# Patient Record
Sex: Female | Born: 1980 | Race: White | Hispanic: Yes | Marital: Married | Smoking: Never smoker
Health system: Southern US, Community
[De-identification: ages and names within clinical notes are randomized; demographics above are authoritative.]

## PROBLEM LIST (undated history)

## (undated) DIAGNOSIS — E039 Hypothyroidism, unspecified: Secondary | ICD-10-CM

## (undated) DIAGNOSIS — I10 Essential (primary) hypertension: Secondary | ICD-10-CM

## (undated) DIAGNOSIS — Z9889 Other specified postprocedural states: Secondary | ICD-10-CM

## (undated) DIAGNOSIS — R519 Headache, unspecified: Secondary | ICD-10-CM

## (undated) DIAGNOSIS — M549 Dorsalgia, unspecified: Secondary | ICD-10-CM

## (undated) DIAGNOSIS — D179 Benign lipomatous neoplasm, unspecified: Secondary | ICD-10-CM

## (undated) DIAGNOSIS — R112 Nausea with vomiting, unspecified: Secondary | ICD-10-CM

## (undated) DIAGNOSIS — M5126 Other intervertebral disc displacement, lumbar region: Secondary | ICD-10-CM

## (undated) DIAGNOSIS — O09529 Supervision of elderly multigravida, unspecified trimester: Secondary | ICD-10-CM

## (undated) DIAGNOSIS — E282 Polycystic ovarian syndrome: Secondary | ICD-10-CM

## (undated) DIAGNOSIS — Z1589 Genetic susceptibility to other disease: Secondary | ICD-10-CM

## (undated) DIAGNOSIS — E079 Disorder of thyroid, unspecified: Secondary | ICD-10-CM

## (undated) DIAGNOSIS — E7212 Methylenetetrahydrofolate reductase deficiency: Secondary | ICD-10-CM

## (undated) HISTORY — DX: Essential (primary) hypertension: I10

## (undated) HISTORY — DX: Dorsalgia, unspecified: M54.9

## (undated) HISTORY — DX: Hypothyroidism, unspecified: E03.9

## (undated) HISTORY — PX: BREAST BIOPSY: SHX20

## (undated) HISTORY — DX: Benign lipomatous neoplasm, unspecified: D17.9

## (undated) HISTORY — PX: CHOLECYSTECTOMY: SHX55

## (undated) HISTORY — DX: Headache, unspecified: R51.9

## (undated) HISTORY — DX: Nausea with vomiting, unspecified: R11.2

## (undated) HISTORY — PX: PR MASTECTOMY PARTIAL: 19301

## (undated) HISTORY — DX: Other specified postprocedural states: Z98.890

## (undated) HISTORY — PX: ABDOMINAL SURGERY: SHX537

## (undated) HISTORY — DX: Other intervertebral disc displacement, lumbar region: M51.26

## (undated) HISTORY — DX: Genetic susceptibility to other disease: Z15.89

## (undated) HISTORY — DX: Polycystic ovarian syndrome: E28.2

## (undated) HISTORY — DX: Methylenetetrahydrofolate reductase deficiency: E72.12

## (undated) NOTE — ED Provider Notes (Signed)
Formatting of this note is different from the original.  Images from the original note were not included.     [facility] EMERGENCY DEPARTMENT     Chief Complaint   Abdominal Pain (RUQ - started 24 hrs ago (gets worse after eating))      History of Present Illness   Peggy Kennedy is a 65 y.o. female who presents to the ED with a chief complaint of right upper quadrant abdominal pain that started yesterday after eating.  Patient states her pain was fairly constant throughout the day with some associated nausea vomiting patient did have some episode of loose stool as well.  Patient was today after eating about 5 hours prior to arrival to ED pain got much worse and came to ED for further evaluation patient states that gallbladder disease does run in her family no other pain or complaints.     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  Social History     Social History Narrative    Not on file     Social History     Tobacco Use    Smoking status: Never    Smokeless tobacco: Never   Substance Use Topics    Alcohol use: Not Currently    Drug use: Never       Review of Systems   Review of Systems   Constitutional:  Negative for chills and fever.   HENT:  Negative for congestion and sore throat.    Respiratory:  Negative for cough and shortness of breath.    Cardiovascular:  Negative for chest pain and palpitations.   Gastrointestinal:  Positive for abdominal pain, nausea and vomiting.   Genitourinary:  Negative for dysuria and frequency.   Musculoskeletal:  Negative for back pain.   Neurological:  Negative for dizziness and weakness.   All other systems reviewed and are negative.      Physical Exam   Vitals:    08/08/22 2041   BP: (!) 168/107   Patient Position: Sitting   Pulse: 77   Resp: 19   Temp: 36.6 C (97.8 F)    TempSrc: Skin   SpO2: 99%   Weight: 65.8 kg (145 lb)   Height: 1.727 m (5\' 8" )     Physical Exam  Vitals and nursing note reviewed.   Constitutional:       General: She is not in acute distress.  HENT:      Head: Normocephalic and atraumatic.      Right Ear: External ear normal.      Left Ear: External ear normal.      Nose: Nose normal.   Eyes:      Extraocular Movements: Extraocular movements intact.      Conjunctiva/sclera: Conjunctivae normal.   Cardiovascular:      Rate and Rhythm: Normal rate and regular rhythm.   Pulmonary:      Effort: Pulmonary effort is normal. No respiratory distress.   Abdominal:      General: Abdomen is flat. There is no distension.      Tenderness: There is abdominal tenderness.      Comments: Right upper quadrant tenderness to palpation   Musculoskeletal:  General: No signs of injury. Normal range of motion.      Cervical back: Normal range of motion.   Skin:     General: Skin is warm.   Neurological:      General: No focal deficit present.      Mental Status: She is alert.      Coordination: Coordination normal.         Procedures   Procedures  No notes on file      Medical Decision Making   LABORATORY EVALUATION:  Results for orders placed or performed during the hospital encounter of 08/08/22   CBC with Differential   Result Value Ref Range    WBC 8.2 3.7 - 11.0 thou/mcL    RBC 4.26 4.01 - 4.90 million/mcL    Hemoglobin 12.2 (L) 12.4 - 14.9 G/DL    Hematocrit 16.1 09.6 - 47.9 %    MCV 84.0 (L) 87.0 - 98.0 FL    MCH 28.6 27.0 - 33.0 PG    MCHC 34.1 31.0 - 37.0 gm/dL    Platelets 045 409 - 400 K/UL    Neutrophils Percent 65.5 50.0 - 70.0 %    Absolute Neutrophil Count 5.35 1.50 - 7.50 thou/mcL    RDW-SD 40.1 36 - 47 fL    MPV 10.4 8.9 - 11.0 FL    Nucleated RBC Percent 0 0 %    Nucleated RBC Abs 0.000   K/UL    Lymphocytes Percent 27.7 25.0 - 40.0 %    Monocytes Percent 5.5 4.0 - 12.0 %    Eosinophils Percent 0.7 (L) 1.0 - 6.0 %    Basophils Percent 0.4 0.0 - 2.0 %     Lymphocytes Abs 2.3 thou/mcL    Eosinophils Absolute 0.06 0.00 - 0.50 K/UL    Monos Abs 0.5 0.1 - 0.8 thou/mcL    Basophils Absolute 0.0 0.0 - 0.2 thou/mcL    Ig, Absolute 0.020 0.001 - 0.031 thou/mcL    Ig, Percent 0.20 0.01 - 0.42 %   Comprehensive metabolic panel   Result Value Ref Range    Sodium 140 136 - 145 mmol/L    Potassium 3.5 3.4 - 4.8 mmol/L    Chloride 109 (H) 98 - 107 MMOL/L    CO2 22 22 - 29 MMOL/L    Bun 10 7 - 19 mg/dL    Creatinine 0.7 0.6 - 1.1 mg/dL    Glucose 93 74 - 99 mg/dL    Calcium 9.1 8.5 - 81.1 mg/dL    Total Protein 6.5 6.0 - 8.3 g/dL    Albumin 3.7 3.2 - 5.2 g/dL    Total Bilirubin 0.2 (L) 0.3 - 1.2 MG/DL    Alk Phos 41 40 - 914 U/L    Ast 15 <35 U/L    Alt 12 <55 U/L    Anion Gap 9 5 - 15 mmol/L    est GFRcr 110 >=60 mL/min/1.40m2   Urinalysis, with reflex culture (Urine, Midstream, Clean Catch)    Specimen: Urine, Midstream, Clean Catch   Result Value Ref Range    Color Urine Yellow Yellow    Appearance Clear Clear    Specific Gravity Urine 1.025 1.005 - 1.030    PH Urine 6.0 5.0 - 8.0    Protein Urine Trace (A) Negative mg/dL    Glucose UA Negative Negative mg/dL    Ketone Trace (A) Negative    Bilirubin Urine Negative Negative    Blood Urine Negative Negative    Nitrite  Negative Negative    Urobilinogen 1.0 0.2 - 1.0 EU    Leukocytes Esterase Negative Negative   Lipase   Result Value Ref Range    Lipase 56 8 - 78 U/L   Urine HCG Qualitative   Result Value Ref Range    Urine Qualitative HCG Negative Negative, See note     IMAGING:  US GALLBLADDER    (Results Pending)     US GALLBLADDER    (Results Pending)     MEDICATIONS RECEIVED IN ED:  Medications - No data to display    MDM:  History as above patient normal vital signs slightly hypertensive otherwise nontoxic no acute distress abdomen is soft tenderness to palpation in the right upper quadrant positive Murphy sign.  Patient was having some postprandial abdominal pain with some nausea and vomiting.  History of gallbladder  disease.  Will get CBC BMP check for leukocytosis kidney dysfunction LFT the patient biliary dysfunction ultrasound right upper quadrant will reassess.     ED Course and Updates   ED Course as of 08/09/22 0123   Sat Aug 08, 2022   2204 EKG:  02/28/2006: Normal sinus rhythm rate of 75 normal interval no ischemic changes [TT]   Sun Aug 09, 2022   0122 Patient left prior to ultrasound resulting, however patient does states she feels much better states she will be able to follow up on her MyChart and will be able to go over the results with her primary doctor also referral to General surgery included case needed.  Patient states she feels much better at this point I think it is reasonable to go okay [place] close PCP follow-up next days return to ED with any new worsening symptoms [TT]     ED Course User Index  [TT] Apolinar Junes, DO       Disposition and Diagnosis   DISPOSITION:  Discharged [1]    DIAGNOSIS:  1. Abdominal pain, right upper quadrant    2. Biliary colic      MEDICATIONS:  New Prescriptions    No medications on file     FOLLOW-UP:  Mariann Laster  [address]  [address] [place] [address]  [phone]    Call in 1 [place]  [address] [facility]  [address]  [place] [address]  [phone]    Call in 1 day        Attestation   Electronically signed by Apolinar Junes, DO.       Apolinar Junes, DO  08/09/22 0123    Electronically signed by Apolinar Junes, DO at 08/09/2022  1:23 AM EDT

## (undated) NOTE — ED Notes (Signed)
Formatting of this note might be different from the original.  Pt refusing vitals. She states that she is just ready to leave.     Sinclair Ship, RN  08/29/16 2116    Electronically signed by Sinclair Ship, RN at 08/29/2016  9:16 PM EDT

## (undated) NOTE — ED Notes (Signed)
Formatting of this note might be different from the original.  Patient expressing wanting to leave hospital. MD consulted and states will go see patient to discuss staying for [place] results or signing out AMA.  Electronically signed by Katharine Look, RN at 08/09/2022  1:13 AM EDT

## (undated) NOTE — ED Provider Notes (Signed)
Formatting of this note is different from the original.  eMERGENCY dEPARTMENT eNCOUnter      CHIEF COMPLAINT    Chief Complaint   Patient presents with   ? Vaginal Bleeding-Pregnant     pt is [redacted] weeks pregnant c/o vaginal bleeding     HPI    Peggy Kennedy is a 7 y.o. female who presents with Vaginal bleeding onset today. Patient is currently [redacted] weeks pregnant has had ultrasound to this 2 days ago which was normal is currently on Lovenox for deep vein thrombosis. Patient denies any abdominal cramping denies any fever or chills diarrhea no constipation denies any recent medication changes.    PAST MEDICAL HISTORY    Past Medical History:   Diagnosis Date   ? DVT (deep vein thrombosis) in pregnancy [place])    ? Thyroid disease      SURGICAL HISTORY    History reviewed. No pertinent surgical history.    CURRENT MEDICATIONS    No current facility-administered medications for this encounter.   No current outpatient prescriptions on file.    ALLERGIES    No Known Allergies    FAMILY HISTORY    No family history on file.    SOCIAL HISTORY    Social History     Social History   ? Marital status: N/A     Spouse name: N/A   ? Number of children: N/A   ? Years of education: N/A     Social History Main Topics   ? Smoking status: Never Smoker   ? Smokeless tobacco: None   ? Alcohol use No   ? Drug use: No   ? Sexual activity: Not Asked     Other Topics Concern   ? None     Social History Narrative   ? None     REVIEW OF SYSTEMS    Constitutional:  Denies fever, chills, weight loss or weakness.    Eyes:  Denies photophobia or discharge.    HENT:  Denies sore throat or ear pain.    Respiratory:  Denies cough or shortness of breath.    Cardiovascular:  Denies chest pain, palpitations or swelling.    GI:  Denies abdominal pain, nausea, vomiting, or diarrhea.    Musculoskeletal:  Denies back pain.    Skin:  Denies rash.    Neurologic:  Denies headache, focal weakness or sensory changes.    Endocrine:  Denies polyuria or polydipsia.     Lymphatic:  Denies swollen glands.    See HPI for further details.    PHYSICAL EXAM    VITAL SIGNS: There were no vitals taken for this visit.  Constitutional:  Well developed, well nourished, no acute distress, non-toxic appearance   Eyes:  PERRL, conjunctiva normal   HENT:  Atraumatic, external ears normal, nose normal, oropharynx moist, no pharyngeal exudates. Neck- normal range of motion, no tenderness, supple   Respiratory:  No respiratory distress, normal breath sounds, no rales, no wheezing   Cardiovascular:  Normal rate, normal rhythm, no murmurs, no gallops, no rubs   GI:  Soft, nondistended, normal bowel sounds, nontender, no organomegaly, no mass, no rebound, no guarding   GU:  No costovertebral angle tenderness   Musculoskeletal:  No edema, no tenderness, no deformities. Back- no tenderness  Integument:  Well hydrated, no rash   Lymphatic:  No lymphadenopathy noted   Neurologic:  Alert & oriented x 3, CN 2-12 normal, normal motor function, normal sensory function, no focal deficits noted  Psychiatric:  Speech and behavior anxious     ED COURSE & MEDICAL DECISION MAKING    Pertinent labs & imaging studies reviewed. (See chart for details)  Recent Results (from the past 10 hour(s))   CBC with Differential    Collection Time: 08/29/16  7:37 PM   Result Value Ref Range    WBC Count 10.8 (H) 4.0 - 10.0 K/uL    RBC Count 4.55 3.93 - 5.22 M/uL    Hemoglobin 12.8 11.2 - 15.7 g/dL    Hematocrit 01.0 27.2 - 44.9 %    MCV 85.5 79.4 - 94.8 fL    MCH 28.1 25.6 - 32.2 pg    MCHC 32.9 32.2 - 36.5 g/dL    RDW 53.6 64.4 - 03.4 %    Platelets 227 163 - 369 K/uL    MPV 10.2 9.4 - 12.4 fL    Abs NRBC 0.01 0.00 - 0.01 K/uL    Neutrophils 66.5 %    Lymphs 25.7 %    Monocytes 6.1 %    Eosinophils 0.7 %    Basophils 0.3 %    Immature Grans 0.7 %    Nucleated RBC 0.0 0.0 - 0.2 per 100 WBC's    Abs Neutrophils 7.16 (H) 1.60 - 6.10 K/uL    Abs Lymphs 2.77 1.20 - 3.70 K/uL    Abs Monos 0.66 0.20 - 0.80 K/uL    Abs EOS 0.07 0.00  - 0.50 K/uL    Abs Baso 0.03 0.00 - 0.10 K/uL    Abs Immature Grans 0.07 (H) 0.00 - 0.03 K/uL    ANC (Auto) 7.16 K/uL    Diff Type AUTO    Basic Metabolic Profile (BMP): Glucose, BUN, CO2-, Na+, K+, Cl-, Ca+, SCr, Anion Gap    Collection Time: 08/29/16  7:37 PM   Result Value Ref Range    Sodium 141 136 - 145 mmol/L    Potassium 3.9 3.5 - 5.1 mmol/L    Chloride 104 98 - 107 mmol/L    CO2 25 21 - 32 mmol/L    Anion Gap 12     Creatinine 0.72 0.55 - 1.02 mg/dL    GFR, Estimated >74 QV/ZDG/3.87F6    BUN 12 7 - 18 mg/dL    Glucose 79 74 - 433 mg/dL    Calcium 9.3 8.5 - 29.5 mg/dL   ABO Grouping and Rh Typing    Collection Time: 08/29/16  7:37 PM   Result Value Ref Range    ABORh A Positive    PT/INR    Collection Time: 08/29/16  7:37 PM   Result Value Ref Range    Prothrombin Time <9.9 (L) 9.9 - 11.8 Seconds    INR <0.94 (L) 0.94 - 1.12   (HCG), Urine, Qualitative - [address] ED Only    Collection Time: 08/29/16  7:37 PM   Result Value Ref Range    Pregnancy Urine Positive (A) Negative   Urinalysis, Reflex with Scope    Collection Time: 08/29/16  7:37 PM   Result Value Ref Range    Color, Ur Straw (A) Colorless,Light Yellow,Yellow    Clarity, Ur Clear Clear    Glucose, Ur Negative Negative mg/dL    Bilirubin, Ur Negative Negative    Ketones, Ur Negative Negative mg/dL    Urine Specific Gravity 1.014 1.001 - 1.035    Blood, UA Moderate (A) Negative    pH, Ur 7.0 5.0 - 9.0    Protein, Ur Negative Negative mg/dL  Urine Urobilinogen <2.0 <2.0,1.0,0.2 mg/dL    Nitrite, Ur Negative Negative    Leukocytes, Urine Negative Negative    RBCs 21 (H) 0 - 3 /HPF    Leukocytes 2 0 - 3 /HPF    Sq. Epith. Cells 1 0 - 5 /HPF    Mucus Rare (A) None Seen /LPF     Ultrasound Ob, Limited With Tv    Result Date: 08/29/2016  OBSTETRICAL ULTRASOUND: COMPARISON:None Transabdominal and transvaginal imaging was performed. A single living intrauterine pregnancy is identified. Based on crown-rump length the estimated age is 13 weeks 3 days +/- 1  week 1 day. This gives a estimated dated delivery 03/03/2017. Fetal heart activity is documented at 165 BPM. There is a large subchorionic hemorrhage measuring 7.9 x 2.4 x 6.6 cm. Neither ovary is identified. No free fluid is seen.     IMPRESSION: SINGLE  INTRAUTERINE PREGNANCY AS DETAILED ABOVE Dictated By: Salomon Mast, MD 08/29/2016 8:21 PM  Electronically Signed by: Salomon Mast, MD 08/29/2016 8:21 PM    FINAL IMPRESSION    Subchorionic hemorrhage  Threatened miscarriage    Patient is currently on Lovenox I talked with her OB/GYN office Dr. Tawanna Cooler Mesinger who is comfortable with the patient continuing on Lovenox at her current dose which is 40 mg per day. I talked to the patient about on medication including bleeding precautions and advised at this time follow her back up on Monday at her OB/GYN office.    Portions of this note may be dictated using Dragon Naturally Speaking voice recognition software. Variances in spelling and vocabulary are possible and unintentional. Not all errors are caught/corrected. Please notify the There[facility] Parkin if any discrepancies are noted or if the meaning of any statement is not clear.        Leone Brand, DO  08/29/16 2056    Electronically signed by Leone Brand, DO at 08/29/2016  8:56 PM EDT

---

## 2008-02-10 DIAGNOSIS — E039 Hypothyroidism, unspecified: Secondary | ICD-10-CM

## 2008-02-10 HISTORY — DX: Hypothyroidism, unspecified: E03.9

## 2012-01-28 DIAGNOSIS — M5126 Other intervertebral disc displacement, lumbar region: Secondary | ICD-10-CM | POA: Insufficient documentation

## 2014-11-26 ENCOUNTER — Encounter (HOSPITAL_COMMUNITY): Payer: Self-pay | Admitting: Emergency Medicine

## 2014-11-26 ENCOUNTER — Emergency Department (HOSPITAL_COMMUNITY)
Admission: EM | Admit: 2014-11-26 | Discharge: 2014-11-26 | Disposition: A | Discharge status: Home / Self Care | Payer: 59 | Attending: Emergency Medicine | Admitting: Emergency Medicine

## 2014-11-26 DIAGNOSIS — Y9289 Other specified places as the place of occurrence of the external cause: Secondary | ICD-10-CM | POA: Insufficient documentation

## 2014-11-26 DIAGNOSIS — Y9389 Activity, other specified: Secondary | ICD-10-CM | POA: Diagnosis not present

## 2014-11-26 DIAGNOSIS — S6992XA Unspecified injury of left wrist, hand and finger(s), initial encounter: Secondary | ICD-10-CM | POA: Diagnosis present

## 2014-11-26 DIAGNOSIS — Z8639 Personal history of other endocrine, nutritional and metabolic disease: Secondary | ICD-10-CM | POA: Diagnosis not present

## 2014-11-26 DIAGNOSIS — S61217A Laceration without foreign body of left little finger without damage to nail, initial encounter: Secondary | ICD-10-CM | POA: Insufficient documentation

## 2014-11-26 DIAGNOSIS — W260XXA Contact with knife, initial encounter: Secondary | ICD-10-CM | POA: Insufficient documentation

## 2014-11-26 DIAGNOSIS — Z23 Encounter for immunization: Secondary | ICD-10-CM | POA: Insufficient documentation

## 2014-11-26 DIAGNOSIS — Y999 Unspecified external cause status: Secondary | ICD-10-CM | POA: Diagnosis not present

## 2014-11-26 DIAGNOSIS — IMO0002 Reserved for concepts with insufficient information to code with codable children: Secondary | ICD-10-CM

## 2014-11-26 HISTORY — DX: Disorder of thyroid, unspecified: E07.9

## 2014-11-26 MED ORDER — TETANUS-DIPHTH-ACELL PERTUSSIS 5-2.5-18.5 LF-MCG/0.5 IM SUSP
0.5000 mL | Freq: Once | INTRAMUSCULAR | Status: AC
  Administered 2014-11-26: 0.5 mL via INTRAMUSCULAR
  Filled 2014-11-26: qty 0.5

## 2014-11-26 MED ORDER — LIDOCAINE HCL 2 % IJ SOLN
15.0000 mL | Freq: Once | INTRAMUSCULAR | Status: AC
  Administered 2014-11-26: 15 mL via INTRADERMAL
  Filled 2014-11-26: qty 20

## 2014-11-26 MED ORDER — BACITRACIN ZINC 500 UNIT/GM EX OINT
1.0000 "application " | TOPICAL_OINTMENT | Freq: Two times a day (BID) | CUTANEOUS | Status: DC
  Administered 2014-11-26: 1 via TOPICAL

## 2014-11-26 NOTE — ED Notes (Signed)
PA at bedside.

## 2014-11-26 NOTE — ED Notes (Signed)
Pt reports she cut in between ring finger and pinky finger with knife. Bleeding controlled.

## 2014-11-26 NOTE — ED Provider Notes (Signed)
CSN: [id number]     Arrival date & time 11/26/14  2026 History  By signing my name below, I, Abigail Kelley, attest that this documentation has been prepared under the direction and in the presence of Abigail Locket, PA-C Electronically Signed: Erling Kelley, ED Scribe. 11/27/2014. 11:14 AM.    Chief Complaint  Patient presents with  . Extremity Laceration    The history is provided by the patient. No language interpreter was used.    HPI Comments: Abigail Kelley is a 34 y.o. female who presents to the Emergency Department complaining of a laceration between her left 4th and 5th finger by mechanism of knife that occurred 45 minutes. She reports associated 8/10 pain to the site of the wound. The bleeding is currently controlled. She states she washed the area with water and peroxide after the injury. Pt is not currently on any anticoagulants. She did not take any meds PTA. Pt is unsure of her tetanus vaccination status. She denies any other complaints at this time.   Past Medical History  Diagnosis Date  . Thyroid disease     hypothyroid.    Past Surgical History  Procedure Laterality Date  . Cesarean section     No family history on file. Social History  Substance Use Topics  . Smoking status: Never Smoker   . Smokeless tobacco: None  . Alcohol Use: No   OB History    No data available     Review of Systems  Skin: Positive for wound.  All other systems reviewed and are negative.     Allergies  Review of patient's allergies indicates no known allergies.  Home Medications   Prior to Admission medications   Not on File   Triage Vitals: BP 140/106 mmHg  Pulse 79  Temp(Src) 98.8 F (37.1 C) (Oral)  Resp 14  Ht 5\' 9"  (1.753 m)  Wt 156 lb 7 oz (70.96 kg)  BMI 23.09 kg/m2  SpO2 98%  LMP 11/14/2014  Physical Exam  Constitutional: She is oriented to person, place, and time. She appears well-developed and well-nourished. No distress.  Awake, alert,  nontoxic appearance.  HENT:  Head: Normocephalic and atraumatic.  Eyes: Conjunctivae and EOM are normal. Right eye exhibits no discharge. Left eye exhibits no discharge.  Neck: Neck supple. No tracheal deviation present.  Cardiovascular: Normal rate and intact distal pulses.   Pulmonary/Chest: Effort normal. No respiratory distress. She exhibits no tenderness.  Abdominal: Soft. There is no tenderness. There is no rebound.  Musculoskeletal: Normal range of motion. She exhibits no tenderness.  Baseline ROM, no obvious new focal weakness.  Neurological: She is alert and oriented to person, place, and time.  Mental status and motor strength appears baseline for patient and situation. Sensation intact  Skin: Skin is warm and dry. No rash noted.  1 cm linear laceration left pinky finger, medial aspect proximal base of MCP. Space bw ring and little fingers. Hemostasis achieved PTA. Pulses intact, brisk cap refill.  Psychiatric: She has a normal mood and affect. Her behavior is normal.  Nursing note and vitals reviewed.   ED Course  Procedures (including critical care time)  DIAGNOSTIC STUDIES: Oxygen Saturation is 98% on RA, normal by my interpretation.    COORDINATION OF CARE:  9:06 PM- will order tdap booster injection.  Pt advised of plan for treatment and pt agrees.  9:22 PM LACERATION REPAIR PROCEDURE NOTE The patient's identification was confirmed and consent was obtained. This procedure was performed by Abigail Locket, PA-C  at 9:19 PM. Site: linear laceration left pinky finger, medial aspect proximal base of MCP Sterile procedures observed Anesthetic used (type and amt): 2% lidocaine w/o epi Suture type/size:4-0 prolene Length:1 cm # of Sutures: 2 Technique: simple interrupted Complexity: compled Antibx ointment applied Tetanus ordered Site anesthetized, irrigated with NS, explored without evidence of foreign body, wound well approximated, site covered with dry,  sterile dressing.  Patient tolerated procedure well without complications. Instructions for care discussed verbally and patient provided with additional written instructions for homecare and f/u.  NERVE BLOCK Performed by: Abigail Kelley Consent: Verbal consent obtained. Required items: required blood products, implants, devices, and special equipment available Time out: Immediately prior to procedure a "time out" was called to verify the correct patient, procedure, equipment, support staff and site/side marked as required.  Indication: Pain  Nerve block body site: Left hand digit   Preparation: Patient was prepped and draped in the usual sterile fashion. Needle gauge: 24 G Location technique: anatomical landmarks  Local anesthetic: Lidocaine   Anesthetic total: 4 ml  Outcome: pain improved Patient tolerance: Patient tolerated the procedure well with no immediate complications.    Labs Review Labs Reviewed - No data to display  Imaging Review No results found. I have personally reviewed and evaluated these images and lab results as part of my medical decision-making.   EKG Interpretation None     Filed Vitals:   11/26/14 2036 11/26/14 2145  BP: 140/106 128/91  Pulse: 79 77  Temp: 98.8 F (37.1 C) 98.9 F (37.2 C)  TempSrc: Oral Oral  Resp: 14 18  Height: 5\' 9"  (1.753 m)   Weight: 156 lb 7 oz (70.96 kg)   SpO2: 98% 99%    MDM  Abigail Kelley is a 34 y.o. female presents for lac to L pinky finger/web space occurred 2 hrs ago. Tdap updated in ED. NVI. FROM of all digits. Brisk cap refill. Lac repaired at bedside after irrigation, topical abx and dressing. Return in 10-14 days for suture removal. Hygiene and return precautions given. Pt appears well, nontoxic with normal VS. Appropriate for DC. F/u PCP as needed. Final diagnoses:  Laceration    I personally performed the services described in this documentation, which was scribed in my presence. The  recorded information has been reviewed and is accurate.    Abigail Locket, PA-C 11/27/14 [place], MD 11/27/14 [phone]

## 2014-11-26 NOTE — Discharge Instructions (Signed)
Please return to ED in 10-14 days and ordered have sutures removed. Return sooner for any sign of infection including fevers, chills, worsening tenderness in the area, increased redness or swelling, cloudy discharge.

## 2014-12-09 ENCOUNTER — Emergency Department (HOSPITAL_COMMUNITY): Admission: EM | Admit: 2014-12-09 | Discharge: 2014-12-09 | Discharge status: Absent without leave | Payer: 59

## 2015-03-28 ENCOUNTER — Encounter: Payer: Self-pay | Admitting: Obstetrics and Gynecology

## 2015-04-05 ENCOUNTER — Encounter: Payer: Self-pay | Admitting: Obstetrics and Gynecology

## 2015-04-11 ENCOUNTER — Ambulatory Visit (INDEPENDENT_AMBULATORY_CARE_PROVIDER_SITE_OTHER): Payer: 59 | Admitting: Obstetrics and Gynecology

## 2015-04-11 ENCOUNTER — Encounter: Payer: Self-pay | Admitting: Obstetrics and Gynecology

## 2015-04-11 VITALS — BP 116/74 | HR 76 | Resp 16 | Ht 67.0 in | Wt 154.0 lb

## 2015-04-11 DIAGNOSIS — Z0000 Encounter for general adult medical examination without abnormal findings: Secondary | ICD-10-CM

## 2015-04-11 DIAGNOSIS — E538 Deficiency of other specified B group vitamins: Secondary | ICD-10-CM

## 2015-04-11 DIAGNOSIS — R7989 Other specified abnormal findings of blood chemistry: Secondary | ICD-10-CM

## 2015-04-11 DIAGNOSIS — Z862 Personal history of diseases of the blood and blood-forming organs and certain disorders involving the immune mechanism: Secondary | ICD-10-CM

## 2015-04-11 DIAGNOSIS — Z1589 Genetic susceptibility to other disease: Secondary | ICD-10-CM

## 2015-04-11 DIAGNOSIS — Z01419 Encounter for gynecological examination (general) (routine) without abnormal findings: Secondary | ICD-10-CM | POA: Diagnosis not present

## 2015-04-11 LAB — COMPREHENSIVE METABOLIC PANEL
ALBUMIN: 3.9 g/dL (ref 3.6–5.1)
ALK PHOS: 34 U/L (ref 33–115)
ALT: 24 U/L (ref 6–29)
AST: 20 U/L (ref 10–30)
BILIRUBIN TOTAL: 0.5 mg/dL (ref 0.2–1.2)
BUN: 9 mg/dL (ref 7–25)
CALCIUM: 9.1 mg/dL (ref 8.6–10.2)
CO2: 24 mmol/L (ref 20–31)
Chloride: 104 mmol/L (ref 98–110)
Creat: 0.86 mg/dL (ref 0.50–1.10)
GLUCOSE: 73 mg/dL (ref 65–99)
Potassium: 4 mmol/L (ref 3.5–5.3)
Sodium: 139 mmol/L (ref 135–146)
TOTAL PROTEIN: 6.9 g/dL (ref 6.1–8.1)

## 2015-04-11 LAB — POCT URINALYSIS DIPSTICK
Bilirubin, UA: NEGATIVE
Glucose, UA: NEGATIVE
Ketones, UA: NEGATIVE
Leukocytes, UA: NEGATIVE
NITRITE UA: NEGATIVE
PH UA: 5
Protein, UA: NEGATIVE
RBC UA: NEGATIVE
UROBILINOGEN UA: NEGATIVE

## 2015-04-11 LAB — CBC
HEMATOCRIT: 39.2 % (ref 36.0–46.0)
HEMOGLOBIN: 13.3 g/dL (ref 12.0–15.0)
MCH: 28.6 pg (ref 26.0–34.0)
MCHC: 33.9 g/dL (ref 30.0–36.0)
MCV: 84.3 fL (ref 78.0–100.0)
MPV: 10.6 fL (ref 8.6–12.4)
Platelets: 198 10*3/uL (ref 150–400)
RBC: 4.65 MIL/uL (ref 3.87–5.11)
RDW: 13.1 % (ref 11.5–15.5)
WBC: 5.7 10*3/uL (ref 4.0–10.5)

## 2015-04-11 LAB — THYROID PANEL WITH TSH
Free Thyroxine Index: 4.7 — ABNORMAL HIGH (ref 1.4–3.8)
T3 UPTAKE: 30 % (ref 22–35)
T4 TOTAL: 15.6 ug/dL — AB (ref 4.5–12.0)
TSH: 0.13 mIU/L — ABNORMAL LOW

## 2015-04-11 LAB — VITAMIN B12: Vitamin B-12: 451 pg/mL (ref 200–1100)

## 2015-04-11 LAB — HEMOGLOBIN A1C
HEMOGLOBIN A1C: 5.1 % (ref <5.7)
Mean Plasma Glucose: 100 mg/dL (ref <117)

## 2015-04-11 LAB — LIPID PANEL
CHOL/HDL RATIO: 3.2 ratio (ref <=5.0)
CHOLESTEROL: 127 mg/dL (ref 125–200)
HDL: 40 mg/dL — ABNORMAL LOW (ref >=46)
LDL Cholesterol: 68 mg/dL (ref <130)
Triglycerides: 97 mg/dL (ref <150)
VLDL: 19 mg/dL (ref <30)

## 2015-04-11 NOTE — Progress Notes (Signed)
35 y.o. G59P1020 Married Caucasian female here for annual exam.    Wants blood work done.  Hx hypothyroidism and low vit B12.   Told she had PCOS. Had irregular menses and acne. These are controlled on Yaz.  Works for [employer] originally.  Husband and daughter here also.  Daughter Verdis Frederickson 10 years old. Husband is Nature conservation officer and does humanitarian projects.   Did IVF for her pregnancy.  Is heterozygous for MTHFR mutation.   PCP:   None.  Patient's last menstrual period was 04/01/2015 (exact date).          Sexually active: Yes.    The current method of family planning is Yasmin.    Exercising: Yes.    Home exercise routine includes weights, cardio. Smoker:  no  Health Maintenance: Pap:  July 2016 (normal and negative HR HPV - Done in [place]) History of abnormal Pap:  No.   MMG:  July 2016 TDaP:  October 2016 Screening Labs:  urine dip - negative.   Urine today: negative.   reports that she has never smoked. She has never used smokeless tobacco. She reports that she does not drink alcohol or use illicit drugs.  Past Medical History  Diagnosis Date  . Thyroid disease     hypothyroid.   Marland Kitchen PCOS (polycystic ovarian syndrome)   . Herniated cervical disc   . MTHFR mutation [facility])     Past Surgical History  Procedure Laterality Date  . Cesarean section      Current Outpatient Prescriptions  Medication Sig Dispense Refill  . drospirenone-ethinyl estradiol (YASMIN,ZARAH,SYEDA) 3-0.03 MG tablet Take 1 tablet by mouth daily.    Marland Kitchen levothyroxine (SYNTHROID, LEVOTHROID) 125 MCG tablet Take 125 mcg by mouth daily before breakfast.     No current facility-administered medications for this visit.    Family History  Problem Relation Age of Onset  . Hypertension Father   . Breast cancer Maternal Aunt   . Alzheimer's disease Paternal Grandmother     ROS:  Pertinent items are noted in HPI.  Otherwise, a comprehensive ROS was negative.  Exam:   BP 116/74 mmHg  Pulse 76   Resp 16  Ht 5\' 7"  (1.702 m)  Wt 154 lb (69.854 kg)  BMI 24.11 kg/m2  LMP 04/01/2015 (Exact Date)    General appearance: alert, cooperative and appears stated age Head: Normocephalic, without obvious abnormality, atraumatic Neck: no adenopathy, supple, symmetrical, trachea midline and thyroid normal to inspection and palpation Lungs: clear to auscultation bilaterally Breasts: normal appearance, no masses or tenderness, Inspection negative, No nipple retraction or dimpling, No nipple discharge or bleeding, No axillary or supraclavicular adenopathy Heart: regular rate and rhythm Abdomen: soft, non-tender; bowel sounds normal; no masses,  no organomegaly Extremities: extremities normal, atraumatic, no cyanosis or edema Skin: Skin color, texture, turgor normal. No rashes or lesions Lymph nodes: Cervical, supraclavicular, and axillary nodes normal. No abnormal inguinal nodes palpated Neurologic: Grossly normal  Pelvic: External genitalia:  no lesions              Urethra:  normal appearing urethra with no masses, tenderness or lesions              Bartholins and Skenes: normal                 Vagina: normal appearing vagina with normal color and discharge, no lesions              Cervix: no lesions  Pap taken: Yes.   Bimanual Exam:  Uterus:  normal size, contour, position, consistency, mobility, non-tender              Adnexa: normal adnexa and no mass, fullness, tenderness              Rectovaginal: No..  Confirms.              Anus:  normal sphincter tone, no lesions  Chaperone was present for exam.  Assessment:   Well woman visit with normal exam. Hypothyroidism.   On Synthroid. Hx PCOS. On Yaz. Hx low vit B12. MTHFR - heterozygous.  Plan: Yearly mammogram recommended after age 59.  Recommended self breast exam.  Pap and HR HPV as above. Discussed Calcium, Vitamin D, regular exercise program including cardiovascular and weight bearing exercise. Labs performed.   Yes.   Refills given on medications.  No.  The patient and I did a search in Up to Date to discuss MTHFR and the implications to her during her lifetime.  We discussed increased risk of cardiovascular disease but that this is less than other risk factors such as HTN.   We discussed combined oral contraception and that this is not a contraindication for her but that estrogen containing OCPs also increase risk of MI, stroke, DVT, and PE.   She is aware that Yaz and Yasmin have been shown to have increased risks of these events compared to other combined OCPs. She wishes to continue Yaz.  We did discuss the use of Mirena IUD as an alternative and that this would not help to improve her acne.  Follow up annually and prn.   An additional 10 minutes spent in counseling and MTHFR mutation, of which over 50% was spent in counseling.   After visit summary provided.

## 2015-04-11 NOTE — Patient Instructions (Signed)

## 2015-04-12 LAB — VITAMIN D 25 HYDROXY (VIT D DEFICIENCY, FRACTURES): VIT D 25 HYDROXY: 28 ng/mL — AB (ref 30–100)

## 2015-04-13 ENCOUNTER — Encounter: Payer: Self-pay | Admitting: Obstetrics and Gynecology

## 2015-04-13 DIAGNOSIS — Z1589 Genetic susceptibility to other disease: Secondary | ICD-10-CM | POA: Insufficient documentation

## 2015-04-14 ENCOUNTER — Other Ambulatory Visit: Payer: Self-pay | Admitting: Obstetrics and Gynecology

## 2015-04-14 DIAGNOSIS — E039 Hypothyroidism, unspecified: Secondary | ICD-10-CM

## 2015-04-16 ENCOUNTER — Telehealth: Payer: Self-pay | Admitting: Emergency Medicine

## 2015-04-16 LAB — IPS PAP TEST WITH HPV

## 2015-04-16 MED ORDER — LEVOTHYROXINE SODIUM 112 MCG PO TABS: 112.0000 ug | ORAL_TABLET | Freq: Every day | ORAL | Status: DC

## 2015-04-16 NOTE — Telephone Encounter (Signed)
-----   Message from Nunzio Cobbs, MD sent at 04/14/2015  8:56 AM EST ----- Please report results to patient.  Her vit D is slightly low, and I am recommending she start taking vitamin D 1000 IU daily.   Her thyroid panel is indicating she is hyperthyroid and taking too much Synthroid. I am recommending she lower her dosage to Synthroid 112 mcg daily.  #30, RF 2.  Please send to her pharmacy of choice. She needs to come to the office for a lab visit in 6 weeks.  Please schedule.  I will place future orders.  Her HDL cholesterol is low, and she can raise this with exercise.  The cholesterol panel and ratios overall were good.   Her total cholesterol was very low overall.   Her vitamin B12, blood counts, and blood chemistries were normal.   Pap is pending.   Cc - Marisa Sprinkles.

## 2015-04-16 NOTE — Telephone Encounter (Addendum)
Spoke with patient. She is given message from Dr. Quincy Simmonds.  Rx ordered and sent to [facility]. Patient will start on new dose and lab appointment scheduled in 6 weeks. Verbalized understanding of all results and subsequent plan of care.  Patient is requesting refill of her OCP as well. She has some left from [place] but will need refill. Advised will send message to Dr. Quincy Simmonds and return call. Patient agreeable.  Patient advised of negative pap results with negative HPV testing.

## 2015-04-17 MED ORDER — DROSPIRENONE-ETHINYL ESTRADIOL 3-0.03 MG PO TABS: 1.0000 | ORAL_TABLET | Freq: Every day | ORAL | Status: DC

## 2015-04-17 NOTE — Telephone Encounter (Signed)
Call to patient. She confirms she is taking daily Yasmin.  Refills sent until next annual exam.  Patient will call back with any questions or concerns prior to next appointment. Routing to provider for final review. Patient agreeable to disposition. Will close encounter.

## 2015-04-17 NOTE — Telephone Encounter (Signed)
Ok for refills of her OCPs until annual exam is due next year.

## 2015-05-27 ENCOUNTER — Other Ambulatory Visit: Payer: 59

## 2015-05-30 ENCOUNTER — Other Ambulatory Visit: Payer: 59

## 2015-06-07 ENCOUNTER — Other Ambulatory Visit (INDEPENDENT_AMBULATORY_CARE_PROVIDER_SITE_OTHER): Payer: 59

## 2015-06-07 DIAGNOSIS — E039 Hypothyroidism, unspecified: Secondary | ICD-10-CM

## 2015-06-08 ENCOUNTER — Other Ambulatory Visit: Payer: Self-pay | Admitting: Obstetrics and Gynecology

## 2015-06-08 LAB — THYROID PANEL WITH TSH
FREE THYROXINE INDEX: 2.7 (ref 1.4–3.8)
T3 Uptake: 25 % (ref 22–35)
T4, Total: 10.9 ug/dL (ref 4.5–12.0)
TSH: 4.09 mIU/L

## 2015-06-08 MED ORDER — LEVOTHYROXINE SODIUM 112 MCG PO TABS: 112.0000 ug | ORAL_TABLET | Freq: Every day | ORAL | Status: DC

## 2015-06-19 ENCOUNTER — Ambulatory Visit (INDEPENDENT_AMBULATORY_CARE_PROVIDER_SITE_OTHER): Payer: 59 | Admitting: Family Medicine

## 2015-06-19 ENCOUNTER — Encounter: Payer: Self-pay | Admitting: Family Medicine

## 2015-06-19 VITALS — BP 118/83 | HR 80 | Temp 98.4°F | Resp 20 | Ht 67.5 in | Wt 162.2 lb

## 2015-06-19 DIAGNOSIS — E039 Hypothyroidism, unspecified: Secondary | ICD-10-CM | POA: Diagnosis not present

## 2015-06-19 DIAGNOSIS — E038 Other specified hypothyroidism: Secondary | ICD-10-CM | POA: Insufficient documentation

## 2015-06-19 DIAGNOSIS — E063 Autoimmune thyroiditis: Secondary | ICD-10-CM | POA: Insufficient documentation

## 2015-06-19 DIAGNOSIS — D229 Melanocytic nevi, unspecified: Secondary | ICD-10-CM | POA: Diagnosis not present

## 2015-06-19 NOTE — Patient Instructions (Signed)
It was a pleasure meeting you today.  I will refer you to dermatology to establish for yearly skin checks with your family history.  Melanoma Melanoma is a form of skin cancer that begins in melanocytes. Melanocytes are the skin cells that produce pigment. Melanoma starts as a mole on the skin and can spread to other parts of the body. If found early, many cases of melanoma are curable.  CAUSES  The exact cause is unknown.  RISK FACTORS  Spending a lot of time in the sun, under a sunlamp, or in a tanning booth.  Having sunburn that blisters. The more blistering sunburns you have, the higher your risk of melanoma.  Spending time in parts of the world with more intense sunlight.  Living in a hot, sunny climate.  Having fair skin that does not tan easily.  Having had melanoma before.  Having a family history of melanoma.  Having more than 100 skin moles. SIGNS AND SYMPTOMS   ABCDE changes in a mole. ABCDE stands for:   Asymmetry. This means the mole has an irregular shape. It is not round or oval.  Border. This means the mole has an irregular or bumpy border.  Color. This means the mole has multiple colors in it, including brown, black, blue, red, or tan.  Diameter. This means the mole is more than 0.2 in (6 mm) across.  Evolving. This refers to any unusual changes or symptoms in the mole, such as pain, itching, stinging, sensitivity, or bleeding.  A new mole.  Swollen lymph nodes.  Shortness of breath.  Bone pain.  Headache.  Seizures.  Visual problems. DIAGNOSIS  Your health care provider will take a tissue sample from the mole to examine under a microscope (biopsy). The biopsy will reveal whether melanoma has spread to deeper layers of the skin. Your health care provider will also order tests, including:  Blood tests.  Chest X-rays.  A CT scan.  A bone scan. TREATMENT  You will have surgery to remove the cancer. Your lymph nodes may also be removed during  the surgery. If melanoma has spread to other organs, such as the liver, lungs, bone, or brain, you will need additional treatment.  PREVENTION  Melanoma may come back (recur) after it has been treated. Your risk of recurrence is higher if you had thick or ulcerated tumors or patches of tumors. Generally, the more advanced your melanoma, the more likely it will recur. You can help prevent melanoma from recurring by staying out of the sun, especially during peak midafternoon hours. When you are outdoors or in the sun:  Wear long sleeves, a hat, and sunglasses that block UV light when possible.  Apply a sunscreen with an SPF of 30 or higher regularly. Take these precautions on cloudy days and in the winter, even if you will be outdoors for only a short period of time. SEEK MEDICAL CARE IF:  You notice any new growths or changes in your skin.  You have had melanoma removed and you notice a new growth in the same location.   This information is not intended to replace advice given to you by your health care provider. Make sure you discuss any questions you have with your health care provider.   Document Released: 01/26/2005 Document Revised: 02/16/2014 Document Reviewed: 03/22/2013 Elsevier Interactive Patient Education [employer].

## 2015-06-19 NOTE — Progress Notes (Signed)
Patient ID: Abigail Kelley, female   DOB: [birth year], 35 y.o.   MRN: [id number]      Patient ID: Abigail Kelley, female  DOB: [birth year], 35 y.o.   MRN: [id number]  Subjective:  Abigail Kelley is a 35 y.o. female present for establishment of care. All past medical history, surgical history, allergies, family history, immunizations, medications and social history were obtained and entered, in the electronic medical record today. All recent labs, ED visits and hospitalizations within the last year were reviewed. Patient moved from [place] in October 2016, with her husband and daughter. She has established with Dr. Quincy Simmonds as her gynecologist in March. She reports chronic condition of hypothyroid since 2010 and a lumbar disc issue. She does not bring records with her today.  Hypothyroid: Upon establishment with Dr. Quincy Simmonds she had routine physical with labs, which resulted with an over replaced thyroid level. Pt states her dose was decreased to 112 mcg and her repeat levels are normal. TSH now 4.09 after 7-8 weeks of lower dose. Pt states she feels like her thyroid is off because she does not feel as well as she did before. She feels that she knows when her thyroid is abnormal because her nails and skin become dry and brittle. Since the lowering of her dose, she now is experiencing the dry cracked heels and brittle nails again.   Lumbar herniated disc: Pt states she has had an issue since just before her pregnancy with a lumbar disc. She reports having an MRI in [place]. She will bring these records/images for scanning into the system.  Multiple nevi: pt reports having multiple skin lesions on her body. She tries to keep an eye on them and has been followed in the past for monitoring and was told she should go for a "mapping". She does not feel any of them are changing or new, but has concerns over many of them. Her PGF died of melanoma that metastasis to his liver.   Health maintenance:    Colonoscopy: No FHX, routine screen 50 Mammogram: FHX present, has a gynecologists (Dr. Quincy Simmonds).  Cervical cancer screening: Has gynecologists (Dr.Silva). 04/2015 PAP completed.  Immunizations: tdap (2016), flu shot (2016),  Infectious disease screening: HIV/Hep C 2014- completed DEXA:routine screen 72   Past Medical History  Diagnosis Date  . Thyroid disease     hypothyroid.   Marland Kitchen PCOS (polycystic ovarian syndrome)   . MTHFR mutation ([place])   . Lumbar herniated disc    No Known Allergies Past Surgical History  Procedure Laterality Date  . Cesarean section     Family History  Problem Relation Age of Onset  . Hypertension Father   . Breast cancer Maternal Aunt   . Alzheimer's disease Paternal Grandmother   . Breast cancer Paternal Aunt   . Alzheimer's disease Maternal Grandmother   . Lung cancer Maternal Grandfather   . Liver cancer Paternal Grandfather     melenoma metz  . Melanoma Paternal Grandfather    Social History   Social History  . Marital Status: Married    Spouse Name: N/A  . Number of Children: N/A  . Years of Education: N/A   Occupational History  . Not on file.   Social History Main Topics  . Smoking status: Never Smoker   . Smokeless tobacco: Never Used  . Alcohol Use: No  . Drug Use: No  . Sexual Activity: Yes    Birth Control/ Protection: Pill     Comment: Yaz   Other Topics Concern  .  Not on file   Social History Narrative   Married. [name] is her husband. 1 Child Verdis Frederickson).   Moved from [place] (11/2014)   MBA, Human Resources.    Drinks caffeine   Wears her seatbelt, wears her bike helmet   Smoke detector in the home.    Feels safe in her relationships.         Medication List       This list is accurate as of: 06/19/15  5:02 PM.  Always use your most recent med list.               drospirenone-ethinyl estradiol 3-0.03 MG tablet  Commonly known as:  YASMIN 28  Take 1 tablet by mouth daily.     levothyroxine 112 MCG tablet   Commonly known as:  SYNTHROID  Take 1 tablet (112 mcg total) by mouth daily before breakfast.       Recent Results (from the past 2160 hour(s))  POCT Urinalysis Dipstick     Status: Normal   Collection Time: 04/11/15  1:58 PM  Result Value Ref Range   Color, UA yellow    Clarity, UA clear    Glucose, UA neg    Bilirubin, UA neg    Ketones, UA neg    Spec Grav, UA     Blood, UA neg    pH, UA 5.0    Protein, UA neg    Urobilinogen, UA negative    Nitrite, UA neg    Leukocytes, UA Negative Negative  Thyroid Panel With TSH     Status: Abnormal   Collection Time: 04/11/15  2:58 PM  Result Value Ref Range   T4, Total 15.6 (H) 4.5 - 12.0 ug/dL   T3 Uptake 30 22 - 35 %   Free Thyroxine Index 4.7 (H) 1.4 - 3.8   TSH 0.13 (L) mIU/L    Comment:   Reference Range   > or = 20 Years  0.40-4.50   Pregnancy Range First trimester  0.26-2.66 Second trimester 0.55-2.73 Third trimester  0.43-2.91   ** Please note change in unit of measure and reference range(s). **     Lipid panel     Status: Abnormal   Collection Time: 04/11/15  2:58 PM  Result Value Ref Range   Cholesterol 127 125 - 200 mg/dL   Triglycerides 97 <150 mg/dL   HDL 40 (L) >=46 mg/dL   Total CHOL/HDL Ratio 3.2 <=5.0 Ratio   VLDL 19 <30 mg/dL   LDL Cholesterol 68 <130 mg/dL    Comment:   Total Cholesterol/HDL Ratio:CHD Risk                        Coronary Heart Disease Risk Table                                        Men       Women          1/2 Average Risk              3.4        3.3              Average Risk              5.0        4.4           2X  Average Risk              9.6        7.1           3X Average Risk             23.4       11.0 Use the calculated Patient Ratio above and the CHD Risk table  to determine the patient's CHD Risk.   CBC     Status: None   Collection Time: 04/11/15  2:58 PM  Result Value Ref Range   WBC 5.7 4.0 - 10.5 K/uL   RBC 4.65 3.87 - 5.11 MIL/uL   Hemoglobin 13.3 12.0 -  15.0 g/dL   HCT 39.2 36.0 - 46.0 %   MCV 84.3 78.0 - 100.0 fL   MCH 28.6 26.0 - 34.0 pg   MCHC 33.9 30.0 - 36.0 g/dL   RDW 13.1 11.5 - 15.5 %   Platelets 198 150 - 400 K/uL   MPV 10.6 8.6 - 12.4 fL  Comprehensive metabolic panel     Status: None   Collection Time: 04/11/15  2:58 PM  Result Value Ref Range   Sodium 139 135 - 146 mmol/L   Potassium 4.0 3.5 - 5.3 mmol/L   Chloride 104 98 - 110 mmol/L   CO2 24 20 - 31 mmol/L   Glucose, Bld 73 65 - 99 mg/dL   BUN 9 7 - 25 mg/dL   Creat 0.86 0.50 - 1.10 mg/dL   Total Bilirubin 0.5 0.2 - 1.2 mg/dL   Alkaline Phosphatase 34 33 - 115 U/L   AST 20 10 - 30 U/L   ALT 24 6 - 29 U/L   Total Protein 6.9 6.1 - 8.1 g/dL   Albumin 3.9 3.6 - 5.1 g/dL   Calcium 9.1 8.6 - 10.2 mg/dL  VITAMIN D 25 Hydroxy (Vit-D Deficiency, Fractures)     Status: Abnormal   Collection Time: 04/11/15  2:58 PM  Result Value Ref Range   Vit D, 25-Hydroxy 28 (L) 30 - 100 ng/mL    Comment: Vitamin D Status           25-OH Vitamin D        Deficiency                <20 ng/mL        Insufficiency         20 - 29 ng/mL        Optimal             > or = 30 ng/mL   For 25-OH Vitamin D testing on patients on D2-supplementation and patients for whom quantitation of D2 and D3 fractions is required, the QuestAssureD 25-OH VIT D, (D2,D3), LC/MS/MS is recommended: order code [phone] (patients > 2 yrs).   Vitamin B12     Status: None   Collection Time: 04/11/15  2:58 PM  Result Value Ref Range   Vitamin B-12 451 200 - 1100 pg/mL    Comment: ** Please note change in reference range(s). **     Hemoglobin A1c     Status: None   Collection Time: 04/11/15  2:58 PM  Result Value Ref Range   Hgb A1c MFr Bld 5.1 <5.7 %    Comment:  According to the ADA Clinical Practice Recommendations for 2011, when HbA1c is used as a screening test:     >=6.5%   Diagnostic of Diabetes Mellitus            (if abnormal  result is confirmed)   5.7-6.4%   Increased risk of developing Diabetes Mellitus   References:Diagnosis and Classification of Diabetes Mellitus,Diabetes [id number] 1):S62-S69 and Standards of Medical Care in         Diabetes - 2011,Diabetes [id number] (Suppl 1):S11-S61.      Mean Plasma Glucose 100 <117 mg/dL  Pap Test with HP (IPS)     Status: None   Collection Time: 04/11/15  2:59 PM  Result Value Ref Range   COMMENTS: [facility]     Comment: [address], [place] [address] [place] [address] GYN CYTOLOGY REPORT  PATIENT NAME:Abigail Kelley, Abigail Kelley PATHOLOGY#:[id number]: F DOB: 06/15/1980 (Age: 60) MEDICAL RECORD TO:[id number] DOCTOR:Brook Quincy Simmonds, MD DATE OBTAINED:3/2/2017CLIENT:[facility] DATE RECEIVED:3/3/2017OTHER PHYS: DATE SIGNED:04/16/2015 PAP Thinlayer with HPV Final Cytologic Interpretation:       Cervical, ThinLayer with Automated Imaging and Dual Review, CPT 88175      Negative for Intraepithelial Lesions or Malignancy.       ADEQUACY OF SPECIMEN:           Satisfactory for evaluation. Endocervical cells/transformation zone component identified.             NOTE: This Pap test has been evaluated with computer assisted technology.       Electronically signed by: [facility], CT(ASCP), [address], [place], [place], (Med. Dir.: Sandrea Hughs, MD) pgi/3/7/2017The Pap test is a screening mechanism with excellent but not perfect ability to prevent cervical carc inoma.  It has a low, but  significant, diagnostic error rate. The pap test is suboptimal  for detection of glandular lesions.  It should be noted that a negative result does not definitively rule out the presence of disease.Ref: DeMay, RM, The Art and Science of Cytopathology,  [employer], [phone]. HPV Results   High Risk HPV -    Not Detected  A result of "Detected" signifies the presence of one or more high risk types of HPV.  The APTIMA HPV  Assay is an in vitro nucleic acid amplification test for the qualitative detection of E6/E7 viral messenger RNA (mRNA) from 14 high-risk types of  human papillomavirus (HPV) in cervical specimens. The high-risk HPV types detected by the assay include: 16, 18, 31, 33, 35, 39, 45, 51, 52, 56, 58, 59, 66, and 68. APTIMA HPV method will be performed on the [employer].  The APTIMA HPV Assay is designed to enhance existing methods for the detection of cervical disease and should be used in conjunction with clinical informa tion derived from other diagnostic and screening tests, physical examinations, and full medical  history in accordance with appropriate patient management procedures. The APTIMA HPV Assay on ThinPrep(tm) PreservCyt(tm) specimens is FDA approved on the [employer].The APTIMA HPV Assay on SurePath(tm) specimens was developed and its performance characteristics determined by [facility].   It has not been cleared or approved by the U.S. Food and Drug Administration.  The FDA has determined that such clearance or approval is not necessary.  This test is used for clinical purposes.  This laboratory is certified under the [place] (CLIA) as qualified to perform high complexity clinical laboratory testing. Electronically signed by: Lavone Nian, [place](ASCP)  [place], [place], [place] ([place].: Sandrea Hughs) Last Menstrual Period: 04/01/2015  Technical processing performed at [facility], [address]  [place], [place] [address], [place].   Thyroid Panel With TSH     Status: None   Collection Time: 06/07/15  8:47 AM  Result Value Ref Range   T4, Total 10.9 4.5 - 12.0 ug/dL   T3 Uptake 25 22 - 35 %   Free Thyroxine Index 2.7 1.4 - 3.8   TSH 4.09 mIU/L    Comment:   Reference Range   > or = 20 Years  0.40-4.50   Pregnancy Range First trimester  0.26-2.66 Second  trimester 0.55-2.73 Third trimester  0.43-2.91        ROS: Negative, with the exception of above mentioned in HPI  Objective: BP 118/83 mmHg  Pulse 80  Temp(Src) 98.4 F (36.9 C) (Oral)  Resp 20  Ht 5' 7.5" (1.715 m)  Wt 162 lb 4 oz (73.596 kg)  BMI 25.02 kg/m2  SpO2 96%  LMP 05/27/2015 Gen: Afebrile. No acute distress. Nontoxic in appearance, well-developed, well-nourished, female, [place] pleasant.  HENT: AT. Green. Bilateral TM visualized and normal in appearance, normal external auditory canal. MMM, no oral lesions Eyes:Pupils Equal Round Reactive to light, Extraocular movements intact,  Conjunctiva without redness, discharge or icterus. Neck/lymp/endocrine: Supple, No lymphadenopathy CV: RRR 1/6 SM, No edema Chest: CTAB, no wheeze, rhonchi or crackles.  Abd: Soft. NTND. BS present Skin: No rashes, purpura or petechiae. Warm and well-perfused. Multiple irregular shaped nevi over arms, chest, back and abdomen.  Neuro/Msk: Normal gait. PERLA. EOMi. Alert. Oriented x3.   Psych: Normal affect, dress and demeanor. Normal speech. Normal thought content and judgment.  Assessment/plan: Abigail Kelley is a 35 y.o. female present for establishment care. Hypothyroidism, unspecified hypothyroidism type - Last level normal. - continue 112 mcg synthroid, if not feeling improved in 2 months she should follow up and we can repeat her thyroid level. Discussed with her thyroid levels can take 8-12 weeks to regulate after medication change.  Multiple nevi - FH: melanoma with liver metastasis in PGF  - Ambulatory referral to Dermatology  Return in about 1 year (around 06/18/2016) for CPE.  Greater than 45 minutes was spent with patient, greater than 50% of that time was spent face-to-face with patient counseling and coordinating care.  Electronically signed by: Howard Pouch, DO [place]

## 2015-08-29 ENCOUNTER — Encounter: Payer: Self-pay | Admitting: Obstetrics and Gynecology

## 2015-08-29 ENCOUNTER — Ambulatory Visit (INDEPENDENT_AMBULATORY_CARE_PROVIDER_SITE_OTHER): Payer: 59 | Admitting: Obstetrics and Gynecology

## 2015-08-29 ENCOUNTER — Other Ambulatory Visit: Payer: Self-pay | Admitting: Obstetrics and Gynecology

## 2015-08-29 VITALS — BP 120/78 | HR 78 | Resp 16 | Ht 67.5 in | Wt 162.8 lb

## 2015-08-29 DIAGNOSIS — E039 Hypothyroidism, unspecified: Secondary | ICD-10-CM

## 2015-08-29 DIAGNOSIS — Z3169 Encounter for other general counseling and advice on procreation: Secondary | ICD-10-CM

## 2015-08-29 LAB — THYROID PANEL WITH TSH
FREE THYROXINE INDEX: 2.6 (ref 1.4–3.8)
T3 Uptake: 23 % (ref 22–35)
T4 TOTAL: 11.3 ug/dL (ref 4.5–12.0)
TSH: 2.93 m[IU]/L

## 2015-08-29 NOTE — Progress Notes (Signed)
GYNECOLOGY  VISIT   HPI: 35 y.o.   Married  Caucasian  female   [phone] with Patient's last menstrual period was 08/22/2015.   here for family planning.     Hx IVF. Has still 3 frozen blastocysts in [place]. Patient needs to coordinate for the blastocyst transfer to be done in [place] at the end of the year.   Also needs testing 30 days prior to transfer that she is [place] free.   Patient is hypothyroid.  Wants retesting.  Her REI in [place] is recommending her TSH to be around 2.   On OCPs for cycle regulation.  Become irregular soon after stopping the pills.   Living in [place].   GYNECOLOGIC HISTORY: Patient's last menstrual period was 08/22/2015. Contraception:  OCP's Menopausal hormone therapy:  none Last mammogram:  2016 wnl per patient Last pap smear: 04/11/15 WNL neg HR HPV        OB History    Gravida Para Term Preterm AB TAB SAB Ectopic Multiple Living   3 1 1  2  2             Patient Active Problem List   Diagnosis Date Noted  . Hypothyroidism 06/19/2015  . 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 History of MTHFR mutation 04/13/2015    Past Medical History  Diagnosis Date  . Thyroid disease     hypothyroid.   Marland Kitchen PCOS (polycystic ovarian syndrome)   . MTHFR mutation ([place])   . Lumbar herniated disc     Past Surgical History  Procedure Laterality Date  . Cesarean section      Current Outpatient Prescriptions  Medication Sig Dispense Refill  . drospirenone-ethinyl estradiol (YASMIN 28) 3-0.03 MG tablet Take 1 tablet by mouth daily. 3 Package 3  . levothyroxine (SYNTHROID) 112 MCG tablet Take 1 tablet (112 mcg total) by mouth daily before breakfast. 30 tablet 10   No current facility-administered medications for this visit.     ALLERGIES: Review of patient's allergies indicates no known allergies.  Family History  Problem Relation Age of Onset  . Hypertension Father   . Breast cancer Maternal Aunt   . Alzheimer's disease Paternal Grandmother   . Breast  cancer Paternal Aunt   . Alzheimer's disease Maternal Grandmother   . Lung cancer Maternal Grandfather   . Liver cancer Paternal Grandfather     melenoma metz  . Melanoma Paternal Grandfather     Social History   Social History  . Marital Status: Married    Spouse Name: N/A  . Number of Children: N/A  . Years of Education: N/A   Occupational History  . Not on file.   Social History Main Topics  . Smoking status: Never Smoker   . Smokeless tobacco: Never Used  . Alcohol Use: No  . Drug Use: No  . Sexual Activity: Yes    Birth Control/ Protection: Pill     Comment: Yaz   Other Topics Concern  . Not on file   Social History Narrative   Married. [name] is her husband. 1 Child Verdis Frederickson).   Moved from [place] (11/2014)   MBA, Human Resources.    Drinks caffeine   Wears her seatbelt, wears her bike helmet   Smoke detector in the home.    Feels safe in her relationships.        ROS:  Pertinent items are noted in HPI.  PHYSICAL EXAMINATION:    BP 120/78 mmHg  Pulse 78  Resp 16  Ht 5'  7.5" (1.715 m)  Wt 162 lb 12.8 oz (73.846 kg)  BMI 25.11 kg/m2  LMP 08/22/2015    General appearance: alert, cooperative and appears stated age    ASSESSMENT  Desire for fertility.  Hypothyroidism. On Synthroid. Hx PCOS. On Yaz. Hx low vit B12. MTHFR - heterozygous.   PLAN  Check TFTs.  Start PNV.  Stay of combined oral contraceptives.  Will refer to Dr. Kerin Perna for coordination of pre IVF blastocyst transfer in [place] in November.    An After Visit Summary was printed and given to the patient.  ___15___ minutes face to face time of which over 50% was spent in counseling.

## 2015-09-02 ENCOUNTER — Telehealth: Payer: Self-pay

## 2015-09-02 NOTE — Telephone Encounter (Signed)
Call to [place] office. Spoke with Maggie who states she has been trying to contact the patient regarding scheduling an appointment with Dr.Yalcinkaya, but is unable to leave a voicemail. Advised I will contact the patient and provide her the information to contact Dr.Yalcinkaya's office. She is agreeable.  Spoke with patient. Provided information to Dr.Yalcinkaya's office for the patient to return call to schedule her appointment. She is agreeable and will contact Dr.Yalcinkaya's office at this time.  Routing to provider for final review. Patient agreeable to disposition. Will close encounter.

## 2015-09-04 ENCOUNTER — Encounter: Payer: Self-pay | Admitting: Obstetrics and Gynecology

## 2015-09-04 ENCOUNTER — Telehealth: Payer: Self-pay

## 2015-09-04 DIAGNOSIS — E039 Hypothyroidism, unspecified: Secondary | ICD-10-CM

## 2015-09-04 NOTE — Telephone Encounter (Signed)
Spoke with patient. Advised of message as seen below from [place]. Advised free T4 has been added to her lab work and that we will contact her with lab results and further recommendations once results have returned. She is agreeable and verbalizes understanding.  Routing to provider for final review. Patient agreeable to disposition. Will close encounter.

## 2015-09-04 NOTE — Telephone Encounter (Signed)
Please add a free T4 to patient's labs already drawn if possible. Let her know that her total T4 is elevated in the upper normal range.  If we increase the thyroid medication to make the TSH under 2.5, we may actually make her hyperthyroid, which is not what we want either.  I want to look at the free T4 level.

## 2015-09-04 NOTE — Telephone Encounter (Signed)
I have attempted to reach this patient at number provided, [phone]. There was no answer and recording states that the voicemail box has not been set up yet.  Spoke with Myriam Jacobson who has contacted the lab and has added free t4 to patient's labs.

## 2015-09-04 NOTE — Telephone Encounter (Signed)
Telephone encounter created to discuss mychart message with Dr.Silva. 

## 2015-09-04 NOTE — Telephone Encounter (Signed)
Per mychart message from Abigail Kelley patient is to stay on current dose of Synthroid at 112 mcg daily. Patient is concerned about TSH not being below 2.5 with desire pregnancy. Routing to [place] for review and advise.  Visit Follow-Up Question  Message [id number]  From [name] To Nunzio Cobbs, MD Sent 09/04/2015 10:45 AM  Hello! I just wanted to check if we are changing my Thyroid medication to lower a little bit more my TSH, thinking of the future pregnancy as it should be under 2.50.  tks  [place]   Responsible Party   Pool - [facility] Pool No one has taken responsibility for this message.  No actions have been taken on this message.

## 2015-09-05 LAB — T4, FREE: Free T4: 1.2 ng/dL (ref 0.8–1.8)

## 2015-09-10 ENCOUNTER — Encounter: Payer: Self-pay | Admitting: Family Medicine

## 2015-09-10 ENCOUNTER — Ambulatory Visit (INDEPENDENT_AMBULATORY_CARE_PROVIDER_SITE_OTHER): Payer: 59 | Admitting: Family Medicine

## 2015-09-10 VITALS — BP 114/79 | HR 74 | Temp 98.6°F | Resp 20 | Wt 166.2 lb

## 2015-09-10 DIAGNOSIS — M5441 Lumbago with sciatica, right side: Secondary | ICD-10-CM | POA: Diagnosis not present

## 2015-09-10 MED ORDER — TRAMADOL HCL 50 MG PO TABS: 50.0000 mg | ORAL_TABLET | Freq: Three times a day (TID) | ORAL | 0 refills | Status: DC | PRN

## 2015-09-10 MED ORDER — PREDNISONE 20 MG PO TABS: ORAL_TABLET | ORAL | 0 refills | Status: DC

## 2015-09-10 MED ORDER — METHYLPREDNISOLONE ACETATE 80 MG/ML IJ SUSP
80.0000 mg | Freq: Once | INTRAMUSCULAR | Status: AC
  Administered 2015-09-10: 80 mg via INTRAMUSCULAR

## 2015-09-10 MED ORDER — CYCLOBENZAPRINE HCL 5 MG PO TABS: 5.0000 mg | ORAL_TABLET | Freq: Three times a day (TID) | ORAL | 1 refills | Status: DC | PRN

## 2015-09-10 NOTE — Patient Instructions (Signed)
I have called in flexeril ( take at night, if needed and does not make you sedated you can take up to every 8 hours).  Prednisone is also prescribed and start this taper dose tomorrow. The shot covers you for today's dose.  Tramadol is a printed script, because it is a controlled substance. You will need to hand this one to the pharmacy.   Continue heat therapy. I will followup with you in 2-3 weeks if not improving or before then if worsening.   Do not lift. Rest. Start stretches in one week, if able to tolerate only.     Sciatica With Rehab The sciatic nerve runs from the back down the leg and is responsible for sensation and control of the muscles in the back (posterior) side of the thigh, lower leg, and foot. Sciatica is a condition that is characterized by inflammation of this nerve.  SYMPTOMS   Signs of nerve damage, including numbness and/or weakness along the posterior side of the lower extremity.  Pain in the back of the thigh that may also travel down the leg.  Pain that worsens when sitting for long periods of time.  Occasionally, pain in the back or buttock. CAUSES  Inflammation of the sciatic nerve is the cause of sciatica. The inflammation is due to something irritating the nerve. Common sources of irritation include:  Sitting for long periods of time.  Direct trauma to the nerve.  Arthritis of the spine.  Herniated or ruptured disk.  Slipping of the vertebrae (spondylolisthesis).  Pressure from soft tissues, such as muscles or ligament-like tissue (fascia). RISK INCREASES WITH:  Sports that place pressure or stress on the spine (football or weightlifting).  Poor strength and flexibility.  Failure to warm up properly before activity.  Family history of low back pain or disk disorders.  Previous back injury or surgery.  Poor body mechanics, especially when lifting, or poor posture. PREVENTION   Warm up and stretch properly before activity.  Maintain  physical fitness:  Strength, flexibility, and endurance.  Cardiovascular fitness.  Learn and use proper technique, especially with posture and lifting. When possible, have coach correct improper technique.  Avoid activities that place stress on the spine. PROGNOSIS If treated properly, then sciatica usually resolves within 6 weeks. However, occasionally surgery is necessary.  RELATED COMPLICATIONS   Permanent nerve damage, including pain, numbness, tingle, or weakness.  Chronic back pain.  Risks of surgery: infection, bleeding, nerve damage, or damage to surrounding tissues. TREATMENT Treatment initially involves resting from any activities that aggravate your symptoms. The use of ice and medication may help reduce pain and inflammation. The use of strengthening and stretching exercises may help reduce pain with activity. These exercises may be performed at home or with referral to a therapist. A therapist may recommend further treatments, such as transcutaneous electronic nerve stimulation (TENS) or ultrasound. Your caregiver may recommend corticosteroid injections to help reduce inflammation of the sciatic nerve. If symptoms persist despite non-surgical (conservative) treatment, then surgery may be recommended. MEDICATION  If pain medication is necessary, then nonsteroidal anti-inflammatory medications, such as aspirin and ibuprofen, or other minor pain relievers, such as acetaminophen, are often recommended.  Do not take pain medication for 7 days before surgery.  Prescription pain relievers may be given if deemed necessary by your caregiver. Use only as directed and only as much as you need.  Ointments applied to the skin may be helpful.  Corticosteroid injections may be given by your caregiver. These injections should  be reserved for the most serious cases, because they may only be given a certain number of times. HEAT AND COLD  Cold treatment (icing) relieves pain and reduces  inflammation. Cold treatment should be applied for 10 to 15 minutes every 2 to 3 hours for inflammation and pain and immediately after any activity that aggravates your symptoms. Use ice packs or massage the area with a piece of ice (ice massage).  Heat treatment may be used prior to performing the stretching and strengthening activities prescribed by your caregiver, physical therapist, or athletic trainer. Use a heat pack or soak the injury in warm water. SEEK MEDICAL CARE IF:  Treatment seems to offer no benefit, or the condition worsens.  Any medications produce adverse side effects. EXERCISES  RANGE OF MOTION (ROM) AND STRETCHING EXERCISES - Sciatica Most people with sciatic will find that their symptoms worsen with either excessive bending forward (flexion) or arching at the low back (extension). The exercises which will help resolve your symptoms will focus on the opposite motion. Your physician, physical therapist or athletic trainer will help you determine which exercises will be most helpful to resolve your low back pain. Do not complete any exercises without first consulting with your clinician. Discontinue any exercises which worsen your symptoms until you speak to your clinician. If you have pain, numbness or tingling which travels down into your buttocks, leg or foot, the goal of the therapy is for these symptoms to move closer to your back and eventually resolve. Occasionally, these leg symptoms will get better, but your low back pain may worsen; this is typically an indication of progress in your rehabilitation. Be certain to be very alert to any changes in your symptoms and the activities in which you participated in the 24 hours prior to the change. Sharing this information with your clinician will allow him/her to most efficiently treat your condition. These exercises may help you when beginning to rehabilitate your injury. Your symptoms may resolve with or without further involvement  from your physician, physical therapist or athletic trainer. While completing these exercises, remember:   Restoring tissue flexibility helps normal motion to return to the joints. This allows healthier, less painful movement and activity.  An effective stretch should be held for at least 30 seconds.  A stretch should never be painful. You should only feel a gentle lengthening or release in the stretched tissue. FLEXION RANGE OF MOTION AND STRETCHING EXERCISES: STRETCH - Flexion, Single Knee to Chest   Lie on a firm bed or floor with both legs extended in front of you.  Keeping one leg in contact with the floor, bring your opposite knee to your chest. Hold your leg in place by either grabbing behind your thigh or at your knee.  Pull until you feel a gentle stretch in your low back. Hold __________ seconds.  Slowly release your grasp and repeat the exercise with the opposite side. Repeat __________ times. Complete this exercise __________ times per day.  STRETCH - Flexion, Double Knee to Chest  Lie on a firm bed or floor with both legs extended in front of you.  Keeping one leg in contact with the floor, bring your opposite knee to your chest.  Tense your stomach muscles to support your back and then lift your other knee to your chest. Hold your legs in place by either grabbing behind your thighs or at your knees.  Pull both knees toward your chest until you feel a gentle stretch in your low  back. Hold __________ seconds.  Tense your stomach muscles and slowly return one leg at a time to the floor. Repeat __________ times. Complete this exercise __________ times per day.  STRETCH - Low Trunk Rotation   Lie on a firm bed or floor. Keeping your legs in front of you, bend your knees so they are both pointed toward the ceiling and your feet are flat on the floor.  Extend your arms out to the side. This will stabilize your upper body by keeping your shoulders in contact with the  floor.  Gently and slowly drop both knees together to one side until you feel a gentle stretch in your low back. Hold for __________ seconds.  Tense your stomach muscles to support your low back as you bring your knees back to the starting position. Repeat the exercise to the other side. Repeat __________ times. Complete this exercise __________ times per day  EXTENSION RANGE OF MOTION AND FLEXIBILITY EXERCISES: STRETCH - Extension, Prone on Elbows  Lie on your stomach on the floor, a bed will be too soft. Place your palms about shoulder width apart and at the height of your head.  Place your elbows under your shoulders. If this is too painful, stack pillows under your chest.  Allow your body to relax so that your hips drop lower and make contact more completely with the floor.  Hold this position for __________ seconds.  Slowly return to lying flat on the floor. Repeat __________ times. Complete this exercise __________ times per day.  RANGE OF MOTION - Extension, Prone Press Ups  Lie on your stomach on the floor, a bed will be too soft. Place your palms about shoulder width apart and at the height of your head.  Keeping your back as relaxed as possible, slowly straighten your elbows while keeping your hips on the floor. You may adjust the placement of your hands to maximize your comfort. As you gain motion, your hands will come more underneath your shoulders.  Hold this position __________ seconds.  Slowly return to lying flat on the floor. Repeat __________ times. Complete this exercise __________ times per day.  STRENGTHENING EXERCISES - Sciatica  These exercises may help you when beginning to rehabilitate your injury. These exercises should be done near your "sweet spot." This is the neutral, low-back arch, somewhere between fully rounded and fully arched, that is your least painful position. When performed in this safe range of motion, these exercises can be used for people who  have either a flexion or extension based injury. These exercises may resolve your symptoms with or without further involvement from your physician, physical therapist or athletic trainer. While completing these exercises, remember:   Muscles can gain both the endurance and the strength needed for everyday activities through controlled exercises.  Complete these exercises as instructed by your physician, physical therapist or athletic trainer. Progress with the resistance and repetition exercises only as your caregiver advises.  You may experience muscle soreness or fatigue, but the pain or discomfort you are trying to eliminate should never worsen during these exercises. If this pain does worsen, stop and make certain you are following the directions exactly. If the pain is still present after adjustments, discontinue the exercise until you can discuss the trouble with your clinician. STRENGTHENING - Deep Abdominals, Pelvic Tilt   Lie on a firm bed or floor. Keeping your legs in front of you, bend your knees so they are both pointed toward the ceiling and your feet  are flat on the floor.  Tense your lower abdominal muscles to press your low back into the floor. This motion will rotate your pelvis so that your tail bone is scooping upwards rather than pointing at your feet or into the floor.  With a gentle tension and even breathing, hold this position for __________ seconds. Repeat __________ times. Complete this exercise __________ times per day.  STRENGTHENING - Abdominals, Crunches   Lie on a firm bed or floor. Keeping your legs in front of you, bend your knees so they are both pointed toward the ceiling and your feet are flat on the floor. Cross your arms over your chest.  Slightly tip your chin down without bending your neck.  Tense your abdominals and slowly lift your trunk high enough to just clear your shoulder blades. Lifting higher can put excessive stress on the low back and does not  further strengthen your abdominal muscles.  Control your return to the starting position. Repeat __________ times. Complete this exercise __________ times per day.  STRENGTHENING - Quadruped, Opposite UE/LE Lift  Assume a hands and knees position on a firm surface. Keep your hands under your shoulders and your knees under your hips. You may place padding under your knees for comfort.  Find your neutral spine and gently tense your abdominal muscles so that you can maintain this position. Your shoulders and hips should form a rectangle that is parallel with the floor and is not twisted.  Keeping your trunk steady, lift your right hand no higher than your shoulder and then your left leg no higher than your hip. Make sure you are not holding your breath. Hold this position __________ seconds.  Continuing to keep your abdominal muscles tense and your back steady, slowly return to your starting position. Repeat with the opposite arm and leg. Repeat __________ times. Complete this exercise __________ times per day.  STRENGTHENING - Abdominals and Quadriceps, Straight Leg Raise   Lie on a firm bed or floor with both legs extended in front of you.  Keeping one leg in contact with the floor, bend the other knee so that your foot can rest flat on the floor.  Find your neutral spine, and tense your abdominal muscles to maintain your spinal position throughout the exercise.  Slowly lift your straight leg off the floor about 6 inches for a count of 15, making sure to not hold your breath.  Still keeping your neutral spine, slowly lower your leg all the way to the floor. Repeat this exercise with each leg __________ times. Complete this exercise __________ times per day. POSTURE AND BODY MECHANICS CONSIDERATIONS - Sciatica Keeping correct posture when sitting, standing or completing your activities will reduce the stress put on different body tissues, allowing injured tissues a chance to heal and  limiting painful experiences. The following are general guidelines for improved posture. Your physician or physical therapist will provide you with any instructions specific to your needs. While reading these guidelines, remember:  The exercises prescribed by your provider will help you have the flexibility and strength to maintain correct postures.  The correct posture provides the optimal environment for your joints to work. All of your joints have less wear and tear when properly supported by a spine with good posture. This means you will experience a healthier, less painful body.  Correct posture must be practiced with all of your activities, especially prolonged sitting and standing. Correct posture is as important when doing repetitive low-stress activities (typing) as  it is when doing a single heavy-load activity (lifting). RESTING POSITIONS Consider which positions are most painful for you when choosing a resting position. If you have pain with flexion-based activities (sitting, bending, stooping, squatting), choose a position that allows you to rest in a less flexed posture. You would want to avoid curling into a fetal position on your side. If your pain worsens with extension-based activities (prolonged standing, working overhead), avoid resting in an extended position such as sleeping on your stomach. Most people will find more comfort when they rest with their spine in a more neutral position, neither too rounded nor too arched. Lying on a non-sagging bed on your side with a pillow between your knees, or on your back with a pillow under your knees will often provide some relief. Keep in mind, being in any one position for a prolonged period of time, no matter how correct your posture, can still lead to stiffness. PROPER SITTING POSTURE In order to minimize stress and discomfort on your spine, you must sit with correct posture Sitting with good posture should be effortless for a healthy body.  Returning to good posture is a gradual process. Many people can work toward this most comfortably by using various supports until they have the flexibility and strength to maintain this posture on their own. When sitting with proper posture, your ears will fall over your shoulders and your shoulders will fall over your hips. You should use the back of the chair to support your upper back. Your low back will be in a neutral position, just slightly arched. You may place a small pillow or folded towel at the base of your low back for support.  When working at a desk, create an environment that supports good, upright posture. Without extra support, muscles fatigue and lead to excessive strain on joints and other tissues. Keep these recommendations in mind: CHAIR:   A chair should be able to slide under your desk when your back makes contact with the back of the chair. This allows you to work closely.  The chair's height should allow your eyes to be level with the upper part of your monitor and your hands to be slightly lower than your elbows. BODY POSITION  Your feet should make contact with the floor. If this is not possible, use a foot rest.  Keep your ears over your shoulders. This will reduce stress on your neck and low back. INCORRECT SITTING POSTURES   If you are feeling tired and unable to assume a healthy sitting posture, do not slouch or slump. This puts excessive strain on your back tissues, causing more damage and pain. Healthier options include:  Using more support, like a lumbar pillow.  Switching tasks to something that requires you to be upright or walking.  Talking a brief walk.  Lying down to rest in a neutral-spine position. PROLONGED STANDING WHILE SLIGHTLY LEANING FORWARD  When completing a task that requires you to lean forward while standing in one place for a long time, place either foot up on a stationary 2-4 inch high object to help maintain the best posture. When both  feet are on the ground, the low back tends to lose its slight inward curve. If this curve flattens (or becomes too large), then the back and your other joints will experience too much stress, fatigue more quickly and can cause pain.  CORRECT STANDING POSTURES Proper standing posture should be assumed with all daily activities, even if they only take a  few moments, like when brushing your teeth. As in sitting, your ears should fall over your shoulders and your shoulders should fall over your hips. You should keep a slight tension in your abdominal muscles to brace your spine. Your tailbone should point down to the ground, not behind your body, resulting in an over-extended swayback posture.  INCORRECT STANDING POSTURES  Common incorrect standing postures include a forward head, locked knees and/or an excessive swayback. WALKING Walk with an upright posture. Your ears, shoulders and hips should all line-up. PROLONGED ACTIVITY IN A FLEXED POSITION When completing a task that requires you to bend forward at your waist or lean over a low surface, try to find a way to stabilize 3 of 4 of your limbs. You can place a hand or elbow on your thigh or rest a knee on the surface you are reaching across. This will provide you more stability so that your muscles do not fatigue as quickly. By keeping your knees relaxed, or slightly bent, you will also reduce stress across your low back. CORRECT LIFTING TECHNIQUES DO :   Assume a wide stance. This will provide you more stability and the opportunity to get as close as possible to the object which you are lifting.  Tense your abdominals to brace your spine; then bend at the knees and hips. Keeping your back locked in a neutral-spine position, lift using your leg muscles. Lift with your legs, keeping your back straight.  Test the weight of unknown objects before attempting to lift them.  Try to keep your elbows locked down at your sides in order get the best strength  from your shoulders when carrying an object.  Always ask for help when lifting heavy or awkward objects. INCORRECT LIFTING TECHNIQUES DO NOT:   Lock your knees when lifting, even if it is a small object.  Bend and twist. Pivot at your feet or move your feet when needing to change directions.  Assume that you cannot safely pick up a paperclip without proper posture.   This information is not intended to replace advice given to you by your health care provider. Make sure you discuss any questions you have with your health care provider.   Document Released: 01/26/2005 Document Revised: 06/12/2014 Document Reviewed: 05/10/2008 Elsevier Interactive Patient Education [employer].

## 2015-09-10 NOTE — Progress Notes (Signed)
Abigail Kelley , [birth year], 35 y.o., female MRN: [id number] Patient Care Team    Relationship Specialty Notifications Start End  Ma Hillock, DO PCP - General Family Medicine  06/19/15   Nunzio Cobbs, MD Consulting Physician Obstetrics and Gynecology  06/19/15     CC: low back pain  Subjective: Pt presents for an acute OV with complaints of low back pain of 4 days duration.  She states she has 29 year old daughter that has been ill and she has been carrying her more often. Associated symptoms include right lumbar, buttock and radiation of pain to knee. Movement makes pain worse, changing positions especially. She denies fever, chills, bladder/bowel dysfunction or muscle weakness. She has taken tylenol and musle relaxer for discomfort. She has a history lumbar back pain, with "disk disease". She brings old xrays from 2013 in [place] with her. No paper copies or report.  Appears to have mild spondylolisthesis .  No Known Allergies Social History  Substance Use Topics  . Smoking status: Never Smoker  . Smokeless tobacco: Never Used  . Alcohol use No   Past Medical History:  Diagnosis Date  . Lumbar herniated disc   . MTHFR mutation ([place])   . PCOS (polycystic ovarian syndrome)   . Thyroid disease    hypothyroid.    Past Surgical History:  Procedure Laterality Date  . CESAREAN SECTION     Family History  Problem Relation Age of Onset  . Hypertension Father   . Alzheimer's disease Paternal Grandmother   . Breast cancer Maternal Aunt   . Breast cancer Paternal Aunt   . Alzheimer's disease Maternal Grandmother   . Lung cancer Maternal Grandfather   . Liver cancer Paternal Grandfather     melenoma metz  . Melanoma Paternal Grandfather      Medication List       Accurate as of 09/10/15 10:51 AM. Always use your most recent med list.          drospirenone-ethinyl estradiol 3-0.03 MG tablet Commonly known as:  YASMIN 28 Take 1 tablet by mouth daily.     levothyroxine 112 MCG tablet Commonly known as:  SYNTHROID Take 1 tablet (112 mcg total) by mouth daily before breakfast.       No results found for this or any previous visit (from the past 24 hour(s)). No results found.   ROS: Negative, with the exception of above mentioned in HPI   Objective:  BP 114/79 (BP Location: Left Arm, Patient Position: Sitting, Cuff Size: Large)   Pulse 74   Temp 98.6 F (37 C) (Oral)   Resp 20   Wt 166 lb 4 oz (75.4 kg)   LMP 08/22/2015   SpO2 99%   BMI 25.65 kg/m  Body mass index is 25.65 kg/m. Gen: Afebrile. No acute distress. Nontoxic in appearance, well developed, well nourished. pleasant female.  HENT: AT. [place].MMM Eyes:Pupils Equal Round Reactive to light, Extraocular movements intact,  Conjunctiva without redness, discharge or icterus. MSK: no erythema, bruising or skin changes. No TTP spine or paraspinal muscle. Mild discomfort right SI and piriformis. Decreased ROM flexion. Pain with left rotation, flexion and mildly with extension. POS FABRE for Right SI pain. NEG SLR. NV intact distally.  Neuro: slow guarded gait and position changes. PERLA. EOMi. Alert. Oriented x3. Muscle strength 5/5 bilateral Lower extremity. DTRs equal bilaterally.  Assessment/Plan: Tieraney Kelley is a 35 y.o. female present for acute OV for  Right-sided low back pain with  right-sided sciatica - rest, no lifting. Stretches in 1 week if able to tolerate - traMADol (ULTRAM) 50 MG tablet; Take 1 tablet (50 mg total) by mouth every 8 (eight) hours as needed.  Dispense: 30 tablet; Refill: 0 - predniSONE (DELTASONE) 20 MG tablet; 60 mg x3d, 40 mg x3d, 20 mg x2d, 10 mg x2d  Dispense: 18 tablet; Refill: 0 - cyclobenzaprine (FLEXERIL) 5 MG tablet; Take 1 tablet (5 mg total) by mouth 3 (three) times daily as needed for muscle spasms.  Dispense: 30 tablet; Refill: 1 - AVS on sciatica - F/U 2 weeks.  electronically signed by:  Howard Pouch, DO  [place]

## 2016-01-16 ENCOUNTER — Telehealth: Payer: Self-pay | Admitting: Obstetrics and Gynecology

## 2016-01-16 NOTE — Telephone Encounter (Signed)
Returned call to patient. Patient states that she is ready to start trying for pregnancy and she would like to come in and consult with Dr. Quincy Simmonds. Patient states she is aware she needs lab work and to discuss her "thryoid issues." Patient requests early appointment on 01/23/16. Appointment scheduled for 01/23/16 at 0800. Patient agreeable to date and time of appointment.   Routing to provider for review.

## 2016-01-16 NOTE — Telephone Encounter (Signed)
Patient wants to come in to see Dr. Quincy Simmonds. Not sure what type of appt she may need.

## 2016-01-16 NOTE — Telephone Encounter (Signed)
Thank you for scheduling appointment.  Encounter closed.

## 2016-01-23 ENCOUNTER — Ambulatory Visit (INDEPENDENT_AMBULATORY_CARE_PROVIDER_SITE_OTHER): Payer: 59 | Admitting: Obstetrics and Gynecology

## 2016-01-23 ENCOUNTER — Encounter: Payer: Self-pay | Admitting: Obstetrics and Gynecology

## 2016-01-23 VITALS — BP 120/80 | HR 70 | Resp 16 | Ht 67.5 in | Wt 173.0 lb

## 2016-01-23 DIAGNOSIS — Z1589 Genetic susceptibility to other disease: Secondary | ICD-10-CM

## 2016-01-23 DIAGNOSIS — E039 Hypothyroidism, unspecified: Secondary | ICD-10-CM | POA: Diagnosis not present

## 2016-01-23 DIAGNOSIS — Z8742 Personal history of other diseases of the female genital tract: Secondary | ICD-10-CM

## 2016-01-23 DIAGNOSIS — R7989 Other specified abnormal findings of blood chemistry: Secondary | ICD-10-CM

## 2016-01-23 DIAGNOSIS — E559 Vitamin D deficiency, unspecified: Secondary | ICD-10-CM | POA: Diagnosis not present

## 2016-01-23 DIAGNOSIS — E7212 Methylenetetrahydrofolate reductase deficiency: Secondary | ICD-10-CM

## 2016-01-23 DIAGNOSIS — Z3169 Encounter for other general counseling and advice on procreation: Secondary | ICD-10-CM | POA: Diagnosis not present

## 2016-01-23 LAB — HEMOGLOBIN A1C
HEMOGLOBIN A1C: 4.9 % (ref <5.7)
Mean Plasma Glucose: 94 mg/dL

## 2016-01-23 LAB — CBC
HCT: 39.5 % (ref 35.0–45.0)
Hemoglobin: 13.2 g/dL (ref 11.7–15.5)
MCH: 28.9 pg (ref 27.0–33.0)
MCHC: 33.4 g/dL (ref 32.0–36.0)
MCV: 86.6 fL (ref 80.0–100.0)
MPV: 10.4 fL (ref 7.5–12.5)
PLATELETS: 228 10*3/uL (ref 140–400)
RBC: 4.56 MIL/uL (ref 3.80–5.10)
RDW: 12.7 % (ref 11.0–15.0)
WBC: 6.7 10*3/uL (ref 3.8–10.8)

## 2016-01-23 LAB — COMPREHENSIVE METABOLIC PANEL
ALT: 15 U/L (ref 6–29)
AST: 18 U/L (ref 10–30)
Albumin: 4 g/dL (ref 3.6–5.1)
Alkaline Phosphatase: 37 U/L (ref 33–115)
BUN: 13 mg/dL (ref 7–25)
CHLORIDE: 104 mmol/L (ref 98–110)
CO2: 27 mmol/L (ref 20–31)
Calcium: 9.2 mg/dL (ref 8.6–10.2)
Creat: 0.87 mg/dL (ref 0.50–1.10)
GLUCOSE: 92 mg/dL (ref 65–99)
POTASSIUM: 5 mmol/L (ref 3.5–5.3)
Sodium: 138 mmol/L (ref 135–146)
Total Bilirubin: 0.4 mg/dL (ref 0.2–1.2)
Total Protein: 7 g/dL (ref 6.1–8.1)

## 2016-01-23 LAB — THYROID PANEL WITH TSH
FREE THYROXINE INDEX: 3 (ref 1.4–3.8)
T3 Uptake: 22 % (ref 22–35)
T4 TOTAL: 13.5 ug/dL — AB (ref 4.5–12.0)
TSH: 7.83 m[IU]/L — AB

## 2016-01-23 LAB — VITAMIN D 25 HYDROXY (VIT D DEFICIENCY, FRACTURES): Vit D, 25-Hydroxy: 33 ng/mL (ref 30–100)

## 2016-01-23 NOTE — Progress Notes (Signed)
GYNECOLOGY  VISIT   HPI: 35 y.o.   Married  Caucasian  female   [phone] with Patient's last menstrual period was 01/01/2016 (exact date).   here for  Preconceptual consult.  Planning on IVF in February.  Has frozen embryos.  Saw Dr. Kerin Perna to coordinate IVF care.  Patient already did preIVF exams.  Did exam for Zika virus also but needs to repeat in January 2018.  Wants to repeat general exams and focus on thyroid function.  Asking about pelvic ultrasound in preparation.  Last pelvic exam was May 2016 and was normal.   On OCPs and has regular menses with no intermenstrual bleeding.  No significant cramping.  Hx PCOS per patient.   On PNV.  Not drinking ETOH, smoking or taking herbal remedies.   No current exercise.  Stopped the last 2 months.   Diet lacking in milk.  States she took heparin in the last pregnancy due to MTHFR mutation.   GYNECOLOGIC HISTORY: Patient's last menstrual period was 01/01/2016 (exact date). Contraception:  ocp Menopausal hormone therapy:  none Last mammogram:  7/16 normal Last pap smear:   04/11/15 neg HPV HR neg        OB History    Gravida Para Term Preterm AB Living   3 1 1   2 1    SAB TAB Ectopic Multiple Live Births   2                 Patient Active Problem List   Diagnosis Date Noted  . Hypothyroidism 06/19/2015  . 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 History of MTHFR mutation 04/13/2015    Past Medical History:  Diagnosis Date  . Lumbar herniated disc   . MTHFR mutation ([place])   . PCOS (polycystic ovarian syndrome)   . Thyroid disease    hypothyroid.     Past Surgical History:  Procedure Laterality Date  . CESAREAN SECTION      Current Outpatient Prescriptions  Medication Sig Dispense Refill  . drospirenone-ethinyl estradiol (YASMIN 28) 3-0.03 MG tablet Take 1 tablet by mouth daily. 3 Package 3  . levothyroxine (SYNTHROID) 112 MCG tablet Take 1 tablet (112 mcg total) by mouth daily before breakfast. 30 tablet 10    No current facility-administered medications for this visit.      ALLERGIES: Patient has no known allergies.  Family History  Problem Relation Age of Onset  . Hypertension Father   . Alzheimer's disease Paternal Grandmother   . Breast cancer Maternal Aunt   . Breast cancer Paternal Aunt   . Alzheimer's disease Maternal Grandmother   . Lung cancer Maternal Grandfather   . Liver cancer Paternal Grandfather     melenoma metz  . Melanoma Paternal Grandfather     Social History   Social History  . Marital status: Married    Spouse name: N/A  . Number of children: N/A  . Years of education: N/A   Occupational History  . Not on file.   Social History Main Topics  . Smoking status: Never Smoker  . Smokeless tobacco: Never Used  . Alcohol use No  . Drug use: No  . Sexual activity: Yes    Birth control/ protection: Pill   Other Topics Concern  . Not on file   Social History Narrative   Married. [name] is her husband. 1 Child Verdis Frederickson).   Moved from [place] (11/2014)   MBA, Human Resources.    Drinks caffeine   Wears her seatbelt, wears her bike helmet  Smoke detector in the home.    Feels safe in her relationships.        ROS:  Pertinent items are noted in HPI.  PHYSICAL EXAMINATION:    BP 120/80   Pulse 70   Resp 16   Ht 5' 7.5" (1.715 m)   Wt 173 lb (78.5 kg)   LMP 01/01/2016 (Exact Date)   BMI 26.70 kg/m     General appearance: alert, cooperative and appears stated age   ASSESSMENT  Plan for future IVF.  Hypothyroidism. MTHFR mutation. Hx polycystic ovarian disease . Hx vit D deficiency.   PLAN  Will do general blood work today. Will set up referral to Maternal Fetal Medicine.  General prenatal counseling provided.  List of OB providers to patient.  She will check on Zika testing as she and her husband need this.    An After Visit Summary was printed and given to the patient.  ___25___ minutes face to face time of which over 50% was spent  in counseling.

## 2016-01-23 NOTE — Progress Notes (Signed)
Scheduled patient to see Maternal Fetal Medicine at [facility] on December 29th at 9 am. Patient is agreeable to date and time.

## 2016-01-30 ENCOUNTER — Telehealth: Payer: Self-pay

## 2016-01-30 NOTE — Telephone Encounter (Signed)
Attempted to reach patient at number provided [phone]. There was no answer and recording states that the voicemail box is not set up yet.

## 2016-01-30 NOTE — Telephone Encounter (Addendum)
-----   Message from Nunzio Cobbs, MD sent at 01/29/2016  7:18 PM EST ----- Please contact patient in follow up to my message.  I want to make sure she has received my message.  She may need a referral for her abnormal thyroid function.   [place]  Notes Recorded by Nunzio Cobbs, MD on 01/26/2016 at 8:16 PM EST Results to patient through My Chart.  Hi Nakira,   I am sharing your blood work with you.  Your thyroid testing is showing some conflicting test results indicating both underactive and overactive thyroid at the same time.  This is going to need to be repeated in about two weeks, and I would recommend seeing a primary care provider or endocrinologist for this.  Please let me know if you need a referral.  Your hemoglobin A1C indicates good blood sugar control. Your blood chemistries, blood counts, and vitamin D are all normal.   Please call the office for any questions.   Josefa Half, MD

## 2016-02-05 ENCOUNTER — Ambulatory Visit: Payer: Self-pay | Admitting: Family Medicine

## 2016-02-07 ENCOUNTER — Ambulatory Visit (HOSPITAL_BASED_OUTPATIENT_CLINIC_OR_DEPARTMENT_OTHER)
Admission: RE | Admit: 2016-02-07 | Discharge: 2016-02-07 | Disposition: A | Discharge status: Home / Self Care | Payer: 59 | Source: Ambulatory Visit | Attending: Family Medicine | Admitting: Family Medicine

## 2016-02-07 ENCOUNTER — Encounter: Payer: Self-pay | Admitting: Family Medicine

## 2016-02-07 ENCOUNTER — Ambulatory Visit (HOSPITAL_COMMUNITY)
Admission: RE | Admit: 2016-02-07 | Discharge: 2016-02-07 | Disposition: A | Discharge status: Home / Self Care | Payer: 59 | Source: Ambulatory Visit | Attending: Family Medicine | Admitting: Family Medicine

## 2016-02-07 ENCOUNTER — Ambulatory Visit (INDEPENDENT_AMBULATORY_CARE_PROVIDER_SITE_OTHER): Payer: 59 | Admitting: Family Medicine

## 2016-02-07 VITALS — BP 117/83 | HR 100 | Temp 98.4°F | Resp 18 | Ht 68.0 in | Wt 180.5 lb

## 2016-02-07 DIAGNOSIS — R946 Abnormal results of thyroid function studies: Secondary | ICD-10-CM | POA: Diagnosis present

## 2016-02-07 DIAGNOSIS — E039 Hypothyroidism, unspecified: Secondary | ICD-10-CM

## 2016-02-07 DIAGNOSIS — R7989 Other specified abnormal findings of blood chemistry: Secondary | ICD-10-CM

## 2016-02-07 NOTE — Progress Notes (Signed)
Abigail Kelley , 04-Nov-1980, 35 y.o., female MRN: [id number] Patient Care Team    Relationship Specialty Notifications Start End  Ma Hillock, DO PCP - General Family Medicine  06/19/15   Nunzio Cobbs, MD Consulting Physician Obstetrics and Gynecology  06/19/15     CC: Hypothyroidism Subjective: Pt presents for an OV with complaints of abnormal labs at her gynecological office surrounding her thyroid function. Patient has had a long-standing history of hypothyroidism first diagnosed in [place]. She reports fluctuating doses usually between 112 g and 125 g. Upon establishment to her gynecologist in March she was found to have over replaced thyroid with a TSH of 0.13, and a T4 of 15.6. Her levothyroxine was then decreased from 125 g to 112 g. With over her thyroid function study repeat in March and July were normal, patient felt that she was having symptoms of when she had a low thyroid. March labs included a TSH of 4.09 and a T4 of 10.9, TSH and T4 in July were 2.93 and 1.2 respectively. 2 weeks ago on 01/23/2016 her thyroid was checked again and her TSH was 7.83 and a T4 of 13.5. Patient and her husband are attempting to have another, and extending infertility specialist. She is preparing to travel back to [place], where she has frozen embryos to have in vitro in February. She reports having a thyroid ultrasound in [place] and is able to bring this up on the computer for me to Today, it is in [place], but appears to state that it was heterogeneous without nodules and smaller in size.   Recent Results (from the past 2160 hour(s))  Thyroid Panel With TSH     Status: Abnormal   Collection Time: 01/23/16  9:08 AM  Result Value Ref Range   T4, Total 13.5 (H) 4.5 - 12.0 ug/dL   T3 Uptake 22 22 - 35 %   Free Thyroxine Index 3.0 1.4 - 3.8   TSH 7.83 (H) mIU/L    Comment:   Reference Range   > or = 20 Years  0.40-4.50   Pregnancy Range First trimester  0.26-2.66 Second  trimester 0.55-2.73 Third trimester  0.43-2.91     VITAMIN D 25 Hydroxy (Vit-D Deficiency, Fractures)     Status: None   Collection Time: 01/23/16  9:08 AM  Result Value Ref Range   Vit D, 25-Hydroxy 33 30 - 100 ng/mL    Comment: Vitamin D Status           25-OH Vitamin D        Deficiency                <20 ng/mL        Insufficiency         20 - 29 ng/mL        Optimal             > or = 30 ng/mL   For 25-OH Vitamin D testing on patients on D2-supplementation and patients for whom quantitation of D2 and D3 fractions is required, the QuestAssureD 25-OH VIT D, (D2,D3), LC/MS/MS is recommended: order code [id number] (patients > 2 yrs).   CBC     Status: None   Collection Time: 01/23/16  9:08 AM  Result Value Ref Range   WBC 6.7 3.8 - 10.8 K/uL   RBC 4.56 3.80 - 5.10 MIL/uL   Hemoglobin 13.2 11.7 - 15.5 g/dL   HCT 39.5 35.0 - 45.0 %   MCV  86.6 80.0 - 100.0 fL   MCH 28.9 27.0 - 33.0 pg   MCHC 33.4 32.0 - 36.0 g/dL   RDW 12.7 11.0 - 15.0 %   Platelets 228 140 - 400 K/uL   MPV 10.4 7.5 - 12.5 fL  Comprehensive metabolic panel     Status: None   Collection Time: 01/23/16  9:08 AM  Result Value Ref Range   Sodium 138 135 - 146 mmol/L   Potassium 5.0 3.5 - 5.3 mmol/L   Chloride 104 98 - 110 mmol/L   CO2 27 20 - 31 mmol/L   Glucose, Bld 92 65 - 99 mg/dL   BUN 13 7 - 25 mg/dL   Creat 0.87 0.50 - 1.10 mg/dL   Total Bilirubin 0.4 0.2 - 1.2 mg/dL   Alkaline Phosphatase 37 33 - 115 U/L   AST 18 10 - 30 U/L   ALT 15 6 - 29 U/L   Total Protein 7.0 6.1 - 8.1 g/dL   Albumin 4.0 3.6 - 5.1 g/dL   Calcium 9.2 8.6 - 10.2 mg/dL  Hemoglobin A1c     Status: None   Collection Time: 01/23/16  9:08 AM  Result Value Ref Range   Hgb A1c MFr Bld 4.9 <5.7 %    Comment:   For the purpose of screening for the presence of diabetes:   <5.7%       Consistent with the absence of diabetes 5.7-6.4 %   Consistent with increased risk for diabetes (prediabetes) >=6.5 %     Consistent with diabetes   This  assay result is consistent with a decreased risk of diabetes.   Currently, no consensus exists regarding use of hemoglobin A1c for diagnosis of diabetes in children.   According to American Diabetes Association (ADA) guidelines, hemoglobin A1c <7.0% represents optimal control in non-pregnant diabetic patients. Different metrics may apply to specific patient populations. Standards of Medical Care in Diabetes (ADA).      Mean Plasma Glucose 94 mg/dL    No Known Allergies Social History  Substance Use Topics  . Smoking status: Never Smoker  . Smokeless tobacco: Never Used  . Alcohol use No   Past Medical History:  Diagnosis Date  . Lumbar herniated disc   . MTHFR mutation ([place])   . PCOS (polycystic ovarian syndrome)   . Thyroid disease    hypothyroid.    Past Surgical History:  Procedure Laterality Date  . CESAREAN SECTION     Family History  Problem Relation Age of Onset  . Hypertension Father   . Alzheimer's disease Paternal Grandmother   . Breast cancer Maternal Aunt   . Breast cancer Paternal Aunt   . Alzheimer's disease Maternal Grandmother   . Lung cancer Maternal Grandfather   . Liver cancer Paternal Grandfather     melenoma metz  . Melanoma Paternal Grandfather    Allergies as of 02/07/2016   No Known Allergies     Medication List       Accurate as of 02/07/16  2:58 PM. Always use your most recent med list.          drospirenone-ethinyl estradiol 3-0.03 MG tablet Commonly known as:  YASMIN 28 Take 1 tablet by mouth daily.   levothyroxine 112 MCG tablet Commonly known as:  SYNTHROID Take 1 tablet (112 mcg total) by mouth daily before breakfast.       No results found for this or any previous visit (from the past 24 hour(s)). No results found.   ROS:  Negative, with the exception of above mentioned in HPI   Objective:  BP 117/83 (BP Location: Left Arm, Patient Position: Sitting, Cuff Size: Large)   Pulse 100   Temp 98.4 F (36.9 C)    Resp 18   Ht 5\' 8"  (1.727 m)   Wt 180 lb 8 oz (81.9 kg)   LMP 02/05/2016   SpO2 99%   BMI 27.44 kg/m  Body mass index is 27.44 kg/m. Gen: Afebrile. No acute distress. Nontoxic in appearance, well developed, well nourished.  HENT: AT. [place].  MMM, no oral lesions.  Eyes: No exophthalmos. Pupils Equal Round Reactive to light, Extraocular movements intact,  Conjunctiva without redness, discharge or icterus. Neck/lymp/endocrine: Supple, no lymphadenopathy, no thyromegaly, no tenderness to palpation of thyroid. CV: RRR. No edema   Assessment/Plan: Luvia Granneman is a 35 y.o. female present for acute OV for  Hypothyroidism, unspecified type Elevated serum free T4 level - Discussed with patient and her husband today her elevated TSH, with an elevated T4 is an uncommon finding and can be caused by a few different findings. Will first repeat thyroid panel were accuracy including immunoglobulins and antibodies.  - -US THYROID; Future - Thyrotropin receptor autoabs - T4 - Thyroid Stimulating Immunoglobulin - Prolactin - T4, free - T3, free - TSH - By labs patient potentially could have secondary hyperthyroidism vs lab error vs lymphocytic thyroiditis. - Discussed feedback loop concerning pituitary gland and thyroid gland briefly today. - Patient is attempting to become pregnant within the next few months, would certainly want her thyroid function to be normalized by then. If rpt labs prove evidence of secondary hyperthyroidism, with normal thyroid gland by ultrasound would favor ordering MRI of pituitary gland and refer to endocrine.  > 25 minutes spent with patient, >50% of time spent face to face counseling and/or correlating care  electronically signed by:  Howard Pouch, DO  [place]

## 2016-02-07 NOTE — Consult Note (Signed)
Maternal Fetal Medicine Consultation  Requesting Provider(s): Gala Romney, MD  Reason for consultation: Preconception counseling  HPI: Abigail Kelley is a 35 yo [id number] Kelley for preconception counseling.  Abigail Kelley recently moved to the area from [place].  She has a history of two previous early SABs. Abigail Kelley has a history of PCOS and her husband was diagnosed with oligospermia - underwent IVF in [place] with a successful delivery at 58 weeks.  During her evaluation with that pregnancy, she was diagnosed with MTFHR heterozygous state and was treated with Lovenox throughout the course of her pregnancy.  She herself denies any history of venous thromboembolism and denies any family history of VTE.  She reports that she underwent a primary, scheduled C-section for timing from her Lovenox use.  Abigail Kelley plans to return to [place] for IVF with frozen embryos from her previous round of IVF.  She will return to [facility] thereafter and plans to be followed by [facility] until a viable pregnancy has been diagnosed and will arrange prenatal care thereafter.  OB History: OB History    Gravida Para Term Preterm AB Living   3 1 1   2 1    SAB TAB Ectopic Multiple Live Births   2              PMH:  Past Medical History:  Diagnosis Date  . Lumbar herniated disc   . MTHFR mutation ([place])   . PCOS (polycystic ovarian syndrome)   . Thyroid disease    hypothyroid.     PSH:  Past Surgical History:  Procedure Laterality Date  . CESAREAN SECTION     Meds:  Current Outpatient Prescriptions on File Prior to Encounter  Medication Sig Dispense Refill  . drospirenone-ethinyl estradiol (YASMIN 28) 3-0.03 MG tablet Take 1 tablet by mouth daily. 3 Package 3  . levothyroxine (SYNTHROID) 112 MCG tablet Take 1 tablet (112 mcg total) by mouth daily before breakfast. 30 tablet 10   No current facility-administered medications on file prior to encounter.    Allergies: No Known  Allergies   FH:  Family History  Problem Relation Age of Onset  . Hypertension Father   . Alzheimer's disease Paternal Grandmother   . Breast cancer Maternal Aunt   . Breast cancer Paternal Aunt   . Alzheimer's disease Maternal Grandmother   . Lung cancer Maternal Grandfather   . Liver cancer Paternal Grandfather     melenoma metz  . Melanoma Paternal Grandfather    Soc:  Social History   Social History  . Marital status: Married    Spouse name: N/A  . Number of children: N/A  . Years of education: N/A   Occupational History  . Not on file.   Social History Main Topics  . Smoking status: Never Smoker  . Smokeless tobacco: Never Used  . Alcohol use No  . Drug use: No  . Sexual activity: Yes    Birth control/ protection: Pill   Other Topics Concern  . Not on file   Social History Narrative   Married. Abigail Kelley is her husband. 1 Child Abigail Kelley).   Moved from [place] (11/2014)   MBA, Human Resources.    Drinks caffeine   Wears her seatbelt, wears her bike helmet   Smoke detector in the home.    Feels safe in her relationships.        A/P: 1) History of PCOS, infertility - patient planning on having IVF implantation with frozen embryos in [place] early 2018.  Will follow up with [facility] initially and will establish OB care once a viable pregnancy is confirmed.  Egg retrieval occurred at age 34 so would not anticipate any significant risk of aneuploidy.  2) MTFHR heterozygote - in her previous pregnancy in [place], the patient was maintained on a prophylactic dose of Lovenox throughout the course of her pregnancy.  The patient denies any history of venous thromboembolism or family history of VTE.  MTFHR heterozygous state is not associated with a higher risk of VTE and does not appear to be associated with untoward pregnancy outcomes.  While I would not ordinarily recommend Lovenox prophylaxis in this situation, the patient and her husband are convinced that this  medication helped maintain her previous pregnancy without complications.  I would therefore continue Lovenox at a prophylactic dose (40 mg daily) as long as the patient and her husband understand that there my be no benefit from its use.  I would also recommend supplemental folic acid (1 mg daily) in addition to prenatal vitamins.  3) Hypothyroidism - the patient is currently on Synthroid 112 mcg daily.  Her most recent TSH was elevated (7.83 ug/dl) with a mildly elevated.  She is scheduled to follow up with her primary care physician later today for a repeat test and will have her medications adjusted.  In general, I would recommend that the patient be scheduled for a detailed anatomy ultrasound at approximately 18 weeks and would follow with serial ultrasounds every 4-6 weeks throughout the course of the pregnancy.  I would recommend antenatal testing if there is any evidence of fetal growth restriction or if the patient's thyroid levels are poorly controlled.  4) Zika concerns - the patient reports that she will require Zika testing from both herself and her husband within 30 days before IVF in [place] (local clinic requirement).  The CDC currently does not recommend Zika IgM testing in asymptomatic patients from a [place] endemic area who are contemplating pregnancy.  In general, testing is performed through the [facility] Department of Health and the CDC and may be difficult to perform in this scenario.  We would need to explore whether or not this testing is available for a commercial laboratory (? [facility]).  5) Route of delivery - the patient is undecided about route of delivery.  She should be a candidate for a trial of labor and would quote at least a 60% success rate given the previous indications for her C-section.   Thank you for the opportunity to be a part of the care of Abigail Kelley. Please contact our office if we can be of further assistance.   I spent approximately 30 minutes with this patient  with over 50% of time spent in face-to-face counseling.  Benjaman Lobe, MD Maternal Fetal Medicine

## 2016-02-07 NOTE — Patient Instructions (Signed)
I will call with results of lab once available.  They will call you with a schedule for your thyroid US.

## 2016-02-11 ENCOUNTER — Telehealth: Payer: Self-pay | Admitting: Family Medicine

## 2016-02-11 DIAGNOSIS — N979 Female infertility, unspecified: Secondary | ICD-10-CM

## 2016-02-11 DIAGNOSIS — E039 Hypothyroidism, unspecified: Secondary | ICD-10-CM

## 2016-02-11 DIAGNOSIS — R7989 Other specified abnormal findings of blood chemistry: Secondary | ICD-10-CM

## 2016-02-11 LAB — TSH: TSH: 3.1 m[IU]/L

## 2016-02-11 LAB — T3, FREE: T3, Free: 2.7 pg/mL (ref 2.3–4.2)

## 2016-02-11 LAB — PROLACTIN: Prolactin: 10.6 ng/mL

## 2016-02-11 LAB — T4, FREE: Free T4: 1.3 ng/dL (ref 0.8–1.8)

## 2016-02-11 LAB — T4: T4, Total: 11.9 ug/dL (ref 4.5–12.0)

## 2016-02-11 NOTE — Telephone Encounter (Signed)
Patient notified and verbalized understanding. Patient wishes to go ahead with referral to endocrinology.

## 2016-02-11 NOTE — Telephone Encounter (Signed)
Please call pt: - her repeat labs are normal. Still awaiting 2 of the more specialized test to return.  - her [place] was consistent with the one she had completed in [place], which was a smaller appearing thyroid, otherwise normal.  - considering her fluctuations, and her attempt at pregnancy via invitro for infetility, I favor sending her to endocrine just to ensure accuracy and receive their opinions.

## 2016-02-11 NOTE — Telephone Encounter (Signed)
Notes Recorded by Lowella Fairy, CMA on 02/05/2016 at 10:52 AM EST Patient has reviewed her MyChart message.

## 2016-02-13 ENCOUNTER — Encounter: Payer: Self-pay | Admitting: Family Medicine

## 2016-02-14 ENCOUNTER — Ambulatory Visit (INDEPENDENT_AMBULATORY_CARE_PROVIDER_SITE_OTHER): Payer: 59 | Admitting: Internal Medicine

## 2016-02-14 ENCOUNTER — Encounter: Payer: Self-pay | Admitting: Internal Medicine

## 2016-02-14 VITALS — BP 124/84 | HR 100 | Ht 68.5 in | Wt 175.0 lb

## 2016-02-14 DIAGNOSIS — E063 Autoimmune thyroiditis: Secondary | ICD-10-CM

## 2016-02-14 DIAGNOSIS — E038 Other specified hypothyroidism: Secondary | ICD-10-CM

## 2016-02-14 LAB — THYROID STIMULATING IMMUNOGLOBULIN

## 2016-02-14 MED ORDER — SYNTHROID 125 MCG PO TABS: 125.0000 ug | ORAL_TABLET | ORAL | 1 refills | Status: DC

## 2016-02-14 MED ORDER — SYNTHROID 112 MCG PO TABS: 112.0000 ug | ORAL_TABLET | ORAL | 1 refills | Status: DC

## 2016-02-14 NOTE — Patient Instructions (Addendum)
Please continue stop levothyroxine and start Synthroid 112 alternating with 125 mcg every other day.  Take the thyroid hormone every day, with water, at least 30 minutes before breakfast, separated by at least 4 hours from: - acid reflux medications - calcium - iron - multivitamins  Please come back for labs on 03/12/2016.  Please come back for another visit in 6 months. Please let me know if we need to move the appt to 3 months.

## 2016-02-14 NOTE — Progress Notes (Addendum)
Patient ID: Abigail Kelley, female   DOB: 21-Dec-1980, 36 y.o.   MRN: [id number]    HPI  Abigail Kelley is a 36 y.o.-year-old female, referred by her PCP, Dr. Raoul Pitch, for management of Hashimoto's hypothyroidism. She moved here from [place] in 09/2014. ObGyn: Dr. Quincy Kelley  Pt. has been dx with hypothyroidism in 2010 in [place] >> on Levothyroxine 112 mcg (decreased from 125 mcg in 04/2015).  She takes the thyroid hormone: - fasting - with water - separated by 20-30 min from b'fast (she believes that this is the cause for her higher TSH level last month)>> changed in last month to >30 min after LT4 - no calcium, iron, PPIs - + multivitamins at night  I reviewed pt's thyroid tests: Lab Results  Component Value Date   TSH 3.10 02/07/2016   TSH 7.83 (H) 01/23/2016   TSH 2.93 08/29/2015   TSH 4.09 06/07/2015   TSH 0.13 (L) 04/11/2015   FREET4 1.3 02/07/2016   FREET4 1.2 08/29/2015    She also had a thyroid ultrasound in 02/07/2016: Slightly diminutive and markedly heterogeneous appearing thyroid gland without discrete nodule or mass.  She is undergoing fertility treatment >> will have an IVF in 03/2016 (leaves for [place].  She had 2 miscarriages in 2012. In 11/2012, her IVF was successful >> healthy girl. She has a h/o PCOS. Her husband's sperm count is low.   Pt Denies: - weight gain - fatigue - cold intolerance - depression - constipation - dry skin - hair loss  Pt denies feeling nodules in neck, hoarseness, dysphagia/odynophagia, SOB with lying down.  She has no FH of thyroid disorders. No FH of thyroid cancer.  No h/o radiation tx to head or neck. No recent use of iodine supplements.  Pt. also has a history of PCOS, Lumbar herniated disk. C section 2014.  ROS: Constitutional: no weight gain/loss, no fatigue, no subjective hyperthermia/hypothermia Eyes: no blurry vision, no xerophthalmia ENT: no sore throat, no nodules palpated in throat, no dysphagia/odynophagia, no  hoarseness Cardiovascular: no CP/SOB/palpitations/leg swelling Respiratory: no cough/SOB Gastrointestinal: no N/V/D/C Musculoskeletal: no muscle/joint aches Skin: no rashes Neurological: no tremors/numbness/tingling/dizziness Psychiatric: no depression/anxiety  Past Medical History:  Diagnosis Date  . Lumbar herniated disc   . MTHFR mutation ([place])   . PCOS (polycystic ovarian syndrome)   . Thyroid disease    hypothyroid.    Past Surgical History:  Procedure Laterality Date  . CESAREAN SECTION     Social History   Social History  . Marital status: Married    Spouse name: N/A  . Number of children: 1   Occupational History  . Human resources   Social History Main Topics  . Smoking status: Never Smoker  . Smokeless tobacco: Never Used  . Alcohol use No  . Drug use: No  . Sexual activity: Yes    Birth control/ protection: Pill   Social History Narrative   Married. [name] - husband. [place].   Moved from [place] (11/2014)   MBA, Human Resources.    Current Outpatient Prescriptions on File Prior to Visit  Medication Sig Dispense Refill  . drospirenone-ethinyl estradiol (YASMIN 28) 3-0.03 MG tablet Take 1 tablet by mouth daily. 3 Package 3  . levothyroxine (SYNTHROID) 112 MCG tablet Take 1 tablet (112 mcg total) by mouth daily before breakfast. 30 tablet 10   No current facility-administered medications on file prior to visit.    No Known Allergies Family History  Problem Relation Age of Onset  . Hypertension  Father   . Alzheimer's disease Paternal Grandmother   . Breast cancer Maternal Aunt   . Breast cancer Paternal Aunt   . Alzheimer's disease Maternal Grandmother   . Lung cancer Maternal Grandfather   . Liver cancer Paternal Grandfather     melenoma metz  . Melanoma Paternal Grandfather    PE: Ht 5' 8.5" (1.74 m)   Wt 175 lb (79.4 kg)   LMP 02/05/2016   BMI 26.22 kg/m  Wt Readings from Last 3 Encounters:  02/14/16 175 lb (79.4 kg)  02/07/16  180 lb 8 oz (81.9 kg)  01/23/16 173 lb (78.5 kg)   Constitutional: Normal weight, in NAD Eyes: PERRLA, EOMI, no exophthalmos ENT: moist mucous membranes, no thyromegaly, no cervical lymphadenopathy Cardiovascular: RRR, No MRG Respiratory: CTA B Gastrointestinal: abdomen soft, NT, ND, BS+ Musculoskeletal: no deformities, strength intact in all 4 Skin: moist, warm, no rashes Neurological: no tremor with outstretched hands, DTR normal in all 4  ASSESSMENT: 1. Hashimoto's Hypothyroidism  PLAN:  1. Patient with long-standing hypothyroidism, on levothyroxine therapy, with fluctuating TFTs. She is worried about the fluctuation in her TFTs, especially since she is flying to [place] in a month for another round of IVF. - she appears euthyroid, and does not appear to have a goiter, thyroid nodules, or neck compression symptoms - We discussed about correct intake of levothyroxine, fasting, with water, separated by at least 30 minutes from breakfast, and separated by more than 4 hours from calcium, iron, multivitamins, acid reflux medications (PPIs). She is now taking it correctly. - We discussed about TSH start getting pregnancy: <2.5 in first trimester <3 in second trimester <3.5 in third trimester - Since her previous dose of levothyroxine of 125 g daily was too high (she had a suppressed TSH of 0.13 while on this dose) and the current dose of 112 g gave her TSH levels of >2.5 even when she was taking it correctly, I suggested to start alternating 112 with 125 g every other day. I also suggested to switch to Synthroid d.a.w. to avoid fluctuations in her levels, especially during the upcoming pregnancy. She agrees with both changes. - will check thyroid tests in 4 weeks, right before her IVF: TSH, free T4; will add thyroid antibodies . We discussed that we will need another set of antibodies at the end of her second trimester, if they are elevated at next lab draw - Otherwise, I will see her back  in 6 months, however, she will let me know if she becomes pregnant, and in that case, we may need an earlier appointment - I advised her that no further thyroid ultrasound are needed in the future (she gets a yearly ultrasound in [place])  Orders Placed This Encounter  Procedures  . T4, free  . TSH  . Thyroglobulin antibody  . Thyroid peroxidase antibody   Philemon Kingdom, MD PhD [facility]

## 2016-02-16 LAB — THYROTROPIN RECEPTOR AUTOABS: THYROTROPIN RECEPTOR AB: 9.4 % (ref <=16.0)

## 2016-02-17 ENCOUNTER — Telehealth: Payer: Self-pay | Admitting: Family Medicine

## 2016-02-17 ENCOUNTER — Encounter: Payer: Self-pay | Admitting: Family Medicine

## 2016-02-17 ENCOUNTER — Encounter: Payer: Self-pay | Admitting: *Deleted

## 2016-02-17 NOTE — Telephone Encounter (Signed)
Please call pt: - her last two specialized test for her thyroid resulted and were normal. I am glad to see she was able to to establish with Dr. Renne Crigler so quickly to address her thyroid. Please wish her good luck in the upcoming invitro procedure.

## 2016-02-17 NOTE — Telephone Encounter (Signed)
Voice mail not set up unable to leave a message. Sent message in my chart.

## 2016-02-18 DIAGNOSIS — D229 Melanocytic nevi, unspecified: Secondary | ICD-10-CM

## 2016-02-18 DIAGNOSIS — Z1589 Genetic susceptibility to other disease: Secondary | ICD-10-CM

## 2016-02-18 HISTORY — DX: Genetic susceptibility to other disease: Z15.89

## 2016-02-18 HISTORY — DX: Melanocytic nevi, unspecified: D22.9

## 2016-02-19 ENCOUNTER — Encounter: Payer: Self-pay | Admitting: Family Medicine

## 2016-02-20 ENCOUNTER — Telehealth: Payer: Self-pay

## 2016-02-20 ENCOUNTER — Encounter: Payer: Self-pay | Admitting: Obstetrics and Gynecology

## 2016-02-20 NOTE — Telephone Encounter (Signed)
Visit Follow-Up Question  Message [id number]  From Abigail Kelley To Abigail Cobbs, MD Sent 02/20/2016 8:50 AM  hi Dr Quincy Simmonds,  in order to to the IVF in [place], the clinic there is asking that we bring the following lab results. Is it possible that you order these for me and my husband?... the Clinic here in [place] said they can do the [place]...  Abigail Kelley (myself)   HTLV I and II  Mycoplasma and Ureaplasma  Rubeola IgM  toxoplasmosis IgG and IgM  cytomegalovirus IgG and IgM   [name]   Mycoplasma and Ureaplasma   Responsible Party   Pool - [facility] Clinical Pool No one has taken responsibility for this message.  No actions have been taken on this message.   Dr.Silva, please review and advise. Will these need to be done with Dr.Yalcinkaya?

## 2016-02-20 NOTE — Telephone Encounter (Signed)
Telephone encounter created to review with Dr.Silva. 

## 2016-02-21 NOTE — Telephone Encounter (Signed)
Me  to Senaida Ores   11:05 AM  Amedeo Gory,   Dr.Silva has reviewed your MyChart message and is recommending that you and your husband be seen at the health department for all of this testing.  This is going to be the most efficient and proper way for the testing to be done. We are not able to perform testing here for you and your husband as he is not able to be a patient here since we are a gynecology practice. I will try to reach you via telephone to discuss this as well. Please let us know if you have any further questions.   [facility] [employer] health department in [place]  Address: [address], [place], [place] [address]  Hours: Open today  8AM-5PM  Phone: [phone]   Sincerely,   Rolla Etienne, RN    Last read by Senaida Ores at 12:14 PM on 02/21/2016.    Dr.Silva, patient has read MyChart message with recommendations. I attempted to reach her by phone with no answer or return call. Okay to close encounter?

## 2016-02-21 NOTE — Telephone Encounter (Signed)
Ok to close encounter. 

## 2016-02-21 NOTE — Telephone Encounter (Signed)
I am recommending that the patient and her husband present to the health department for all of this testing.  This is going to be the most efficient and proper way for her testing to be done.  We are not able to perform testing here for her husband as he is not able to be a patient here.

## 2016-02-21 NOTE — Telephone Encounter (Signed)
Attempted to reach patient at number provided, [phone], okay per ROI. There was no answer and recording states that "The person you have reached has a voicemail box that has not been set up yet." MyChart message sent to patient with Dr.Silva's recommendations.

## 2016-02-25 DIAGNOSIS — Z01419 Encounter for gynecological examination (general) (routine) without abnormal findings: Secondary | ICD-10-CM | POA: Diagnosis not present

## 2016-03-03 DIAGNOSIS — Z01419 Encounter for gynecological examination (general) (routine) without abnormal findings: Secondary | ICD-10-CM | POA: Diagnosis not present

## 2016-03-12 ENCOUNTER — Other Ambulatory Visit (INDEPENDENT_AMBULATORY_CARE_PROVIDER_SITE_OTHER): Payer: 59

## 2016-03-12 DIAGNOSIS — E063 Autoimmune thyroiditis: Secondary | ICD-10-CM | POA: Diagnosis not present

## 2016-03-12 DIAGNOSIS — E038 Other specified hypothyroidism: Secondary | ICD-10-CM

## 2016-03-12 LAB — T4, FREE: FREE T4: 1.68 ng/dL — AB (ref 0.60–1.60)

## 2016-03-12 LAB — TSH: TSH: 0.05 u[IU]/mL — ABNORMAL LOW (ref 0.35–4.50)

## 2016-03-13 ENCOUNTER — Encounter: Payer: Self-pay | Admitting: Internal Medicine

## 2016-03-13 LAB — THYROID PEROXIDASE ANTIBODY: Thyroperoxidase Ab SerPl-aCnc: 707 IU/mL — ABNORMAL HIGH (ref <9)

## 2016-03-13 LAB — THYROGLOBULIN ANTIBODY

## 2016-03-22 ENCOUNTER — Other Ambulatory Visit: Payer: Self-pay | Admitting: Obstetrics and Gynecology

## 2016-03-23 NOTE — Telephone Encounter (Signed)
Medication refill request: Drospirenone-Ethinyl Esradiol Last AEX:  04/11/15 BS Next AEX: 04/22/16 BS Last MMG (if hormonal medication request): n/a Refill authorized: 04/17/15 #3 3R. Please advise. Thank you.

## 2016-03-30 ENCOUNTER — Other Ambulatory Visit: Payer: Self-pay

## 2016-03-30 MED ORDER — SYNTHROID 112 MCG PO TABS: ORAL_TABLET | ORAL | 0 refills | Status: DC

## 2016-04-01 ENCOUNTER — Encounter: Payer: Self-pay | Admitting: Family Medicine

## 2016-04-12 ENCOUNTER — Encounter: Payer: Self-pay | Admitting: Internal Medicine

## 2016-04-13 DIAGNOSIS — E559 Vitamin D deficiency, unspecified: Secondary | ICD-10-CM | POA: Diagnosis not present

## 2016-04-13 DIAGNOSIS — Z1329 Encounter for screening for other suspected endocrine disorder: Secondary | ICD-10-CM | POA: Diagnosis not present

## 2016-04-20 NOTE — Progress Notes (Signed)
36 y.o. G30P1021 Married Caucasian female here for annual exam.    Tried frozen embryo in Feb. In [place] and did not take.  Will try again in May.   Seeing endocrinology for thyroid dysfunction.  Is alternating dosages of Synthroid.   Saw Dr. Diamond Nickel for [facility] consultation and had TFTs checked beginning of this month.  Also did high risk OB consultation.   Husband saw urologist for varicocele.  SA was not repeated.   Menses normal. Stopped OCPs in preparation for IVF but has now restarted again.  Has some headache leading up to menses when off of her OCPs. Does not need refill at this time.   Taking PNV and folic acid.  Taking some vit D also.   Has scaling of her ears and behind her ears.   PCP: Howard Pouch, DO    Patient's last menstrual period was 04/05/2016 (exact date).     Period Cycle (Days): 30 Period Duration (Days): 3-4 Period Pattern: Regular Menstrual Flow:  (light to moderate) Menstrual Control: Maxi pad Menstrual Control Change Freq (Hours): every 8 hours Dysmenorrhea: (!) Mild Dysmenorrhea Symptoms: Cramping, Headache (headaches right before cycle starts)     Sexually active: Yes.   female The current method of family planning is OCP (estrogen/progesterone)--Yasmin    Exercising: Yes.    aerobics Smoker:  no  Health Maintenance: Pap: 04-11-15 Neg:Neg HR HPV  History of abnormal Pap:  no MMG:  n/a Colonoscopy:  n/a BMD:   n/a  Result  n/a TDaP:  11-26-14 Gardasil:   N/A   Screening Labs:  Hb today: not done, Urine today: not done   reports that she has never smoked. She has never used smokeless tobacco. She reports that she does not drink alcohol or use drugs.  Past Medical History:  Diagnosis Date  . Lumbar herniated disc   . MTHFR mutation ([place])   . PCOS (polycystic ovarian syndrome)   . Thyroid disease    hypothyroid.     Past Surgical History:  Procedure Laterality Date  . CESAREAN SECTION      Current Outpatient Prescriptions  Medication  Sig Dispense Refill  . drospirenone-ethinyl estradiol (YASMIN,ZARAH,SYEDA) 3-0.03 MG tablet TAKE 1 TABLET BY MOUTH DAILY. 84 tablet 0  . SYNTHROID 112 MCG tablet Take tablet 6 out of the 7 days. 90 tablet 0   No current facility-administered medications for this visit.     Family History  Problem Relation Age of Onset  . Hypertension Father   . Alzheimer's disease Paternal Grandmother   . Breast cancer Maternal Aunt   . Breast cancer Paternal Aunt   . Alzheimer's disease Maternal Grandmother   . Lung cancer Maternal Grandfather   . Liver cancer Paternal Grandfather     melenoma metz  . Melanoma Paternal Grandfather     ROS:  Pertinent items are noted in HPI.  Otherwise, a comprehensive ROS was negative.  Exam:   BP 120/76 (BP Location: Right Arm, Patient Position: Sitting, Cuff Size: Normal)   Pulse 84   Resp (!) 24   Ht 5\' 7"  (1.702 m)   Wt 168 lb 12.8 oz (76.6 kg)   LMP 04/05/2016 (Exact Date)   BMI 26.44 kg/m     General appearance: alert, cooperative and appears stated age Head: Normocephalic, without obvious abnormality, atraumatic Neck: no adenopathy, supple, symmetrical, trachea midline and thyroid normal to inspection and palpation Lungs: clear to auscultation bilaterally Breasts: normal appearance, no masses or tenderness, No nipple retraction or dimpling, No  nipple discharge or bleeding, No axillary or supraclavicular adenopathy Heart: regular rate and rhythm Abdomen: soft, non-tender; no masses, no organomegaly Extremities: extremities normal, atraumatic, no cyanosis or edema Skin: Skin color, texture, turgor normal. No rashes or lesions Lymph nodes: Cervical, supraclavicular, and axillary nodes normal. No abnormal inguinal nodes palpated Neurologic: Grossly normal  Pelvic: External genitalia:  no lesions              Urethra:  normal appearing urethra with no masses, tenderness or lesions              Bartholins and Skenes: normal                 Vagina:  normal appearing vagina with normal color and discharge, no lesions              Cervix: no lesions              Pap taken: Yes.   Bimanual Exam:  Uterus:  normal size, contour, position, consistency, mobility, non-tender              Adnexa: no mass, fullness, tenderness              Rectal exam: No.  Chaperone was present for exam.  Assessment:   Well woman visit with normal exam. Hashimoto's thyroiditis.  MTHFR mutation.  Secondary infertility.   Plan: Mammogram screening discussed. Recommended self breast awareness. Pap and HR HPV as above.  Pap done today with HR HPV testing as patient is doing IVF protocol. Guidelines for Calcium, Vitamin D, regular exercise program including cardiovascular and weight bearing exercise. She will see her dermatologist in May when she goes to [place]. Follow up annually and prn.       After visit summary provided.

## 2016-04-22 ENCOUNTER — Ambulatory Visit (INDEPENDENT_AMBULATORY_CARE_PROVIDER_SITE_OTHER): Payer: 59 | Admitting: Obstetrics and Gynecology

## 2016-04-22 ENCOUNTER — Encounter: Payer: Self-pay | Admitting: Obstetrics and Gynecology

## 2016-04-22 VITALS — BP 120/76 | HR 84 | Resp 24 | Ht 67.0 in | Wt 168.8 lb

## 2016-04-22 DIAGNOSIS — Z01419 Encounter for gynecological examination (general) (routine) without abnormal findings: Secondary | ICD-10-CM

## 2016-04-22 LAB — LIPID PANEL
Cholesterol: 191 mg/dL (ref <200)
HDL: 65 mg/dL (ref >50)
LDL CALC: 101 mg/dL — AB (ref <100)
Total CHOL/HDL Ratio: 2.9 Ratio (ref <5.0)
Triglycerides: 127 mg/dL (ref <150)
VLDL: 25 mg/dL (ref <30)

## 2016-04-22 LAB — COMPREHENSIVE METABOLIC PANEL
ALT: 19 U/L (ref 6–29)
AST: 19 U/L (ref 10–30)
Albumin: 4.4 g/dL (ref 3.6–5.1)
Alkaline Phosphatase: 43 U/L (ref 33–115)
BILIRUBIN TOTAL: 0.4 mg/dL (ref 0.2–1.2)
BUN: 9 mg/dL (ref 7–25)
CO2: 26 mmol/L (ref 20–31)
Calcium: 9.5 mg/dL (ref 8.6–10.2)
Chloride: 102 mmol/L (ref 98–110)
Creat: 0.79 mg/dL (ref 0.50–1.10)
GLUCOSE: 68 mg/dL (ref 65–99)
Potassium: 4.1 mmol/L (ref 3.5–5.3)
SODIUM: 138 mmol/L (ref 135–146)
Total Protein: 7.6 g/dL (ref 6.1–8.1)

## 2016-04-22 LAB — CBC
HCT: 41.4 % (ref 35.0–45.0)
HEMOGLOBIN: 13.6 g/dL (ref 11.7–15.5)
MCH: 28.2 pg (ref 27.0–33.0)
MCHC: 32.9 g/dL (ref 32.0–36.0)
MCV: 85.7 fL (ref 80.0–100.0)
MPV: 10.3 fL (ref 7.5–12.5)
PLATELETS: 248 10*3/uL (ref 140–400)
RBC: 4.83 MIL/uL (ref 3.80–5.10)
RDW: 13.6 % (ref 11.0–15.0)
WBC: 8.5 10*3/uL (ref 3.8–10.8)

## 2016-04-22 NOTE — Patient Instructions (Signed)

## 2016-04-23 LAB — VITAMIN D 25 HYDROXY (VIT D DEFICIENCY, FRACTURES): VIT D 25 HYDROXY: 37 ng/mL (ref 30–100)

## 2016-04-24 LAB — IPS PAP TEST WITH HPV

## 2016-05-25 ENCOUNTER — Encounter: Payer: Self-pay | Admitting: Internal Medicine

## 2016-05-28 ENCOUNTER — Other Ambulatory Visit (INDEPENDENT_AMBULATORY_CARE_PROVIDER_SITE_OTHER): Payer: 59

## 2016-05-28 DIAGNOSIS — E063 Autoimmune thyroiditis: Secondary | ICD-10-CM

## 2016-05-28 DIAGNOSIS — E038 Other specified hypothyroidism: Secondary | ICD-10-CM

## 2016-05-28 LAB — T4, FREE: FREE T4: 0.79 ng/dL (ref 0.60–1.60)

## 2016-05-28 LAB — TSH: TSH: 5.44 u[IU]/mL — AB (ref 0.35–4.50)

## 2016-05-29 ENCOUNTER — Other Ambulatory Visit: Payer: Self-pay | Admitting: Internal Medicine

## 2016-05-29 ENCOUNTER — Encounter: Payer: Self-pay | Admitting: Internal Medicine

## 2016-05-29 DIAGNOSIS — E038 Other specified hypothyroidism: Secondary | ICD-10-CM

## 2016-05-29 DIAGNOSIS — E063 Autoimmune thyroiditis: Principal | ICD-10-CM

## 2016-05-29 MED ORDER — SYNTHROID 125 MCG PO TABS: 125.0000 ug | ORAL_TABLET | Freq: Every day | ORAL | 2 refills | Status: DC

## 2016-06-10 ENCOUNTER — Encounter: Payer: Self-pay | Admitting: Family Medicine

## 2016-06-10 ENCOUNTER — Ambulatory Visit (INDEPENDENT_AMBULATORY_CARE_PROVIDER_SITE_OTHER): Payer: 59 | Admitting: Family Medicine

## 2016-06-10 VITALS — BP 122/85 | HR 87 | Temp 97.9°F | Resp 20 | Wt 173.0 lb

## 2016-06-10 DIAGNOSIS — R350 Frequency of micturition: Secondary | ICD-10-CM

## 2016-06-10 DIAGNOSIS — R8290 Unspecified abnormal findings in urine: Secondary | ICD-10-CM | POA: Diagnosis not present

## 2016-06-10 LAB — POC URINALSYSI DIPSTICK (AUTOMATED)
Bilirubin, UA: NEGATIVE
Glucose, UA: NEGATIVE
Ketones, UA: NEGATIVE
Nitrite, UA: NEGATIVE
PH UA: 6 (ref 5.0–8.0)
PROTEIN UA: 30
SPEC GRAV UA: 1.02 (ref 1.010–1.025)
Urobilinogen, UA: 0.2 E.U./dL

## 2016-06-10 MED ORDER — CEPHALEXIN 500 MG PO CAPS: 500.0000 mg | ORAL_CAPSULE | Freq: Three times a day (TID) | ORAL | 0 refills | Status: DC

## 2016-06-10 NOTE — Progress Notes (Signed)
Abigail Kelley , 07/30/1980, 36 y.o., female MRN: [id number] Patient Care Team    Relationship Specialty Notifications Start End  Ma Hillock, DO PCP - General Family Medicine  06/19/15   Nunzio Cobbs, MD Consulting Physician Obstetrics and Gynecology  06/19/15     Chief Complaint  Patient presents with  . Dysuria     Subjective: Pt presents for an OV with complaints of dysuria of 5 days duration.  Associated symptoms include some mild suprapubic pressure.  She denies fever, chills, nausea, low back pain.  She is preparing to leave the country in 1 week back to [place] for embryo implantation.    Depression screen PHQ 2/9 06/19/2015  Decreased Interest 0  Down, Depressed, Hopeless 0  PHQ - 2 Score 0    No Known Allergies Social History  Substance Use Topics  . Smoking status: Never Smoker  . Smokeless tobacco: Never Used  . Alcohol use No   Past Medical History:  Diagnosis Date  . Lumbar herniated disc   . MTHFR mutation ([place])   . PCOS (polycystic ovarian syndrome)   . Thyroid disease    hypothyroid.    Past Surgical History:  Procedure Laterality Date  . CESAREAN SECTION     Family History  Problem Relation Age of Onset  . Hypertension Father   . Alzheimer's disease Paternal Grandmother   . Breast cancer Maternal Aunt   . Breast cancer Paternal Aunt   . Alzheimer's disease Maternal Grandmother   . Lung cancer Maternal Grandfather   . Liver cancer Paternal Grandfather     melenoma metz  . Melanoma Paternal Grandfather    Allergies as of 06/10/2016   No Known Allergies     Medication List       Accurate as of 06/10/16  1:51 PM. Always use your most recent med list.          drospirenone-ethinyl estradiol 3-0.03 MG tablet Commonly known as:  YASMIN,ZARAH,SYEDA TAKE 1 TABLET BY MOUTH DAILY.   enoxaparin 40 MG/0.4ML injection Commonly known as:  LOVENOX 40 mg.   estradiol 2 MG tablet Commonly known as:  ESTRACE   multivitamin  capsule Take 1 capsule by mouth daily.   SYNTHROID 125 MCG tablet Generic drug:  levothyroxine Take 1 tablet (125 mcg total) by mouth daily before breakfast.       All past medical history, surgical history, allergies, family history, immunizations andmedications were updated in the EMR today and reviewed under the history and medication portions of their EMR.     ROS: Negative, with the exception of above mentioned in HPI   Objective:  BP 122/85 (BP Location: Left Arm, Patient Position: Sitting, Cuff Size: Large)   Pulse 87   Temp 97.9 F (36.6 C)   Resp 20   Wt 173 lb (78.5 kg)   SpO2 97%   BMI 27.10 kg/m  Body mass index is 27.1 kg/m. Gen: Afebrile. No acute distress. Nontoxic in appearance, well developed, well nourished. Very pleasant caucasian female.  HENT: AT. [place]. MMM, no oral lesions.  Eyes:Pupils Equal Round Reactive to light, Extraocular movements intact,  Conjunctiva without redness, discharge or icterus. CV: RRR  Chest: CTAB, no wheeze or crackles.  Abd: Soft. NTND. BS present.  MSK: No CVA tenderness Neuro: Normal gait. PERLA. EOMi. Alert. Oriented x3   No exam data present No results found. Results for orders placed or performed in visit on 06/10/16 (from the past 24 hour(s))  POCT  Urinalysis Dipstick (Automated)     Status: Abnormal   Collection Time: 06/10/16  1:27 PM  Result Value Ref Range   Color, UA yellow    Clarity, UA cloudy    Glucose, UA negative    Bilirubin, UA negative    Ketones, UA negative    Spec Grav, UA 1.020 1.010 - 1.025   Blood, UA large    pH, UA 6.0 5.0 - 8.0   Protein, UA 30    Urobilinogen, UA 0.2 0.2 or 1.0 E.U./dL   Nitrite, UA negative    Leukocytes, UA Large (3+) (A) Negative    Assessment/Plan: Abigail Kelley is a 36 y.o. female present for OV for dysuria.  Urinary frequency/abnormal urine.  - POCT Urinalysis Dipstick (Automated) - Urine Culture - keflex TID for 7 days  - encourage water/cranberry - F/u  PRN   Reviewed expectations re: course of current medical issues.  Discussed self-management of symptoms.  Outlined signs and symptoms indicating need for more acute intervention.  Patient verbalized understanding and all questions were answered.  Patient received an After-Visit Summary.     Note is dictated utilizing voice recognition software. Although note has been proof read prior to signing, occasional typographical errors still can be missed. If any questions arise, please do not hesitate to call for verification.   electronically signed by:  Howard Pouch, DO  [place]

## 2016-06-10 NOTE — Patient Instructions (Signed)
Start keflex every 8 hours for 7 days.  I will call you with results in about 3 days once I receive it.

## 2016-06-12 ENCOUNTER — Telehealth: Payer: Self-pay | Admitting: Family Medicine

## 2016-06-12 ENCOUNTER — Ambulatory Visit (INDEPENDENT_AMBULATORY_CARE_PROVIDER_SITE_OTHER): Payer: 59 | Admitting: Obstetrics and Gynecology

## 2016-06-12 ENCOUNTER — Ambulatory Visit: Payer: 59 | Admitting: Family Medicine

## 2016-06-12 ENCOUNTER — Telehealth: Payer: Self-pay | Admitting: Obstetrics and Gynecology

## 2016-06-12 ENCOUNTER — Encounter: Payer: Self-pay | Admitting: Family Medicine

## 2016-06-12 ENCOUNTER — Encounter: Payer: Self-pay | Admitting: Obstetrics and Gynecology

## 2016-06-12 VITALS — BP 122/70 | HR 72 | Resp 16 | Wt 173.0 lb

## 2016-06-12 DIAGNOSIS — N760 Acute vaginitis: Secondary | ICD-10-CM

## 2016-06-12 LAB — URINE CULTURE

## 2016-06-12 MED ORDER — FLUCONAZOLE 150 MG PO TABS: 150.0000 mg | ORAL_TABLET | Freq: Once | ORAL | 0 refills | Status: AC

## 2016-06-12 NOTE — Telephone Encounter (Signed)
Spoke with patient. Patient states that she is experiencing vaginal burning, itching and discomfort. Was seen with her PCP on 06/10/2016 and was started on Keflex 500 mg 3 times daily for UTI. Patient states symptoms are persisting and now feel she has a yeast infection. Denies lower back pain, fever, or chills. Patient is scheduled for an appointment today at 3:30 pm with Dr.Silva. Aware she will need to keep this appointment as scheduled.  Routing to provider for final review. Patient agreeable to disposition. Will close encounter.

## 2016-06-12 NOTE — Telephone Encounter (Signed)
Patient sent the following message through MyChart:  Appointment Request From: Abigail Kelley    With Provider: Arloa Koh, MD [facility]]    Preferred Date Range: Any date 06/12/2016 or later    Preferred Times: Any    Reason for visit: Office Visit    Comments:  Dr Quincy Simmonds,  I am looking to do a sick visit, as I am feeling some burning and discomfort , and would like to get it checked... I will travel internationally on Saturday, so would it be possible to come in on Friday May 4th?  tks  Abigail Kelley   I called the patient and she scheduled an appointment with Dr. Quincy Simmonds this afternoon, 06/12/16, at 3:30 PM. She'd like a nurse to call her back to review her symptoms prior to the appointment.

## 2016-06-12 NOTE — Telephone Encounter (Signed)
Spoke with patient reviewed lab results and instruction.

## 2016-06-12 NOTE — Telephone Encounter (Signed)
Please call pt: - her urine culture grew E.Coli. The medication provided will cover the infection.  Hope her trip to [place] goes well!

## 2016-06-12 NOTE — Telephone Encounter (Signed)
Patient has scheduled an appt for this am

## 2016-06-12 NOTE — Progress Notes (Signed)
GYNECOLOGY  VISIT   HPI: 36 y.o.   Married  [place]  female   [phone] with Patient's last menstrual period was 06/03/2016.   here for vaginal burning; patient went to PCP 06/10/16 for discomfort, burning (unsure if it was vaginal or urine); PCP checked urine and sent for urine culture. Started patient on Keflex abx. Per patient urinary burning is better, but now has vaginal discomfort and burning sensation.  E Coli UTI 10 - 50,000 colonies.   Traveling to [place] for embryo transfer on 06/18/16.  She has been doing her preparatory care through Dr. Kerin Perna.   GYNECOLOGIC HISTORY: Patient's last menstrual period was 06/03/2016. Contraception:  None -- patient not taking OCP Menopausal hormone therapy:  n/a Last mammogram:  n/a Last pap smear:    04-11-15 Neg:Neg HR HPV         OB History    Gravida Para Term Preterm AB Living   3 1 1   2 1    SAB TAB Ectopic Multiple Live Births   2                 Patient Active Problem List   Diagnosis Date Noted  . Hypothyroidism due to Hashimoto's thyroiditis 06/19/2015  . 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 History of MTHFR mutation 04/13/2015    Past Medical History:  Diagnosis Date  . Lumbar herniated disc   . MTHFR mutation ([place])   . PCOS (polycystic ovarian syndrome)   . Thyroid disease    hypothyroid.     Past Surgical History:  Procedure Laterality Date  . CESAREAN SECTION      Current Outpatient Prescriptions  Medication Sig Dispense Refill  . cephALEXin (KEFLEX) 500 MG capsule Take 1 capsule (500 mg total) by mouth 3 (three) times daily. 21 capsule 0  . drospirenone-ethinyl estradiol (YASMIN,ZARAH,SYEDA) 3-0.03 MG tablet TAKE 1 TABLET BY MOUTH DAILY. 84 tablet 0  . enoxaparin (LOVENOX) 40 MG/0.4ML injection 40 mg.    . estradiol (ESTRACE) 2 MG tablet     . Multiple Vitamin (MULTIVITAMIN) capsule Take 1 capsule by mouth daily.    Marland Kitchen SYNTHROID 125 MCG tablet Take 1 tablet (125 mcg total) by mouth daily before breakfast. 60 tablet  2   No current facility-administered medications for this visit.      ALLERGIES: Patient has no known allergies.  Family History  Problem Relation Age of Onset  . Hypertension Father   . Alzheimer's disease Paternal Grandmother   . Breast cancer Maternal Aunt   . Breast cancer Paternal Aunt   . Alzheimer's disease Maternal Grandmother   . Lung cancer Maternal Grandfather   . Liver cancer Paternal Grandfather     melenoma metz  . Melanoma Paternal Grandfather     Social History   Social History  . Marital status: Married    Spouse name: N/A  . Number of children: N/A  . Years of education: N/A   Occupational History  . Not on file.   Social History Main Topics  . Smoking status: Never Smoker  . Smokeless tobacco: Never Used  . Alcohol use No  . Drug use: No  . Sexual activity: Yes    Birth control/ protection: Pill     Comment: Yasmin   Other Topics Concern  . Not on file   Social History Narrative   Married. [name] is her husband. 1 Child Verdis Frederickson).   Moved from [place] (11/2014)   MBA, Human Resources.    Drinks caffeine   Wears  her seatbelt, wears her bike helmet   Smoke detector in the home.    Feels safe in her relationships.        ROS:  Pertinent items are noted in HPI.  PHYSICAL EXAMINATION:    BP 122/70 (BP Location: Right Arm, Patient Position: Sitting, Cuff Size: Normal)   Pulse 72   Resp 16   Wt 173 lb (78.5 kg)   LMP 06/03/2016   BMI 27.10 kg/m     General appearance: alert, cooperative and appears stated age   Pelvic: External genitalia:  no lesions              Urethra:  normal appearing urethra with no masses, tenderness or lesions              Bartholins and Skenes: normal                 Vagina: normal appearing vagina with normal color and discharge, no lesions.  Lots of clear mucous from the cervical os.               Cervix: no lesions                Bimanual Exam:  Uterus:  normal size, contour, position, consistency, mobility,  non-tender              Adnexa: no mass, fullness, tenderness         Wet prep - pH 4.0. Very few hyphae.  No clue cells.  No trich.  Chaperone was present for exam.  ASSESSMENT  E Coli UTI.  On Keflex.  Vaginitis. Looks like mild Candida.  PLAN  Affirm also sent for confirmation.  Finish Keflex.  Diflucan 150 mg po now.  Ok to repeat second dose if needed in 72 hours.  Do not take if not needed. Best wishes for a successful embryo transfer in [place]! Follow up prn.   An After Visit Summary was printed and given to the patient.  ___15___ minutes face to face time of which over 50% was spent in counseling.

## 2016-06-12 NOTE — Patient Instructions (Signed)
Vaginitis Vaginitis is an inflammation of the vagina. It is most often caused by a change in the normal balance of the bacteria and yeast that live in the vagina. This change in balance causes an overgrowth of certain bacteria or yeast, which causes the inflammation. There are different types of vaginitis, but the most common types are:  Bacterial vaginosis.  Yeast infection (candidiasis).  Trichomoniasis vaginitis. This is a sexually transmitted infection (STI).  Viral vaginitis.  Atrophic vaginitis.  Allergic vaginitis. What are the causes? The cause depends on the type of vaginitis. Vaginitis can be caused by:  Bacteria (bacterial vaginosis).  Yeast (yeast infection).  A parasite (trichomoniasis vaginitis)  A virus (viral vaginitis).  Low hormone levels (atrophic vaginitis). Low hormone levels can occur during pregnancy, breastfeeding, or after menopause.  Irritants, such as bubble baths, scented tampons, and feminine sprays (allergic vaginitis). Other factors can change the normal balance of the yeast and bacteria that live in the vagina. These include:  Antibiotic medicines.  Poor hygiene.  Diaphragms, vaginal sponges, spermicides, birth control pills, and intrauterine devices (IUD).  Sexual intercourse.  Infection.  Uncontrolled diabetes.  A weakened immune system. What are the signs or symptoms? Symptoms can vary depending on the cause of the vaginitis. Common symptoms include:  Abnormal vaginal discharge.  The discharge is white, gray, or yellow with bacterial vaginosis.  The discharge is thick, white, and cheesy with a yeast infection.  The discharge is frothy and yellow or greenish with trichomoniasis.  A bad vaginal odor.  The odor is fishy with bacterial vaginosis.  Vaginal itching, pain, or swelling.  Painful intercourse.  Pain or burning when urinating. Sometimes there are no symptoms. How is this treated? Treatment will vary depending on  the type of infection.  Bacterial vaginosis and trichomoniasis are often treated with antibiotic creams or pills.  Yeast infections are often treated with antifungal medicines, such as vaginal creams or suppositories.  Viral vaginitis has no cure, but symptoms can be treated with medicines that relieve discomfort. Your sexual partner should be treated as well.  Atrophic vaginitis may be treated with an estrogen cream, pill, suppository, or vaginal ring. If vaginal dryness occurs, lubricants and moisturizing creams may help. You may be told to avoid scented soaps, sprays, or douches.  Allergic vaginitis treatment involves quitting the use of the product that is causing the problem. Vaginal creams can be used to treat the symptoms. Follow these instructions at home:  Take all medicines as directed by your caregiver.  Keep your genital area clean and dry. Avoid soap and only rinse the area with water.  Avoid douching. It can remove the healthy bacteria in the vagina.  Do not use tampons or have sexual intercourse until your vaginitis has been treated. Use sanitary pads while you have vaginitis.  Wipe from front to back. This avoids the spread of bacteria from the rectum to the vagina.  Let air reach your genital area. ? Wear cotton underwear to decrease moisture buildup.  Avoid wearing underwear while you sleep until your vaginitis is gone.  Avoid tight pants and underwear or nylons without a cotton panel.  Take off wet clothing (especially bathing suits) as soon as possible.  Use mild, non-scented products. Avoid using irritants, such as:  Scented feminine sprays.  Fabric softeners.  Scented detergents.  Scented tampons.  Scented soaps or bubble baths.  Practice safe sex and use condoms. Condoms may prevent the spread of trichomoniasis and viral vaginitis. Contact a health care  provider if:  You have abdominal pain.  You have symptoms that last for more than 2-3  days.  You have a fever and your symptoms suddenly get worse. This information is not intended to replace advice given to you by your health care provider. Make sure you discuss any questions you have with your health care provider. Document Released: 11/23/2006 Document Revised: 12/18/2015 Document Reviewed: 12/18/2015 Elsevier Interactive Patient Education  2017 [employer].

## 2016-06-13 LAB — WET PREP BY MOLECULAR PROBE
CANDIDA SPECIES: DETECTED — AB
GARDNERELLA VAGINALIS: NOT DETECTED
TRICHOMONAS VAG: NOT DETECTED

## 2016-06-15 ENCOUNTER — Other Ambulatory Visit: Payer: Self-pay | Admitting: Obstetrics and Gynecology

## 2016-06-15 NOTE — Telephone Encounter (Signed)
Medication refill request: OCP  Last AEX:  04-22-16  Next AEX: 04-26-17  Last MMG (if hormonal medication request): N/A Refill authorized: please advise

## 2016-06-26 DIAGNOSIS — L218 Other seborrheic dermatitis: Secondary | ICD-10-CM | POA: Diagnosis not present

## 2016-06-26 DIAGNOSIS — D225 Melanocytic nevi of trunk: Secondary | ICD-10-CM | POA: Diagnosis not present

## 2016-06-26 DIAGNOSIS — D2271 Melanocytic nevi of right lower limb, including hip: Secondary | ICD-10-CM | POA: Diagnosis not present

## 2016-06-27 ENCOUNTER — Other Ambulatory Visit: Payer: Self-pay | Admitting: Internal Medicine

## 2016-07-01 ENCOUNTER — Encounter: Payer: Self-pay | Admitting: Obstetrics and Gynecology

## 2016-07-01 ENCOUNTER — Encounter: Payer: Self-pay | Admitting: Family Medicine

## 2016-07-01 ENCOUNTER — Other Ambulatory Visit: Payer: Self-pay | Admitting: Obstetrics and Gynecology

## 2016-07-01 ENCOUNTER — Telehealth: Payer: Self-pay | Admitting: *Deleted

## 2016-07-01 DIAGNOSIS — N912 Amenorrhea, unspecified: Secondary | ICD-10-CM

## 2016-07-01 NOTE — Telephone Encounter (Signed)
Dr. Quincy Simmonds, please review and advise? OK to schedule?    Visit Follow-Up Question  Message [id number]  From [place], MD Sent 07/01/2016 1:31 PM  Dr Quincy Simmonds,  Can I come in tomorrow to do the blood test for pregnancy?... as you know I did the IVF in [place] and now need to do the test...  I would appreciate it :) I am on vacation and travelling back tomorrow, so would be able to come in at 3pm if possible.  Thank you!  Abigail Kelley

## 2016-07-01 NOTE — Telephone Encounter (Signed)
See telephone encounter dated 07/01/16 to review with provider.

## 2016-07-01 NOTE — Telephone Encounter (Signed)
Patient scheduled for lab appointment on 07/01/16 at 3pm. Notified patient of appointment via [place] to provider for final review. Patient is agreeable to disposition. Will close encounter.

## 2016-07-01 NOTE — Telephone Encounter (Signed)
OK for quantitative hCG tomorrow! I will place a future order.

## 2016-07-02 ENCOUNTER — Other Ambulatory Visit: Payer: Self-pay

## 2016-07-02 ENCOUNTER — Telehealth: Payer: Self-pay | Admitting: Obstetrics and Gynecology

## 2016-07-02 NOTE — Telephone Encounter (Signed)
Ok to close encounter. 

## 2016-07-02 NOTE — Telephone Encounter (Signed)
Attempted to reach patient to reschedule missed lab appointment. No answer and voicemail is not set up.

## 2016-07-03 ENCOUNTER — Telehealth: Payer: Self-pay | Admitting: *Deleted

## 2016-07-03 ENCOUNTER — Encounter: Payer: Self-pay | Admitting: Internal Medicine

## 2016-07-03 ENCOUNTER — Other Ambulatory Visit: Payer: Self-pay | Admitting: Internal Medicine

## 2016-07-03 DIAGNOSIS — E063 Autoimmune thyroiditis: Principal | ICD-10-CM

## 2016-07-03 DIAGNOSIS — N912 Amenorrhea, unspecified: Secondary | ICD-10-CM

## 2016-07-03 DIAGNOSIS — E038 Other specified hypothyroidism: Secondary | ICD-10-CM

## 2016-07-03 NOTE — Telephone Encounter (Signed)
Spoke with patient. Advised she may have labs drawn at [facility], [place] [address] open 8am-12pm on 07/04/16. New order has been placed. Patient verbalizes understanding and is agreeable.     From [place] To Nunzio Cobbs, MD Sent 07/03/2016 1:36 PM  Hello! I am sorry, as I am on vacation did not see this before. Is it possible to do the test tomorrow Saturday -- any time thatbworks for you?  I am out of town today, and please I dont want to wait until Tuesday because of the holiday :)  If not in the office -- because it is Saturday-- could you send me to a lab, like [facility]?..  Thank you!  Amedeo Gory    Dr. Quincy Simmonds, do you agree with recommendations?

## 2016-07-03 NOTE — Telephone Encounter (Signed)
I signed the order for the quant hCG.  You may close the encounter.

## 2016-07-03 NOTE — Telephone Encounter (Signed)
Patient is pregnant and was advised per Dr. Cruzita Lederer to test thyroid right away once pregnant.  Please give a call back to discuss.  Thank you,  -LL

## 2016-07-03 NOTE — Telephone Encounter (Signed)
See telephone encounter dated 07/03/16.

## 2016-07-07 ENCOUNTER — Other Ambulatory Visit (INDEPENDENT_AMBULATORY_CARE_PROVIDER_SITE_OTHER): Payer: 59

## 2016-07-07 DIAGNOSIS — E038 Other specified hypothyroidism: Secondary | ICD-10-CM | POA: Diagnosis not present

## 2016-07-07 DIAGNOSIS — E063 Autoimmune thyroiditis: Secondary | ICD-10-CM | POA: Diagnosis not present

## 2016-07-07 LAB — T4, FREE: FREE T4: 0.98 ng/dL (ref 0.60–1.60)

## 2016-07-07 LAB — TSH: TSH: 2.31 u[IU]/mL (ref 0.35–4.50)

## 2016-07-08 ENCOUNTER — Telehealth: Payer: Self-pay | Admitting: Obstetrics and Gynecology

## 2016-07-08 NOTE — Telephone Encounter (Signed)
Please contact patient in follow up to quant beta hCG testing ordered following her embryo transfer in [place]. I have not received results. From the information I can see in the chart, she already received results???

## 2016-07-09 NOTE — Telephone Encounter (Signed)
Attempted to reach the patient at number provided [phone], there was no answer and recording states that the voicemail box has not been set up yet.

## 2016-07-16 NOTE — Telephone Encounter (Signed)
No patient response to call.  I am closing this encounter.

## 2016-07-21 NOTE — Telephone Encounter (Signed)
I called patient to let Abigail Kelley know that Abigail Kelley 08/20/16 appointment will be the soonest after Dr. Cruzita Lederer comes back from vacation, she said that this would not be soon enough and wants to know if Dr. Cruzita Lederer can see Abigail Kelley before she leaves, I told Abigail Kelley we would check.

## 2016-07-22 ENCOUNTER — Other Ambulatory Visit: Payer: Self-pay | Admitting: Internal Medicine

## 2016-07-22 MED ORDER — SYNTHROID 150 MCG PO TABS: 150.0000 ug | ORAL_TABLET | Freq: Every day | ORAL | 2 refills | Status: DC

## 2016-07-29 ENCOUNTER — Encounter: Payer: Self-pay | Admitting: Obstetrics and Gynecology

## 2016-08-11 DIAGNOSIS — Z1329 Encounter for screening for other suspected endocrine disorder: Secondary | ICD-10-CM | POA: Diagnosis not present

## 2016-08-11 LAB — OB RESULTS CONSOLE RUBELLA ANTIBODY, IGM: Rubella: IMMUNE

## 2016-08-11 LAB — OB RESULTS CONSOLE GC/CHLAMYDIA
Chlamydia: NEGATIVE
Gonorrhea: NEGATIVE

## 2016-08-11 LAB — OB RESULTS CONSOLE ANTIBODY SCREEN: ANTIBODY SCREEN: NEGATIVE

## 2016-08-11 LAB — OB RESULTS CONSOLE ABO/RH: RH TYPE: POSITIVE

## 2016-08-11 LAB — OB RESULTS CONSOLE HEPATITIS B SURFACE ANTIGEN: Hepatitis B Surface Ag: NEGATIVE

## 2016-08-11 LAB — OB RESULTS CONSOLE HIV ANTIBODY (ROUTINE TESTING): HIV: NONREACTIVE

## 2016-08-11 LAB — OB RESULTS CONSOLE RPR: RPR: NONREACTIVE

## 2016-08-20 ENCOUNTER — Ambulatory Visit (INDEPENDENT_AMBULATORY_CARE_PROVIDER_SITE_OTHER): Payer: 59 | Admitting: Internal Medicine

## 2016-08-20 ENCOUNTER — Encounter: Payer: Self-pay | Admitting: Internal Medicine

## 2016-08-20 VITALS — BP 124/80 | HR 96 | Wt 180.0 lb

## 2016-08-20 DIAGNOSIS — E038 Other specified hypothyroidism: Secondary | ICD-10-CM

## 2016-08-20 DIAGNOSIS — E063 Autoimmune thyroiditis: Secondary | ICD-10-CM

## 2016-08-20 LAB — TSH: TSH: 1.41 u[IU]/mL (ref 0.35–4.50)

## 2016-08-20 LAB — T4, FREE: FREE T4: 0.98 ng/dL (ref 0.60–1.60)

## 2016-08-20 MED ORDER — SYNTHROID 150 MCG PO TABS: 150.0000 ug | ORAL_TABLET | Freq: Every day | ORAL | 2 refills | Status: DC

## 2016-08-20 NOTE — Patient Instructions (Signed)
Please stop at the lab.  Please continue Levothyroxine 150 mcg daily.  Take the thyroid hormone every day, with water, at least 30 minutes before breakfast, separated by at least 4 hours from: - acid reflux medications - calcium - iron - multivitamins  Please return in 4 months.

## 2016-08-20 NOTE — Progress Notes (Signed)
Patient ID: Abigail Kelley, female   DOB: Jun 21, 1980, 36 y.o.   MRN: [id number]    HPI  Abigail Kelley is a 36 y.o.-year-old female, initially referred by her PCP, Dr. Raoul Pitch, returning for management of Hashimoto's hypothyroidism. She moved here from [place] in 09/2014. Last visit 6 mo ago. ObGyn: Dr. Quincy Simmonds  She is now pregnant after IVF in [place] >> week 12. She still has nausea. Takes a med for morning sickness. She had 2 miscarriages in 2012. In 11/2012, her IVF was successful >> healthy girl. She has a h/o PCOS. Her husband's sperm count was low.  Pt. has been dx with hypothyroidism in 2010 in [place] >> on Levothyroxine 112, now dose increased to 150 on 07/03/2016 with the pregnancy..  Pt takes the levothyroxine 150 mcg: - in am - fasting - at least 30 min from b'fast - no Ca, Fe, PPIs - + MVI at night - not on Biotin  She did vomit few tabs, but not in last 1.5 weeks.  I reviewed pt's thyroid tests: Lab Results  Component Value Date   TSH 2.31 07/07/2016   TSH 5.44 (H) 05/28/2016   TSH 0.05 (L) 03/12/2016   TSH 3.10 02/07/2016   TSH 7.83 (H) 01/23/2016   TSH 2.93 08/29/2015   TSH 4.09 06/07/2015   TSH 0.13 (L) 04/11/2015   FREET4 0.98 07/07/2016   FREET4 0.79 05/28/2016   FREET4 1.68 (H) 03/12/2016   FREET4 1.3 02/07/2016   FREET4 1.2 08/29/2015    Component     Latest Ref Rng & Units 02/07/2016 03/12/2016  Thyrotropin Receptor Ab     <=16.0 % 9.4   TSI     <140 % baseline <89   Thyroglobulin Ab     <2 IU/mL  <1  Thyroperoxidase Ab SerPl-aCnc     <9 IU/mL  707 (H)   She also had a thyroid ultrasound in 02/07/2016: Slightly diminutive and markedly heterogeneous appearing thyroid gland without discrete nodule or mass.  Pt denies: - feeling nodules in neck - hoarseness - dysphagia - choking - SOB with lying down  She has no FH of thyroid disorders. No FH of thyroid cancer. No h/o radiation tx to head or neck.  No seaweed or kelp. No recent contrast  studies. No herbal supplements. No Biotin use. No recent steroids use.   Pt. also has a history of PCOS, Lumbar herniated disk. C section 2014.  She takes Lovenox for this pregnancy. On Progesterone vaginal capsules.   ROS: Constitutional: no weight gain/no weight loss, no fatigue, no subjective hyperthermia, no subjective hypothermia Eyes: no blurry vision, no xerophthalmia ENT: no sore throat, no nodules palpated in throat, no dysphagia, no odynophagia, no hoarseness Cardiovascular: no CP/no SOB/no palpitations/no leg swelling Respiratory: no cough/no SOB/no wheezing Gastrointestinal: + N/+ V/no D/no C/no acid reflux Musculoskeletal: no muscle aches/no joint aches Skin: no rashes, no hair loss Neurological: no tremors/no numbness/no tingling/no dizziness  I reviewed pt's medications, allergies, PMH, social hx, family hx, and changes were documented in the history of present illness. Otherwise, unchanged from my initial visit note.  Past Medical History:  Diagnosis Date  . Lumbar herniated disc   . MTHFR mutation ([place])   . PCOS (polycystic ovarian syndrome)   . Thyroid disease    hypothyroid.    Past Surgical History:  Procedure Laterality Date  . CESAREAN SECTION     Social History   Social History  . Marital status: Married    Spouse name: N/A  .  Number of children: 1   Occupational History  . Human resources   Social History Main Topics  . Smoking status: Never Smoker  . Smokeless tobacco: Never Used  . Alcohol use No  . Drug use: No  . Sexual activity: Yes    Birth control/ protection: Pill   Social History Narrative   Married. [name] - husband. [place].   Moved from [place] (11/2014)   MBA, Human Resources.    Current Outpatient Prescriptions on File Prior to Visit  Medication Sig Dispense Refill  . enoxaparin (LOVENOX) 40 MG/0.4ML injection 40 mg.    . Multiple Vitamin (MULTIVITAMIN) capsule Take 1 capsule by mouth daily.    Marland Kitchen SYNTHROID 150 MCG  tablet Take 1 tablet (150 mcg total) by mouth daily before breakfast. 30 tablet 2   No current facility-administered medications on file prior to visit.    No Known Allergies Family History  Problem Relation Age of Onset  . Hypertension Father   . Alzheimer's disease Paternal Grandmother   . Breast cancer Maternal Aunt   . Breast cancer Paternal Aunt   . Alzheimer's disease Maternal Grandmother   . Lung cancer Maternal Grandfather   . Liver cancer Paternal Grandfather        melenoma metz  . Melanoma Paternal Grandfather    PE: BP 124/80 (BP Location: Left Arm, Patient Position: Sitting)   Pulse 96   Wt 180 lb (81.6 kg)   LMP 05/30/2016   SpO2 97%   BMI 28.19 kg/m  Wt Readings from Last 3 Encounters:  08/20/16 180 lb (81.6 kg)  06/12/16 173 lb (78.5 kg)  06/10/16 173 lb (78.5 kg)   Constitutional: slightly overweight - pregnant appearing, in NAD Eyes: PERRLA, EOMI, no exophthalmos ENT: moist mucous membranes, no thyromegaly, no cervical lymphadenopathy Cardiovascular: tachycardia, RR, No MRG, + mild periankle edema Respiratory: CTA B Gastrointestinal: abdomen soft, NT, ND, BS+ Musculoskeletal: no deformities, strength intact in all 4 Skin: moist, warm, no rashes Neurological: no tremor with outstretched hands, DTR normal in all 4  ASSESSMENT: 1. Hashimoto's Hypothyroidism  PLAN:  1. Patient with long-standing hypothyroidism, on LT4, now pregnant, in 1st trimester (week 55). She is nauseated from her pregnancy, but no other complaints. We increased her LT4 to 150 on 07/03/2016. She has been on this dose with controlled TFTs during last pregnancy. - latest thyroid labs reviewed with pt >> normal  - we discussed about taking the thyroid hormone every day, with water, >30 minutes before breakfast, separated by >4 hours from acid reflux medications, calcium, iron, multivitamins. Pt. is taking it correctly - will check thyroid tests today: TSH and fT4 - If labs are abnormal,  she will need to return for repeat TFTs in 1 month - if normal >> repeat in 2.5 mo - We again discussed about TSH targets in pregnancy: <2.5 in first trimester <3 in second trimester <3.5 in third trimester - will plan to check her TPO ABs later in her pregnancy, at next visit - I will see her back in 4 mo  Needs refills.  Office Visit on 08/20/2016  Component Date Value Ref Range Status  . Free T4 08/20/2016 0.98  0.60 - 1.60 ng/dL Final   Comment: Specimens from patients who are undergoing biotin therapy and /or ingesting biotin supplements may contain high levels of biotin.  The higher biotin concentration in these specimens interferes with this Free T4 assay.  Specimens that contain high levels  of biotin may cause  false high results for this Free T4 assay.  Please interpret results in light of the total clinical presentation of the patient.    Marland Kitchen TSH 08/20/2016 1.41  0.35 - 4.50 uIU/mL Final   TFTs at goal. Will repeat them in 2.5 mo. Continue LT4 150 mcg.  Philemon Kingdom, MD PhD [facility] Endocrinology

## 2016-08-29 DIAGNOSIS — Z86718 Personal history of other venous thrombosis and embolism: Secondary | ICD-10-CM | POA: Diagnosis not present

## 2016-09-17 ENCOUNTER — Other Ambulatory Visit: Payer: Self-pay

## 2016-09-17 MED ORDER — SYNTHROID 150 MCG PO TABS: 150.0000 ug | ORAL_TABLET | Freq: Every day | ORAL | 2 refills | Status: DC

## 2016-10-16 DIAGNOSIS — Z1329 Encounter for screening for other suspected endocrine disorder: Secondary | ICD-10-CM | POA: Diagnosis not present

## 2016-12-10 DIAGNOSIS — Z23 Encounter for immunization: Secondary | ICD-10-CM | POA: Diagnosis not present

## 2016-12-18 DIAGNOSIS — R7309 Other abnormal glucose: Secondary | ICD-10-CM | POA: Diagnosis not present

## 2016-12-21 ENCOUNTER — Ambulatory Visit: Payer: 59 | Admitting: Internal Medicine

## 2016-12-22 ENCOUNTER — Encounter (HOSPITAL_COMMUNITY): Payer: Self-pay | Admitting: *Deleted

## 2016-12-23 ENCOUNTER — Encounter: Payer: Self-pay | Admitting: Internal Medicine

## 2016-12-23 DIAGNOSIS — E038 Other specified hypothyroidism: Secondary | ICD-10-CM | POA: Diagnosis not present

## 2016-12-24 ENCOUNTER — Telehealth (HOSPITAL_COMMUNITY): Payer: Self-pay | Admitting: *Deleted

## 2016-12-24 ENCOUNTER — Ambulatory Visit (INDEPENDENT_AMBULATORY_CARE_PROVIDER_SITE_OTHER): Payer: 59 | Admitting: Internal Medicine

## 2016-12-24 ENCOUNTER — Encounter: Payer: Self-pay | Admitting: Internal Medicine

## 2016-12-24 VITALS — BP 122/74 | HR 102 | Ht 69.0 in | Wt 194.8 lb

## 2016-12-24 DIAGNOSIS — E063 Autoimmune thyroiditis: Secondary | ICD-10-CM

## 2016-12-24 DIAGNOSIS — E038 Other specified hypothyroidism: Secondary | ICD-10-CM | POA: Diagnosis not present

## 2016-12-24 MED ORDER — SYNTHROID 137 MCG PO TABS: 137.0000 ug | ORAL_TABLET | Freq: Every day | ORAL | 1 refills | Status: DC

## 2016-12-24 NOTE — Progress Notes (Signed)
Patient ID: Abigail Kelley, female   DOB: Aug 30, 1980, 36 y.o.   MRN: [id number]    HPI  Abigail Kelley is a 36 y.o.-year-old female, initially referred by her PCP, Dr. Raoul Pitch, returning for management of Hashimoto's hypothyroidism. She moved here from [place] in 09/2014. Last visit 4 mo ago. ObGyn: Dr. Quincy Simmonds  She had a recent OGTT >> 81-186 (ULN 180)-145-72 >> will repeat at 32 weeks.  She is now pregnant after IVF in [place] >> week 30. Due Date: 03/06/2017. She had 2 miscarriages in 2012. In 11/2012, her IVF was successful >> healthy girl. She has a h/o PCOS. Her husband's sperm count was low.  Pt. has been dx with hypothyroidism in 2010 in [place] >> on Levothyroxine 112 >> 150 on 07/03/2016 with the pregnancy.  Pt is on levothyroxine 150 mcg daily, taken: - in am - fasting - at least 30 min from b'fast - no Ca, Fe, PPIs - + MVI at night - not on Biotin  I reviewed pt's thyroid tests - last yesterday at [facility] office - we obtained results by phone: 12/23/2016: TSH 0.169, fT4 1.36 (0.82-1.77) Lab Results  Component Value Date   TSH 1.41 08/20/2016   TSH 2.31 07/07/2016   TSH 5.44 (H) 05/28/2016   TSH 0.05 (L) 03/12/2016   TSH 3.10 02/07/2016   TSH 7.83 (H) 01/23/2016   TSH 2.93 08/29/2015   TSH 4.09 06/07/2015   TSH 0.13 (L) 04/11/2015   FREET4 0.98 08/20/2016   FREET4 0.98 07/07/2016   FREET4 0.79 05/28/2016   FREET4 1.68 (H) 03/12/2016   FREET4 1.3 02/07/2016   FREET4 1.2 08/29/2015    TPO Abs were elevated >> Hashimoto's thyroiditis: Component     Latest Ref Rng & Units 02/07/2016 03/12/2016  Thyrotropin Receptor Ab     <=16.0 % 9.4   TSI     <140 % baseline <89   Thyroglobulin Ab     <2 IU/mL  <1  Thyroperoxidase Ab SerPl-aCnc     <9 IU/mL  707 (H)   She also had a thyroid ultrasound in 02/07/2016: Slightly diminutive and markedly heterogeneous appearing thyroid gland without discrete nodule or mass.  Pt denies: - feeling nodules in neck - hoarseness -  dysphagia - choking - SOB with lying down  She has no FH of thyroid disorders. No FH of thyroid cancer. No h/o radiation tx to head or neck.  No seaweed or kelp. No recent contrast studies. No herbal supplements. No Biotin use. No recent steroids use.   Pt. also has a history of PCOS, Lumbar herniated disk. C section 2014.  She takes Lovenox for this pregnancy.   She has occasional contractions. Will see ObGyn tomorrow.  ROS: Constitutional: no weight gain/no weight loss, no fatigue, no subjective hyperthermia, no subjective hypothermia Eyes: no blurry vision, no xerophthalmia ENT: no sore throat, + See HPI Cardiovascular: no CP/no SOB/no palpitations/no leg swelling Respiratory: no cough/no SOB/no wheezing Gastrointestinal: no N/no V/no D/no C/+ acid reflux Musculoskeletal: no muscle aches/no joint aches Skin: no rashes, no hair loss Neurological: no tremors/no numbness/no tingling/no dizziness  I reviewed pt's medications, allergies, PMH, social hx, family hx, and changes were documented in the history of present illness. Otherwise, unchanged from my initial visit note.   Past Medical History:  Diagnosis Date  . AMA (advanced maternal age) multigravida 47+   . Hypothyroidism   . Lumbar herniated disc   . 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 Newborn product of in vitro fertilization (IVF)  pregnancy   . PCOS (polycystic ovarian syndrome)   . Thyroid disease    hypothyroid.    Past Surgical History:  Procedure Laterality Date  . CESAREAN SECTION     Social History   Social History  . Marital status: Married    Spouse name: N/A  . Number of children: 1   Occupational History  . Human resources   Social History Main Topics  . Smoking status: Never Smoker  . Smokeless tobacco: Never Used  . Alcohol use No  . Drug use: No  . Sexual activity: Yes    Birth control/ protection: Pill   Social History Narrative   Married. [name] - husband. Barrera.   Moved from [place]  (11/2014)   MBA, Human Resources.    Current Outpatient Medications on File Prior to Visit  Medication Sig Dispense Refill  . enoxaparin (LOVENOX) 40 MG/0.4ML injection 40 mg.    . Multiple Vitamin (MULTIVITAMIN) capsule Take 1 capsule by mouth daily.    Marland Kitchen SYNTHROID 150 MCG tablet Take 1 tablet (150 mcg total) by mouth daily before breakfast. 90 tablet 2   No current facility-administered medications on file prior to visit.    No Known Allergies Family History  Problem Relation Age of Onset  . Hypertension Father   . Alzheimer's disease Paternal Grandmother   . Breast cancer Maternal Aunt   . Breast cancer Paternal Aunt   . Alzheimer's disease Maternal Grandmother   . Lung cancer Maternal Grandfather   . Liver cancer Paternal Grandfather        melenoma metz  . Melanoma Paternal Grandfather    PE: BP 122/74 (BP Location: Left Arm, Patient Position: Sitting, Cuff Size: Normal)   Pulse (!) 102   Ht 5\' 9"  (1.753 m)   Wt 194 lb 12.8 oz (88.4 kg)   LMP 06/04/2016   SpO2 98%   BMI 28.77 kg/m  Wt Readings from Last 3 Encounters:  12/24/16 194 lb 12.8 oz (88.4 kg)  08/20/16 180 lb (81.6 kg)  06/12/16 173 lb (78.5 kg)   Constitutional: pregnant appearing, in NAD Eyes: PERRLA, EOMI, no exophthalmos ENT: moist mucous membranes, no thyromegaly, no cervical lymphadenopathy Cardiovascular: tachycardia, RR, No MRG Respiratory: CTA B Gastrointestinal: abdomen soft, NT, ND, BS+ Musculoskeletal: no deformities, strength intact in all 4 Skin: moist, warm, no rashes Neurological: no tremor with outstretched hands, DTR normal in all 4  ASSESSMENT: 1. Hashimoto's Hypothyroidism  PLAN:  1. Patient with long-standing hypothyroidism, on LT4, now pregnant, in 3rd trimester. Currently on LT4 150 (increased on 07/03/2016).  - latest thyroid labs reviewed with pt >> TSH low on same dose of Synthroid >> we will reduce the dose to 137 mcg daily and recheck TFTs in 1 mo - denies palpitations,  anxiety, wt loss,  - we discussed about taking the thyroid hormone every day, with water, >30 minutes before breakfast, separated by >4 hours from acid reflux medications, calcium, iron, multivitamins. Pt. is taking it correctly. - will check thyroid tests in 1 mo: TSH and fT4 -  TSH targets in pregnancy: <2.5 in first trimester <3 in second and third trimester - I will have her back for labs in 1 mo after she gives birth and then 3 more months for a visit.  Orders Placed This Encounter  Procedures  . T4, free    Standing Status:   Future    Standing Expiration Date:   12/24/2017  . TSH    Standing Status:  Future    Standing Expiration Date:   12/24/2017    Philemon Kingdom, MD PhD [facility] 2 Endocrinology

## 2016-12-24 NOTE — Telephone Encounter (Signed)
Message left

## 2016-12-24 NOTE — Patient Instructions (Addendum)
Please change Levothyroxine to 137 mcg daily.  Take the thyroid hormone every day, with water, at least 30 minutes before breakfast, separated by at least 4 hours from: - acid reflux medications - calcium - iron - multivitamins  Please come back for labs in 1 month and then 1 month after you give birth.  Also, come back for a visit in 3 more month afterwards.

## 2017-01-04 ENCOUNTER — Encounter (HOSPITAL_COMMUNITY)
Admission: RE | Admit: 2017-01-04 | Discharge: 2017-01-04 | Disposition: A | Discharge status: Home / Self Care | Payer: 59 | Source: Ambulatory Visit | Attending: Obstetrics and Gynecology | Admitting: Obstetrics and Gynecology

## 2017-01-04 HISTORY — DX: Hypothyroidism, unspecified: E03.9

## 2017-01-04 HISTORY — DX: Supervision of elderly multigravida, unspecified trimester: O09.529

## 2017-01-04 NOTE — Pre-Procedure Instructions (Signed)
Pt arrived.  In Short Stay room 1.  Dr Azalee Course notified and coming to see pt.

## 2017-01-22 ENCOUNTER — Other Ambulatory Visit (INDEPENDENT_AMBULATORY_CARE_PROVIDER_SITE_OTHER): Payer: 59

## 2017-01-22 DIAGNOSIS — E063 Autoimmune thyroiditis: Secondary | ICD-10-CM | POA: Diagnosis not present

## 2017-01-22 DIAGNOSIS — E038 Other specified hypothyroidism: Secondary | ICD-10-CM

## 2017-01-22 LAB — T4, FREE: FREE T4: 0.72 ng/dL (ref 0.60–1.60)

## 2017-01-22 LAB — TSH: TSH: 0.12 u[IU]/mL — AB (ref 0.35–4.50)

## 2017-01-25 ENCOUNTER — Other Ambulatory Visit: Payer: Self-pay | Admitting: Internal Medicine

## 2017-01-25 ENCOUNTER — Encounter: Payer: Self-pay | Admitting: Internal Medicine

## 2017-01-25 DIAGNOSIS — E063 Autoimmune thyroiditis: Principal | ICD-10-CM

## 2017-01-25 DIAGNOSIS — E038 Other specified hypothyroidism: Secondary | ICD-10-CM

## 2017-01-25 LAB — THYROID PEROXIDASE ANTIBODY: THYROID PEROXIDASE ANTIBODY: 183 [IU]/mL — AB (ref <9)

## 2017-01-25 MED ORDER — SYNTHROID 125 MCG PO TABS: 125.0000 ug | ORAL_TABLET | Freq: Every day | ORAL | 1 refills | Status: DC

## 2017-02-16 DIAGNOSIS — O34211 Maternal care for low transverse scar from previous cesarean delivery: Secondary | ICD-10-CM | POA: Diagnosis not present

## 2017-02-16 DIAGNOSIS — O09519 Supervision of elderly primigravida, unspecified trimester: Secondary | ICD-10-CM | POA: Diagnosis not present

## 2017-02-16 DIAGNOSIS — O09893 Supervision of other high risk pregnancies, third trimester: Secondary | ICD-10-CM | POA: Diagnosis not present

## 2017-02-16 DIAGNOSIS — E079 Disorder of thyroid, unspecified: Secondary | ICD-10-CM | POA: Diagnosis not present

## 2017-02-18 ENCOUNTER — Encounter: Payer: Self-pay | Admitting: Internal Medicine

## 2017-02-18 ENCOUNTER — Other Ambulatory Visit: Payer: Self-pay | Admitting: Internal Medicine

## 2017-02-18 MED ORDER — SYNTHROID 112 MCG PO TABS: 112.0000 ug | ORAL_TABLET | Freq: Every day | ORAL | 3 refills | Status: DC

## 2017-02-25 NOTE — Patient Instructions (Signed)
Deshante Cassell  02/25/2017   Your procedure is scheduled on:  03/01/2017  Enter through the Main Entrance of [facility] up the phone at the desk and dial [phone]  Call this number if you have problems the morning of surgery:[phone]  Remember:   Do not eat food:After Midnight.  Do not drink clear liquids: After Midnight.  Take these medicines the morning of surgery with A SIP OF WATER: synthroid and zantac.  Do not take lovenox for 24 hours prior to CS   Do not wear jewelry, make-up or nail polish.  Do not wear lotions, powders, or perfumes. Do not wear deodorant.  Do not shave 48 hours prior to surgery.  Do not bring valuables to the hospital.  [facility] is not   responsible for any belongings or valuables brought to the hospital.  Contacts, dentures or bridgework may not be worn into surgery.  Leave suitcase in the car. After surgery it may be brought to your room.  For patients admitted to the hospital, checkout time is 11:00 AM the day of              discharge.    N/A   Please read over the following fact sheets that you were given:   Surgical Site Infection Prevention

## 2017-02-26 ENCOUNTER — Encounter (HOSPITAL_COMMUNITY)
Admission: RE | Admit: 2017-02-26 | Discharge: 2017-02-26 | Disposition: A | Discharge status: Home / Self Care | Payer: 59 | Source: Ambulatory Visit | Attending: Obstetrics and Gynecology | Admitting: Obstetrics and Gynecology

## 2017-02-26 LAB — CBC
HEMATOCRIT: 37.8 % (ref 36.0–46.0)
HEMOGLOBIN: 12.6 g/dL (ref 12.0–15.0)
MCH: 28.4 pg (ref 26.0–34.0)
MCHC: 33.3 g/dL (ref 30.0–36.0)
MCV: 85.3 fL (ref 78.0–100.0)
Platelets: 178 10*3/uL (ref 150–400)
RBC: 4.43 MIL/uL (ref 3.87–5.11)
RDW: 15 % (ref 11.5–15.5)
WBC: 10.1 10*3/uL (ref 4.0–10.5)

## 2017-02-26 LAB — TYPE AND SCREEN
ABO/RH(D): A POS
Antibody Screen: NEGATIVE

## 2017-02-26 LAB — ABO/RH: ABO/RH(D): A POS

## 2017-02-27 LAB — RPR: RPR: NONREACTIVE

## 2017-02-28 NOTE — H&P (Signed)
Abigail Kelley is a 37 y.o. female presenting for scheduled repeat elective cesarean section. Pt is dated by IVF transfer date per Dr Darreld Mclean. She is AMA. She has hypothyroidism and has been treated with synthroid in pregnancy but suboptimal control. She was on lovenox for hx of mthfr and recurrent miscarriage until 48hrs till c/s. At 34 weeks was noted to have borderline oligohydramnious - was follewed serially and fluid wnl as per last check. Essential panel neg. Has some anxiety otherwise benign prenatal course.   OB History    Gravida Para Term Preterm AB Living   4 1 1   2 1    SAB TAB Ectopic Multiple Live Births   2     0 1     Past Medical History:  Diagnosis Date  . AMA (advanced maternal age) multigravida 42+   . Hypothyroidism   . Lumbar herniated disc   . MTHFR mutation ([place])   . Newborn product of in vitro fertilization (IVF) pregnancy   . PCOS (polycystic ovarian syndrome)   . Thyroid disease    hypothyroid.    Past Surgical History:  Procedure Laterality Date  . CESAREAN SECTION     Family History: family history includes Alzheimer's disease in her maternal grandmother and paternal grandmother; Breast cancer in her maternal aunt and paternal aunt; Hypertension in her father; Liver cancer in her paternal grandfather; Lung cancer in her maternal grandfather; Melanoma in her paternal grandfather. Social History:  reports that  has never smoked. she has never used smokeless tobacco. She reports that she does not drink alcohol or use drugs.     Maternal Diabetes: No Genetic Screening: Normal Maternal Ultrasounds/Referrals: Normal Fetal Ultrasounds or other Referrals:  None Maternal Substance Abuse:  No Significant Maternal Medications:  None Significant Maternal Lab Results:  Lab values include: Group B Strep negative Other Comments:  None  Review of Systems  Constitutional: Negative for chills, fever, malaise/fatigue and weight loss.  Eyes: Negative for blurred vision.   Respiratory: Negative for shortness of breath.   Cardiovascular: Negative for chest pain.  Gastrointestinal: Positive for abdominal pain. Negative for heartburn, nausea and vomiting.  Genitourinary: Negative for dysuria.  Musculoskeletal: Positive for myalgias. Negative for back pain.  Skin: Negative for itching and rash.  Neurological: Negative for dizziness and headaches.  Endo/Heme/Allergies: Does not bruise/bleed easily.  Psychiatric/Behavioral: Negative for depression, hallucinations, substance abuse and suicidal ideas. The patient is nervous/anxious.    Maternal Medical History:  Reason for admission: Nausea. Scheduled elective cesarean section  Fetal activity: Perceived fetal activity is normal.   Last perceived fetal movement was within the past hour.    Prenatal complications: Oligohydramnios (borderline - resolved) and thrombophilia.   Hx mthfr, hypothyroidism, ivf, ama  Prenatal Complications - Diabetes: none.      Last menstrual period 06/04/2016, unknown if currently breastfeeding. Maternal Exam:  Uterine Assessment: Contraction strength is mild.  Contraction frequency is rare.   Abdomen: Estimated fetal weight is AGA.   Fetal presentation: vertex     Physical Exam  Constitutional: She is oriented to person, place, and time. She appears well-developed and well-nourished.  HENT:  Head: Normocephalic.  Neck: Normal range of motion.  Cardiovascular: Normal rate.  Respiratory: Effort normal.  GI: Soft. There is tenderness.  Genitourinary: Vagina normal and uterus normal.  Neurological: She is alert and oriented to person, place, and time.  Skin: Skin is warm.  Psychiatric: She has a normal mood and affect. Her behavior is normal. Judgment and thought  content normal.    Prenatal labs: ABO, Rh: --/--/A POS, A POS (01/18 1050) Antibody: NEG (01/18 1050) Rubella: Immune (07/03 0000) RPR: Non Reactive (01/18 1050)  HBsAg: Negative (07/03 0000)  HIV:  Non-reactive (07/03 0000)  GBS:     Assessment/Plan: [id number] at 39 2/7wks for repeat c/s at term Restart lovenox 48hrs after c./s x 6 weeks Consent reviewed and all questions answered To OR when ready  Samael Blades W Avik Leoni 02/28/2017, 6:24 AM

## 2017-02-28 NOTE — Anesthesia Preprocedure Evaluation (Signed)
Anesthesia Evaluation  Patient identified by MRN, date of birth, ID band Patient awake    Reviewed: Allergy & Precautions, NPO status , Patient's Chart, lab work & pertinent test results  Airway Mallampati: II  TM Distance: >3 FB Neck ROM: Full    Dental no notable dental hx.    Pulmonary neg pulmonary ROS,    Pulmonary exam normal breath sounds clear to auscultation       Cardiovascular negative cardio ROS Normal cardiovascular exam Rhythm:Regular Rate:Normal     Neuro/Psych negative neurological ROS  negative psych ROS   GI/Hepatic negative GI ROS, Neg liver ROS,   Endo/Other  Hypothyroidism   Renal/GU negative Renal ROS     Musculoskeletal negative musculoskeletal ROS (+)   Abdominal   Peds  Hematology negative hematology ROS (+)   Anesthesia Other Findings   Reproductive/Obstetrics (+) Pregnancy                             Anesthesia Physical Anesthesia Plan  ASA: II  Anesthesia Plan: Spinal   Post-op Pain Management:    Induction:   PONV Risk Score and Plan: 4 or greater and Ondansetron, Dexamethasone, Scopolamine patch - Pre-op and Treatment may vary due to age or medical condition  Airway Management Planned:   Additional Equipment:   Intra-op Plan:   Post-operative Plan:   Informed Consent: I have reviewed the patients History and Physical, chart, labs and discussed the procedure including the risks, benefits and alternatives for the proposed anesthesia with the patient or authorized representative who has indicated his/her understanding and acceptance.   Dental advisory given  Plan Discussed with: CRNA  Anesthesia Plan Comments:         Anesthesia Quick Evaluation

## 2017-03-01 ENCOUNTER — Inpatient Hospital Stay (HOSPITAL_COMMUNITY): Payer: 59 | Admitting: Anesthesiology

## 2017-03-01 ENCOUNTER — Encounter (HOSPITAL_COMMUNITY)
Admission: RE | Disposition: A | Discharge status: Home / Self Care | Payer: Self-pay | Source: Ambulatory Visit | Attending: Obstetrics and Gynecology

## 2017-03-01 ENCOUNTER — Other Ambulatory Visit: Payer: Self-pay

## 2017-03-01 ENCOUNTER — Inpatient Hospital Stay (HOSPITAL_COMMUNITY)
Admission: RE | Admit: 2017-03-01 | Discharge: 2017-03-03 | DRG: 788 | Disposition: A | Discharge status: Home / Self Care | Payer: 59 | Source: Ambulatory Visit | Attending: Obstetrics and Gynecology | Admitting: Obstetrics and Gynecology

## 2017-03-01 ENCOUNTER — Encounter (HOSPITAL_COMMUNITY): Payer: Self-pay

## 2017-03-01 DIAGNOSIS — Z8614 Personal history of Methicillin resistant Staphylococcus aureus infection: Secondary | ICD-10-CM | POA: Diagnosis not present

## 2017-03-01 DIAGNOSIS — O326XX0 Maternal care for compound presentation, not applicable or unspecified: Secondary | ICD-10-CM | POA: Diagnosis present

## 2017-03-01 DIAGNOSIS — O99284 Endocrine, nutritional and metabolic diseases complicating childbirth: Secondary | ICD-10-CM | POA: Diagnosis present

## 2017-03-01 DIAGNOSIS — Z98891 History of uterine scar from previous surgery: Secondary | ICD-10-CM

## 2017-03-01 DIAGNOSIS — E039 Hypothyroidism, unspecified: Secondary | ICD-10-CM | POA: Diagnosis not present

## 2017-03-01 DIAGNOSIS — Z3A39 39 weeks gestation of pregnancy: Secondary | ICD-10-CM | POA: Diagnosis not present

## 2017-03-01 DIAGNOSIS — O34211 Maternal care for low transverse scar from previous cesarean delivery: Principal | ICD-10-CM | POA: Diagnosis present

## 2017-03-01 LAB — TYPE AND SCREEN
ABO/RH(D): A POS
ANTIBODY SCREEN: NEGATIVE

## 2017-03-01 SURGERY — Surgical Case
Anesthesia: Spinal

## 2017-03-01 MED ORDER — ONDANSETRON HCL 4 MG/2ML IJ SOLN
INTRAMUSCULAR | Status: DC | PRN
  Administered 2017-03-01: 4 mg via INTRAVENOUS

## 2017-03-01 MED ORDER — NALOXONE HCL 0.4 MG/ML IJ SOLN: 0.4000 mg | INTRAMUSCULAR | Status: DC | PRN

## 2017-03-01 MED ORDER — HYDROMORPHONE HCL 1 MG/ML IJ SOLN
INTRAMUSCULAR | Status: AC
  Filled 2017-03-01: qty 1

## 2017-03-01 MED ORDER — WITCH HAZEL-GLYCERIN EX PADS: 1.0000 "application " | MEDICATED_PAD | CUTANEOUS | Status: DC | PRN

## 2017-03-01 MED ORDER — HYDROMORPHONE HCL 1 MG/ML IJ SOLN: 0.2500 mg | INTRAMUSCULAR | Status: DC | PRN

## 2017-03-01 MED ORDER — NALBUPHINE HCL 10 MG/ML IJ SOLN: 5.0000 mg | Freq: Once | INTRAMUSCULAR | Status: DC | PRN

## 2017-03-01 MED ORDER — OXYTOCIN 10 UNIT/ML IJ SOLN
INTRAVENOUS | Status: DC | PRN
  Administered 2017-03-01: 40 [IU] via INTRAVENOUS

## 2017-03-01 MED ORDER — OXYCODONE HCL 5 MG/5ML PO SOLN: 5.0000 mg | Freq: Once | ORAL | Status: DC | PRN

## 2017-03-01 MED ORDER — BUPIVACAINE IN DEXTROSE 0.75-8.25 % IT SOLN
INTRATHECAL | Status: DC | PRN
  Administered 2017-03-01: 12 mg via INTRATHECAL

## 2017-03-01 MED ORDER — ONDANSETRON HCL 4 MG/2ML IJ SOLN
4.0000 mg | Freq: Three times a day (TID) | INTRAMUSCULAR | Status: DC | PRN
Start: 2017-03-01 — End: 2017-03-03

## 2017-03-01 MED ORDER — COCONUT OIL OIL
1.0000 "application " | TOPICAL_OIL | Status: DC | PRN
  Filled 2017-03-01: qty 120

## 2017-03-01 MED ORDER — OXYTOCIN 10 UNIT/ML IJ SOLN
INTRAMUSCULAR | Status: AC
  Filled 2017-03-01: qty 4

## 2017-03-01 MED ORDER — OXYCODONE HCL 5 MG PO TABS: 5.0000 mg | ORAL_TABLET | Freq: Once | ORAL | Status: DC | PRN

## 2017-03-01 MED ORDER — DEXAMETHASONE SODIUM PHOSPHATE 10 MG/ML IJ SOLN
INTRAMUSCULAR | Status: DC | PRN
  Administered 2017-03-01: 10 mg via INTRAVENOUS

## 2017-03-01 MED ORDER — SIMETHICONE 80 MG PO CHEW
80.0000 mg | CHEWABLE_TABLET | ORAL | Status: DC
  Administered 2017-03-02 (×2): 80 mg via ORAL
  Filled 2017-03-01 (×2): qty 1

## 2017-03-01 MED ORDER — DEXAMETHASONE SODIUM PHOSPHATE 10 MG/ML IJ SOLN
INTRAMUSCULAR | Status: AC
  Filled 2017-03-01: qty 1

## 2017-03-01 MED ORDER — KETOROLAC TROMETHAMINE 30 MG/ML IJ SOLN: 30.0000 mg | Freq: Once | INTRAMUSCULAR | Status: DC | PRN

## 2017-03-01 MED ORDER — NALOXONE HCL 4 MG/10ML IJ SOLN
1.0000 ug/kg/h | INTRAMUSCULAR | Status: DC | PRN
  Filled 2017-03-01: qty 5

## 2017-03-01 MED ORDER — SCOPOLAMINE 1 MG/3DAYS TD PT72
MEDICATED_PATCH | TRANSDERMAL | Status: DC | PRN
  Administered 2017-03-01: 1 via TRANSDERMAL

## 2017-03-01 MED ORDER — SODIUM CHLORIDE 0.9 % IR SOLN
Status: DC | PRN
  Administered 2017-03-01: 1

## 2017-03-01 MED ORDER — OXYCODONE HCL 5 MG PO TABS
5.0000 mg | ORAL_TABLET | ORAL | Status: DC | PRN
  Administered 2017-03-01 – 2017-03-03 (×2): 5 mg via ORAL
  Filled 2017-03-01 (×2): qty 1

## 2017-03-01 MED ORDER — CEFAZOLIN SODIUM-DEXTROSE 2-4 GM/100ML-% IV SOLN
2.0000 g | INTRAVENOUS | Status: AC
  Administered 2017-03-01: 2 g via INTRAVENOUS

## 2017-03-01 MED ORDER — FENTANYL CITRATE (PF) 100 MCG/2ML IJ SOLN
INTRAMUSCULAR | Status: AC
  Filled 2017-03-01: qty 2

## 2017-03-01 MED ORDER — TETANUS-DIPHTH-ACELL PERTUSSIS 5-2.5-18.5 LF-MCG/0.5 IM SUSP: 0.5000 mL | Freq: Once | INTRAMUSCULAR | Status: DC

## 2017-03-01 MED ORDER — PRENATAL MULTIVITAMIN CH
1.0000 | ORAL_TABLET | Freq: Every day | ORAL | Status: DC
  Administered 2017-03-02 – 2017-03-03 (×2): 1 via ORAL
  Filled 2017-03-01 (×2): qty 1

## 2017-03-01 MED ORDER — DIPHENHYDRAMINE HCL 25 MG PO CAPS
25.0000 mg | ORAL_CAPSULE | ORAL | Status: DC | PRN
  Filled 2017-03-01: qty 1

## 2017-03-01 MED ORDER — SCOPOLAMINE 1 MG/3DAYS TD PT72
MEDICATED_PATCH | TRANSDERMAL | Status: AC
  Filled 2017-03-01: qty 1

## 2017-03-01 MED ORDER — PROMETHAZINE HCL 25 MG/ML IJ SOLN: 6.2500 mg | INTRAMUSCULAR | Status: DC | PRN

## 2017-03-01 MED ORDER — LACTATED RINGERS IV SOLN
INTRAVENOUS | Status: DC
  Administered 2017-03-01 (×3): via INTRAVENOUS

## 2017-03-01 MED ORDER — MORPHINE SULFATE (PF) 0.5 MG/ML IJ SOLN
INTRAMUSCULAR | Status: AC
Start: 2017-03-01 — End: ?
  Filled 2017-03-01: qty 10

## 2017-03-01 MED ORDER — OXYCODONE HCL 5 MG PO TABS: 10.0000 mg | ORAL_TABLET | ORAL | Status: DC | PRN

## 2017-03-01 MED ORDER — DIBUCAINE 1 % RE OINT: 1.0000 "application " | TOPICAL_OINTMENT | RECTAL | Status: DC | PRN

## 2017-03-01 MED ORDER — SIMETHICONE 80 MG PO CHEW
80.0000 mg | CHEWABLE_TABLET | Freq: Three times a day (TID) | ORAL | Status: DC
  Administered 2017-03-01 – 2017-03-03 (×6): 80 mg via ORAL
  Filled 2017-03-01 (×6): qty 1

## 2017-03-01 MED ORDER — HYDROMORPHONE 1 MG/ML IV SOLN: INTRAVENOUS | Status: DC

## 2017-03-01 MED ORDER — NALBUPHINE HCL 10 MG/ML IJ SOLN: 5.0000 mg | INTRAMUSCULAR | Status: DC | PRN

## 2017-03-01 MED ORDER — MEPERIDINE HCL 25 MG/ML IJ SOLN: 6.2500 mg | INTRAMUSCULAR | Status: DC | PRN

## 2017-03-01 MED ORDER — ONDANSETRON HCL 4 MG/2ML IJ SOLN
INTRAMUSCULAR | Status: AC
  Filled 2017-03-01: qty 2

## 2017-03-01 MED ORDER — PHENYLEPHRINE 8 MG IN D5W 100 ML (0.08MG/ML) PREMIX OPTIME
INJECTION | INTRAVENOUS | Status: DC | PRN
  Administered 2017-03-01: 60 ug/min via INTRAVENOUS

## 2017-03-01 MED ORDER — SODIUM CHLORIDE 0.9% FLUSH: 9.0000 mL | INTRAVENOUS | Status: DC | PRN

## 2017-03-01 MED ORDER — ACETAMINOPHEN 325 MG PO TABS
650.0000 mg | ORAL_TABLET | ORAL | Status: DC | PRN
  Administered 2017-03-01 – 2017-03-02 (×2): 650 mg via ORAL
  Filled 2017-03-01 (×2): qty 2

## 2017-03-01 MED ORDER — IBUPROFEN 600 MG PO TABS
600.0000 mg | ORAL_TABLET | Freq: Four times a day (QID) | ORAL | Status: DC
  Administered 2017-03-01 – 2017-03-03 (×8): 600 mg via ORAL
  Filled 2017-03-01 (×8): qty 1

## 2017-03-01 MED ORDER — HYDROMORPHONE HCL 1 MG/ML IJ SOLN
INTRAMUSCULAR | Status: DC | PRN
  Administered 2017-03-01 (×4): 0.5 mg via INTRAVENOUS

## 2017-03-01 MED ORDER — SODIUM CHLORIDE 0.9% FLUSH: 3.0000 mL | INTRAVENOUS | Status: DC | PRN

## 2017-03-01 MED ORDER — SCOPOLAMINE 1 MG/3DAYS TD PT72: 1.0000 | MEDICATED_PATCH | Freq: Once | TRANSDERMAL | Status: DC

## 2017-03-01 MED ORDER — LEVOTHYROXINE SODIUM 112 MCG PO TABS
112.0000 ug | ORAL_TABLET | Freq: Every day | ORAL | Status: DC
  Filled 2017-03-01 (×4): qty 1

## 2017-03-01 MED ORDER — LACTATED RINGERS IV SOLN
INTRAVENOUS | Status: DC
  Administered 2017-03-01: 16:00:00 via INTRAVENOUS

## 2017-03-01 MED ORDER — SENNOSIDES-DOCUSATE SODIUM 8.6-50 MG PO TABS
2.0000 | ORAL_TABLET | ORAL | Status: DC
  Administered 2017-03-02 (×2): 2 via ORAL
  Filled 2017-03-01 (×2): qty 2

## 2017-03-01 MED ORDER — SIMETHICONE 80 MG PO CHEW: 80.0000 mg | CHEWABLE_TABLET | ORAL | Status: DC | PRN

## 2017-03-01 MED ORDER — DIPHENHYDRAMINE HCL 50 MG/ML IJ SOLN: 12.5000 mg | INTRAMUSCULAR | Status: DC | PRN

## 2017-03-01 MED ORDER — PHENYLEPHRINE 8 MG IN D5W 100 ML (0.08MG/ML) PREMIX OPTIME
INJECTION | INTRAVENOUS | Status: AC
  Filled 2017-03-01: qty 100

## 2017-03-01 MED ORDER — MENTHOL 3 MG MT LOZG: 1.0000 | LOZENGE | OROMUCOSAL | Status: DC | PRN

## 2017-03-01 MED ORDER — FENTANYL CITRATE (PF) 100 MCG/2ML IJ SOLN
INTRAMUSCULAR | Status: DC | PRN
  Administered 2017-03-01 (×2): 50 ug via INTRAVENOUS

## 2017-03-01 MED ORDER — OXYTOCIN 40 UNITS IN LACTATED RINGERS INFUSION - SIMPLE MED: 2.5000 [IU]/h | INTRAVENOUS | Status: AC

## 2017-03-01 MED ORDER — ZOLPIDEM TARTRATE 5 MG PO TABS
5.0000 mg | ORAL_TABLET | Freq: Every evening | ORAL | Status: DC | PRN
Start: 2017-03-01 — End: 2017-03-03

## 2017-03-01 SURGICAL SUPPLY — 39 items
BENZOIN TINCTURE PRP APPL 2/3 (GAUZE/BANDAGES/DRESSINGS) ×3 IMPLANT
CHLORAPREP W/TINT 26ML (MISCELLANEOUS) ×3 IMPLANT
CLAMP CORD UMBIL (MISCELLANEOUS) IMPLANT
CLOSURE WOUND 1/2 X4 (GAUZE/BANDAGES/DRESSINGS) ×1
CLOTH BEACON ORANGE TIMEOUT ST (SAFETY) ×3 IMPLANT
DRAPE C SECTION CLR SCREEN (DRAPES) ×3 IMPLANT
DRSG OPSITE POSTOP 4X10 (GAUZE/BANDAGES/DRESSINGS) ×3 IMPLANT
ELECT REM PT RETURN 9FT ADLT (ELECTROSURGICAL) ×3
ELECTRODE REM PT RTRN 9FT ADLT (ELECTROSURGICAL) ×1 IMPLANT
EXTRACTOR VACUUM KIWI (MISCELLANEOUS) IMPLANT
GAUZE SPONGE 4X4 12PLY STRL LF (GAUZE/BANDAGES/DRESSINGS) ×6 IMPLANT
GLOVE BIO SURGEON STRL SZ 6.5 (GLOVE) ×2 IMPLANT
GLOVE BIO SURGEONS STRL SZ 6.5 (GLOVE) ×1
GLOVE BIOGEL PI IND STRL 7.0 (GLOVE) ×2 IMPLANT
GLOVE BIOGEL PI INDICATOR 7.0 (GLOVE) ×4
GOWN STRL REUS W/TWL LRG LVL3 (GOWN DISPOSABLE) ×6 IMPLANT
KIT ABG SYR 3ML LUER SLIP (SYRINGE) IMPLANT
NEEDLE HYPO 25X5/8 SAFETYGLIDE (NEEDLE) IMPLANT
NS IRRIG 1000ML POUR BTL (IV SOLUTION) ×3 IMPLANT
PACK C SECTION WH (CUSTOM PROCEDURE TRAY) ×3 IMPLANT
PAD ABD DERMACEA PRESS 5X9 (GAUZE/BANDAGES/DRESSINGS) ×3 IMPLANT
PAD OB MATERNITY 4.3X12.25 (PERSONAL CARE ITEMS) ×3 IMPLANT
RETAINER VISCERAL (MISCELLANEOUS) ×3 IMPLANT
RETRACTOR WND ALEXIS 25 LRG (MISCELLANEOUS) ×1 IMPLANT
RTRCTR C-SECT PINK 25CM LRG (MISCELLANEOUS) IMPLANT
RTRCTR WOUND ALEXIS 25CM LRG (MISCELLANEOUS) ×3
STRIP CLOSURE SKIN 1/2X4 (GAUZE/BANDAGES/DRESSINGS) ×2 IMPLANT
SUT CHROMIC 1 CTX 36 (SUTURE) ×6 IMPLANT
SUT PLAIN 0 NONE (SUTURE) IMPLANT
SUT PLAIN 2 0 XLH (SUTURE) ×3 IMPLANT
SUT VIC AB 0 CT1 27 (SUTURE) ×4
SUT VIC AB 0 CT1 27XBRD ANBCTR (SUTURE) ×2 IMPLANT
SUT VIC AB 2-0 CT1 27 (SUTURE) ×4
SUT VIC AB 2-0 CT1 TAPERPNT 27 (SUTURE) ×2 IMPLANT
SUT VIC AB 3-0 CT1 27 (SUTURE)
SUT VIC AB 3-0 CT1 TAPERPNT 27 (SUTURE) IMPLANT
SUT VIC AB 4-0 KS 27 (SUTURE) ×3 IMPLANT
TOWEL OR 17X24 6PK STRL BLUE (TOWEL DISPOSABLE) ×3 IMPLANT
TRAY FOLEY BAG SILVER LF 14FR (SET/KITS/TRAYS/PACK) ×3 IMPLANT

## 2017-03-01 NOTE — Anesthesia Procedure Notes (Signed)
Spinal  Patient location during procedure: OR Staffing Anesthesiologist: Nolon Nations, MD Performed: anesthesiologist  Preanesthetic Checklist Completed: patient identified, site marked, surgical consent, pre-op evaluation, timeout performed, IV checked, risks and benefits discussed and monitors and equipment checked Spinal Block Patient position: sitting Prep: site prepped and draped and DuraPrep Patient monitoring: heart rate, continuous pulse ox and blood pressure Approach: midline Location: L3-4 Injection technique: single-shot Needle Needle type: Sprotte  Needle gauge: 24 G Needle length: 9 cm Additional Notes Expiration date of kit checked and confirmed. Patient tolerated procedure well, without complications.

## 2017-03-01 NOTE — Interval H&P Note (Signed)
History and Physical Interval Note:  03/01/2017 9:24 AM  Abigail Kelley  has presented today for surgery, with the diagnosis of repeat C-Section   The various methods of treatment have been discussed with the patient and family. After consideration of risks, benefits and other options for treatment, the patient has consented to  Procedure(s) with comments: REPEAT CESAREAN SECTION (N/A) - [facility] as a surgical intervention .  The patient's history has been reviewed, patient examined, no change in status, stable for surgery.  I have reviewed the patient's chart and labs.  Questions were answered to the patient's satisfaction.     Isaiah Serge

## 2017-03-01 NOTE — Progress Notes (Signed)
In chart review RN saw order for Dilaudid PCA.  RN discussed PCA with patient and educated her about what it is for and what it does.  Patient was also educated that she must wear the CO2/repiratory monitor while using.  Patient stated that she did not have much pain right now and wanted to try her oral pain medication before using the PCA.  Patient stated that if she changed her mind later she would let the RN know.  Patient was given tylenol for a pain score of 1.

## 2017-03-01 NOTE — Op Note (Signed)
Operative Note    Preoperative Diagnosis 1. IUP at 39 2/7wks  2. Prior cesarean section   Postoperative Diagnosis: Same   Procedure: Repeat low transverse cesarean section with double layered closure   Surgeon: Mickle Mallory DO Assist: Alfonso Patten  Anesthesia: Spinal  Fluids: LR 2572ml EBL: 427ml UOP: 243ml   Findings: viable female infant in compound left arm vertex presentation. Vacuum applied x 4 with pop off x 2. Grossly normal uterus, tubes and ovaries   Specimen: placenta to L/D   Procedure Note Consent verified pre-op. All questions answered   Patient was taken to the operating room where spinal anesthesia was administered. She was prepped and draped in the normal sterile fashion and placed in the dorsal supine position with a leftward tilt. An appropriate time out was performed. A Pfannenstiel skin incision was then made through the previous incision with the scalpel and carried through to the underlying layer of fascia by sharp dissection and Bovie cautery. The fascia was nicked in the midline and the incision was extended laterally with Mayo scissors. Scarring noted. The superior and inferior aspects of the incision were grasped Coker clamps and dissected off the underlying rectus muscles.Rectus muscles were separated in the midline  and the peritoneal cavity entered bluntly. Mild scarring encountered again. The peritoneal incision was then extended both superiorly and inferiorly with careful attention to avoid both bowel and bladder. The Alexis self-retaining wound retractor was then placed and the lower uterine segment exposed. The bladder flap was developed with Metzenbaum scissors and pushed away from the lower uterine segment. The thin lower uterine segment was then incised in a transverse fashion and the cavity itself entered bluntly. The incision was extended bluntly. Kiwi vacuum was applied to assist delivery of the infant's head as it was wedged into pelvis and  unable to be delivered with fundal pressure alone. Vacuum was applied x 4 total ( infant hair affected suction) and two pop offs occurred. Head was finally delivered; compound left arm presentation noted. Vigorous spontaneous cry was noted during delivery.  The remainder of the infant delivered and the nose and mouth bulb suctioned. The cord was clamped and cut after a minute delay. The infant was handed off to the waiting pediatricians. Cord blood was collected per pt request and banking instructions.  The placenta was then spontaneously expressed from the uterus and the uterus cleared of all clots and debris with moist lap sponge. The umbilical cord was clamped close to placenta and also collected for banking. Placenta sent to L/D. The uterine incision was then repaired in 2 layers the first layer was a running locked layer 1-0 chromic and the second an imbricating layer of the same suture. The tubes and ovaries were inspected and the gutters cleared of all clots and debris. The uterine incision was inspected and found to be hemostatic. All instruments and sponges were then removed from the abdomen. The peritoneum and rectus muscles were then reapproximated in a running fashion with sutures of 2-0 Vicryl.  The fascia was then closed with 0 Vicryl in a running fashion. Subcutaneous tissue was undermined and bleeding sites cauterized. The skin was closed with a subcuticular stitch of 4-0 Vicryl on a Keith needle and then reinforced with benzoin and Steri-Strips. At the conclusion of the procedure all instruments and sponge counts were correct. Patient was taken to the recovery room in good condition with her baby accompanying her skin to skin.  Apgars 8,9

## 2017-03-01 NOTE — Lactation Note (Signed)
This note was copied from a baby's chart. Lactation Consultation Note  Patient Name: Abigail Kelley [id number] Date: 03/01/2017 Reason for consult: Early term 37-38.6wks;Maternal endocrine disorder Type of Endocrine Disorder?: PCOS(mom reports multiple breast changes/ breast fed 1st baby with supplementing ) Baby is 98 hours old , and has been to the breast x 4 ( 20 -30 mins )  Per mom it has been awhile since the baby fed, LC offered to place baby STS and if the baby wakes up to latch,   mom receptive. LC checked diaper, dry , placed baby STS, after breast massage, hand express, pre-pumped to prime the milk ducts and to make the nipple / areola more elastic, baby awake for short interval and mouthed tissue and fell back to sleep. [place] placed baby on her chest STS and light blanket on his back. Mom and baby comfortable.  Grandmother at bedside if mom needs assistance. Baby was gaggy and LC reviewed use of the bulb syringe.  LC reviewed feeding goals of 8 x's in 24 hours, sleep wake stages of newborns, importance of STS enhancing let down.  LC started the shells , pre- pumping to prevent sore nipples and to enhance a deep latch.  LC instructed mom on the use hand pump and shells.  Mother informed of post-discharge support and given phone number to the lactation department, including services for phone call assistance; out-patient appointments; and breastfeeding support group. List of other breastfeeding resources in the community given in the handout. Encouraged mother to call for problems or concerns related to breastfeeding.  Mom expressed appreciation for care and [place] assistance.   Maternal Data Has patient been taught Hand Expression?: Yes(tiny drops noted/ mom repeated demo ) Does the patient have breastfeeding experience prior to this delivery?: Yes  Feeding Feeding Type: Breast Fed Length of feed: 30 min  LATCH Score Latch: Too sleepy or reluctant, no latch achieved, no sucking  elicited.  Audible Swallowing: None  Type of Nipple: Everted at rest and after stimulation(semi compressible - see LC note )  Comfort (Breast/Nipple): Soft / non-tender  Hold (Positioning): Assistance needed to correctly position infant at breast and maintain latch.  LATCH Score: 5  Interventions Interventions: Breast feeding basics reviewed;Assisted with latch;Skin to skin;Breast massage;Hand express;Pre-pump if needed;Reverse pressure;Breast compression;Adjust position;Support pillows;Position options;Shells;Hand pump  Lactation Tools Discussed/Used Tools: Shells;Pump;Flanges Flange Size: 24(good fit - see LC note ) Shell Type: Inverted Breast pump type: Manual Pump Review: Setup, frequency, and cleaning Initiated by:: MAI  Date initiated:: 03/01/17   Consult Status Consult Status: Follow-up Date: 03/02/17 Follow-up type: In-patient    [place] 03/01/2017, 9:36 PM

## 2017-03-01 NOTE — Transfer of Care (Signed)
Immediate Anesthesia Transfer of Care Note  Patient: Abigail Kelley  Procedure(s) Performed: REPEAT CESAREAN SECTION (N/A )  Patient Location: PACU  Anesthesia Type:Spinal  Level of Consciousness: sedated  Airway & Oxygen Therapy: Patient Spontanous Breathing  Post-op Assessment: Report given to RN  Post vital signs: Reviewed and stable  Last Vitals:  Vitals:   03/01/17 0754  BP: (!) 141/92  Pulse: 89  Resp: 18  Temp: 36.9 C    Last Pain:  Vitals:   03/01/17 0754  TempSrc: Oral      Patients Stated Pain Goal: 4 (81/59/47 0761)  Complications: No apparent anesthesia complications

## 2017-03-02 LAB — CBC
HCT: 32.1 % — ABNORMAL LOW (ref 36.0–46.0)
Hemoglobin: 10.5 g/dL — ABNORMAL LOW (ref 12.0–15.0)
MCH: 28.2 pg (ref 26.0–34.0)
MCHC: 32.7 g/dL (ref 30.0–36.0)
MCV: 86.1 fL (ref 78.0–100.0)
Platelets: 171 10*3/uL (ref 150–400)
RBC: 3.73 MIL/uL — AB (ref 3.87–5.11)
RDW: 14.7 % (ref 11.5–15.5)
WBC: 15.8 10*3/uL — AB (ref 4.0–10.5)

## 2017-03-02 NOTE — Progress Notes (Signed)
Subjective: Postpartum Day #1: Cesarean Delivery Patient reports incisional pain, tolerating PO and no problems voiding.    Objective: Vital signs in last 24 hours: Temp:  [97.6 F (36.4 C)-98.7 F (37.1 C)] 98.1 F (36.7 C) (01/22 0500) Pulse Rate:  [78-96] 86 (01/22 0500) Resp:  [15-24] 16 (01/22 0500) BP: (126-149)/(69-97) 132/80 (01/22 0500) SpO2:  [95 %-99 %] 95 % (01/22 0045)  Physical Exam:  General: alert Lochia: appropriate Uterine Fundus: firm Incision: large dressing C/D/I   Recent Labs    03/02/17 0600  HGB 10.5*  HCT 32.1*    Assessment/Plan: Status post Cesarean section. Doing well postoperatively.  Continue current care, ambulate.  Abigail Kelley 03/02/2017, 8:26 AM

## 2017-03-02 NOTE — Progress Notes (Signed)
CSW received consult for hx of Anxiety.  CSW completed thorough chart review and notes no documentation of an Anxiety diagnosis.  CSW contacted RN to ask about score on Edinburgh Postnatal Depression Screen, which she states is 5 today.  CSW is screening out referral and asks to be contacted if concerns arise or by MOB's request.

## 2017-03-02 NOTE — Anesthesia Postprocedure Evaluation (Signed)
Anesthesia Post Note  Patient: Abigail Kelley  Procedure(s) Performed: REPEAT CESAREAN SECTION (N/A )     Patient location during evaluation: Mother Baby Anesthesia Type: Spinal Level of consciousness: oriented and awake and alert Pain management: pain level controlled Vital Signs Assessment: post-procedure vital signs reviewed and stable Respiratory status: spontaneous breathing and respiratory function stable Cardiovascular status: stable Postop Assessment: no headache, no backache, no apparent nausea or vomiting, adequate PO intake, spinal receding and patient able to bend at knees Anesthetic complications: no    Last Vitals:  Vitals:   03/02/17 0045 03/02/17 0500  BP: 126/69 132/80  Pulse: 78 86  Resp: 16 16  Temp: 36.7 C 36.7 C  SpO2: 95%     Last Pain:  Vitals:   03/02/17 0500  TempSrc: Oral  PainSc:    Pain Goal: Patients Stated Pain Goal: 1 (03/01/17 2314)               Jabier Mutton

## 2017-03-03 MED ORDER — OXYCODONE HCL 5 MG PO TABS: 5.0000 mg | ORAL_TABLET | Freq: Four times a day (QID) | ORAL | 0 refills | Status: DC | PRN

## 2017-03-03 MED ORDER — PRENATAL MULTIVITAMIN CH: 1.0000 | ORAL_TABLET | Freq: Every day | ORAL | 3 refills | Status: DC

## 2017-03-03 MED ORDER — IBUPROFEN 600 MG PO TABS: 600.0000 mg | ORAL_TABLET | Freq: Four times a day (QID) | ORAL | 1 refills | Status: DC | PRN

## 2017-03-03 NOTE — Lactation Note (Signed)
This note was copied from a baby's chart. Lactation Consultation Note  Patient Name: Boy Basha Krygier WYOVZ'[id number] Date: 03/03/2017 Reason for consult: Follow-up assessment   P2, Baby 60 hours old. Mother concerned because she feels her milk supply has not come in.  Reassured mother. Reviewed hand expression with mother and good transitional milk flow was expressed.  Mother has been wearing shells, has coconut oil.  Nipples feel better - noted positional stripes healing. Attempted latching but baby sleepy.  Reviewed various positions, depth and answered mother's questions. Mom encouraged to feed baby 8-12 times/24 hours and with feeding cues.  Mom made aware of O/P services, breastfeeding support groups, community resources, and our phone # for post-discharge questions.  Discussed hand expressing and spoon feeding if needed. Reviewed engorgement care and monitoring voids/stools.    Maternal Data    Feeding Feeding Type: Breast Fed Length of feed: 15 min  LATCH Score Latch: Grasps breast easily, tongue down, lips flanged, rhythmical sucking.  Audible Swallowing: Spontaneous and intermittent  Type of Nipple: Everted at rest and after stimulation  Comfort (Breast/Nipple): Soft / non-tender  Hold (Positioning): Assistance needed to correctly position infant at breast and maintain latch.  LATCH Score: 9  Interventions    Lactation Tools Discussed/Used     Consult Status Consult Status: Complete    Carlye Grippe 03/03/2017, 12:43 PM

## 2017-03-03 NOTE — Progress Notes (Addendum)
Subjective: Postpartum Day 2: Cesarean Delivery Patient reports incisional pain, tolerating PO and no problems voiding.    Objective: Vital signs in last 24 hours: Temp:  [98.3 F (36.8 C)-98.9 F (37.2 C)] 98.3 F (36.8 C) (01/23 1470) Pulse Rate:  [79-100] 79 (01/23 0611) Resp:  [16-18] 16 (01/23 0611) BP: (118-123)/(76-86) 118/76 (01/23 0611) SpO2:  [98 %-99 %] 98 % (01/23 9295)  Physical Exam:  General: alert and no distress Lochia: appropriate Uterine Fundus: firm Incision: healing well DVT Evaluation: No evidence of DVT seen on physical exam.  Recent Labs    03/02/17 0600  HGB 10.5*  HCT 32.1*    Assessment/Plan: Status post Cesarean section. Doing well postoperatively.  Continue current care.  Pt wants early d/c if OK with peds - d/c with motrin, percocet and PNV.  F/u 2 weeks  Abigail Kelley 03/03/2017, 8:11 AM

## 2017-03-03 NOTE — Discharge Summary (Signed)
OB Discharge Summary     Patient Name: Abigail Kelley DOB: 1980/10/22 MRN: [id number]  Date of admission: 03/01/2017 Delivering MD: Carlynn Purl [facility]   Date of discharge: 03/03/2017  Admitting diagnosis: repeat C-Section  Intrauterine pregnancy: [redacted]w[redacted]d     Secondary diagnosis:  Active Problems:   Status post repeat low transverse cesarean section   Postpartum care following cesarean delivery  Additional problems: none     Discharge diagnosis: Term Pregnancy Delivered                                                                                                Post partum procedures:none  Augmentation: N/A  Complications: None  Hospital course:  Sceduled C/S   37 y.o. yo [id number] at [redacted]w[redacted]d was admitted to the hospital 03/01/2017 for scheduled cesarean section with the following indication:Elective Repeat.  Membrane Rupture Time/Date: 10:14 AM ,03/01/2017   Patient delivered a Viable infant.03/01/2017  Details of operation can be found in separate operative note.  Pateint had an uncomplicated postpartum course.  She is ambulating, tolerating a regular diet, passing flatus, and urinating well. Patient is discharged home in stable condition on  03/03/17         Physical exam  Vitals:   03/02/17 0831 03/02/17 0930 03/02/17 1818 03/03/17 0611  BP:  122/81 123/86 118/76  Pulse:  100 93 79  Resp:  18 18 16   Temp:  98.4 F (36.9 C) 98.9 F (37.2 C) 98.3 F (36.8 C)  TempSrc:  Oral Oral Oral  SpO2: 99% 98%  98%  Weight:      Height:       General: alert and no distress Lochia: appropriate Uterine Fundus: firm Incision: Healing well with no significant drainage DVT Evaluation: No evidence of DVT seen on physical exam. Labs: Lab Results  Component Value Date   WBC 15.8 (H) 03/02/2017   HGB 10.5 (L) 03/02/2017   HCT 32.1 (L) 03/02/2017   MCV 86.1 03/02/2017   PLT 171 03/02/2017   CMP Latest Ref Rng & Units 04/22/2016  Glucose 65 - 99 mg/dL 68  BUN 7 - 25 mg/dL 9   Creatinine 0.50 - 1.10 mg/dL 0.79  Sodium 135 - 146 mmol/L 138  Potassium 3.5 - 5.3 mmol/L 4.1  Chloride 98 - 110 mmol/L 102  CO2 20 - 31 mmol/L 26  Calcium 8.6 - 10.2 mg/dL 9.5  Total Protein 6.1 - 8.1 g/dL 7.6  Total Bilirubin 0.2 - 1.2 mg/dL 0.4  Alkaline Phos 33 - 115 U/L 43  AST 10 - 30 U/L 19  ALT 6 - 29 U/L 19    Discharge instruction: per After Visit Summary and "Baby and Me Booklet".  After visit meds:  Allergies as of 03/03/2017   No Known Allergies     Medication List    TAKE these medications   acetaminophen 500 MG tablet Commonly known as:  TYLENOL Take 500 mg by mouth every 8 (eight) hours as needed for mild pain or headache.   enoxaparin 40 MG/0.4ML injection Commonly known as:  LOVENOX Inject 40 mg into the skin daily.  ibuprofen 600 MG tablet Commonly known as:  ADVIL,MOTRIN Take 1 tablet (600 mg total) by mouth every 6 (six) hours as needed for moderate pain.   oxyCODONE 5 MG immediate release tablet Commonly known as:  Oxy IR/ROXICODONE Take 1 tablet (5 mg total) by mouth every 6 (six) hours as needed for moderate pain (pain scale 4-7). 1-2 tablets po q 6 hr prn severe pain   prenatal multivitamin Tabs tablet Take 1 tablet by mouth daily at 12 noon.   ranitidine 75 MG tablet Commonly known as:  ZANTAC Take 75 mg by mouth daily as needed for heartburn.   SYNTHROID 112 MCG tablet Generic drug:  levothyroxine Take 1 tablet (112 mcg total) by mouth daily before breakfast.       Diet: routine diet  Activity: Advance as tolerated. Pelvic rest for 6 weeks.   Outpatient follow up:2 weeks Follow up Appt: Future Appointments  Date Time Provider [place]  04/26/2017  1:30 PM Nunzio Cobbs, MD [place] None   Follow up Visit:No Follow-up on file.  Postpartum contraception: Undecided  Newborn Data: Live born female  Birth Weight: 8 lb 10.6 oz (3930 g) APGAR: 8, 9  Newborn Delivery   Birth date/time:  03/01/2017  10:19:00 Delivery type:  C-Section, Vacuum Assisted C-section categorization:  Repeat     Baby Feeding: Breast Disposition:home with mother   03/03/2017 Janyth Contes, MD

## 2017-04-07 DIAGNOSIS — Z1329 Encounter for screening for other suspected endocrine disorder: Secondary | ICD-10-CM | POA: Diagnosis not present

## 2017-04-07 DIAGNOSIS — Z1389 Encounter for screening for other disorder: Secondary | ICD-10-CM | POA: Diagnosis not present

## 2017-04-10 ENCOUNTER — Encounter: Payer: Self-pay | Admitting: Internal Medicine

## 2017-04-12 ENCOUNTER — Other Ambulatory Visit: Payer: Self-pay | Admitting: Internal Medicine

## 2017-04-12 MED ORDER — SYNTHROID 100 MCG PO TABS: 100.0000 ug | ORAL_TABLET | Freq: Every day | ORAL | 5 refills | Status: DC

## 2017-04-26 ENCOUNTER — Ambulatory Visit: Payer: 59 | Admitting: Obstetrics and Gynecology

## 2017-05-05 DIAGNOSIS — Z3482 Encounter for supervision of other normal pregnancy, second trimester: Secondary | ICD-10-CM | POA: Diagnosis not present

## 2017-05-05 DIAGNOSIS — Z3483 Encounter for supervision of other normal pregnancy, third trimester: Secondary | ICD-10-CM | POA: Diagnosis not present

## 2017-05-14 DIAGNOSIS — E039 Hypothyroidism, unspecified: Secondary | ICD-10-CM | POA: Diagnosis not present

## 2017-05-14 DIAGNOSIS — Z124 Encounter for screening for malignant neoplasm of cervix: Secondary | ICD-10-CM | POA: Diagnosis not present

## 2017-05-14 DIAGNOSIS — Z01419 Encounter for gynecological examination (general) (routine) without abnormal findings: Secondary | ICD-10-CM | POA: Diagnosis not present

## 2017-05-19 ENCOUNTER — Encounter: Payer: Self-pay | Admitting: Internal Medicine

## 2017-05-21 ENCOUNTER — Telehealth: Payer: Self-pay | Admitting: Internal Medicine

## 2017-05-21 NOTE — Telephone Encounter (Signed)
Patient stated she sent a mychart message back and has not heard from doctor. She went to [facility] and they tested tsh and this was still low.  She would like to know if she needs to stay on the same dosage   SYNTHROID 100 MCG tablet  She is about to run out of her prescription and would like to know before she gets this refilled.   [id number] - [place], [place] - [address] AT [address]

## 2017-05-24 ENCOUNTER — Other Ambulatory Visit: Payer: Self-pay | Admitting: Internal Medicine

## 2017-05-24 ENCOUNTER — Encounter: Payer: Self-pay | Admitting: Internal Medicine

## 2017-05-24 DIAGNOSIS — E038 Other specified hypothyroidism: Secondary | ICD-10-CM

## 2017-05-24 DIAGNOSIS — E063 Autoimmune thyroiditis: Principal | ICD-10-CM

## 2017-05-24 MED ORDER — SYNTHROID 88 MCG PO TABS: 88.0000 ug | ORAL_TABLET | Freq: Every day | ORAL | 5 refills | Status: DC

## 2017-05-24 NOTE — Telephone Encounter (Signed)
Cassandra, this message was not forwarded to me before.  I see that it went in Courtney's in basket.  I am a little worried that this did not come to me in several days... I sent a prescription for a lower dose of Synthroid to her pharmacy since her TSH was suppressed and I would want her to come back for labs in 1.5 months.

## 2017-05-25 NOTE — Telephone Encounter (Signed)
Spoke to patient. Apologized for late response. Not sure why this message was not forwarded to Dr. Darnell Level earlier. Patient verbalized understanding and confirmed aware Synthroid sent to her pharmacy and to schedule lab appt.

## 2017-07-14 ENCOUNTER — Encounter: Payer: Self-pay | Admitting: Family Medicine

## 2017-07-14 ENCOUNTER — Ambulatory Visit (HOSPITAL_BASED_OUTPATIENT_CLINIC_OR_DEPARTMENT_OTHER)
Admission: RE | Admit: 2017-07-14 | Discharge: 2017-07-14 | Disposition: A | Discharge status: Home / Self Care | Payer: 59 | Source: Ambulatory Visit | Attending: Family Medicine | Admitting: Family Medicine

## 2017-07-14 ENCOUNTER — Ambulatory Visit (INDEPENDENT_AMBULATORY_CARE_PROVIDER_SITE_OTHER): Payer: 59 | Admitting: Family Medicine

## 2017-07-14 VITALS — BP 124/85 | HR 86 | Temp 98.9°F | Resp 20 | Ht 69.0 in | Wt 184.0 lb

## 2017-07-14 DIAGNOSIS — M7062 Trochanteric bursitis, left hip: Secondary | ICD-10-CM

## 2017-07-14 DIAGNOSIS — M545 Low back pain: Secondary | ICD-10-CM | POA: Diagnosis not present

## 2017-07-14 DIAGNOSIS — M5441 Lumbago with sciatica, right side: Secondary | ICD-10-CM | POA: Diagnosis not present

## 2017-07-14 DIAGNOSIS — M5416 Radiculopathy, lumbar region: Secondary | ICD-10-CM | POA: Diagnosis not present

## 2017-07-14 DIAGNOSIS — M4317 Spondylolisthesis, lumbosacral region: Secondary | ICD-10-CM | POA: Insufficient documentation

## 2017-07-14 MED ORDER — TRAMADOL HCL 50 MG PO TABS: 50.0000 mg | ORAL_TABLET | Freq: Three times a day (TID) | ORAL | 0 refills | Status: DC | PRN

## 2017-07-14 MED ORDER — PREDNISONE 20 MG PO TABS: ORAL_TABLET | ORAL | 0 refills | Status: DC

## 2017-07-14 MED ORDER — CYCLOBENZAPRINE HCL 5 MG PO TABS: 5.0000 mg | ORAL_TABLET | Freq: Two times a day (BID) | ORAL | 1 refills | Status: DC | PRN

## 2017-07-14 NOTE — Progress Notes (Signed)
Abigail Kelley , July 28, 1980, 37 y.o., female MRN: [id number] Patient Care Team    Relationship Specialty Notifications Start End  Ma Hillock, DO PCP - General Family Medicine  06/19/15   Nunzio Cobbs, MD Consulting Physician Obstetrics and Gynecology  06/19/15     Chief Complaint  Patient presents with  . Back Pain    Subjective: Pt presents for an acute OV with complaints of low back pain that has recurred. She has has intermittent chronic back pain for at least 10 years. She was seen 09/10/2015 and provided with prednisone taper, tramadol and flexeril in which her pan responded well. Over the last 4 weeks she has experienced increased pain in her lower right and left side of back, with raidation to her left foot and right knee. Symptoms include right lumbar, buttock and radiation of pain to knee. She has a history lumbar back pain, with "disk disease". She had old xrays from 2013 in [place] her prior appt. No paper copies or report.  Appeared to have mild spondylolisthesis then. She has recently gave birth to her son in January and has a toddler. She feels caring for them and picking them up makes her pain worse. She also endorses more left lateral hip pain today, that is worse with pressure or laying on her hip.  She is breast feeding, but she is also supplementing with formula   No Known Allergies Social History   Tobacco Use  . Smoking status: Never Smoker  . Smokeless tobacco: Never Used  Substance Use Topics  . Alcohol use: No    Alcohol/week: 0.0 oz   Past Medical History:  Diagnosis Date  . AMA (advanced maternal age) multigravida 51+   . Hypothyroidism   . Lumbar herniated disc   . MTHFR mutation ([place])   . Newborn product of in vitro fertilization (IVF) pregnancy   . PCOS (polycystic ovarian syndrome)   . Thyroid disease    hypothyroid.    Past Surgical History:  Procedure Laterality Date  . CESAREAN SECTION    . CESAREAN SECTION N/A 03/01/2017     Procedure: REPEAT CESAREAN SECTION;  Surgeon: Sherlyn Hay, DO;  Location: [facility];  Service: Obstetrics;  Laterality: N/A;  Heather K RNFA   Family History  Problem Relation Age of Onset  . Hypertension Father   . Alzheimer's disease Paternal Grandmother   . Breast cancer Maternal Aunt   . Breast cancer Paternal Aunt   . Alzheimer's disease Maternal Grandmother   . Lung cancer Maternal Grandfather   . Liver cancer Paternal Grandfather        melenoma metz  . Melanoma Paternal Grandfather    Allergies as of 07/14/2017   No Known Allergies     Medication List        Accurate as of 07/14/17  1:59 PM. Always use your most recent med list.          CAMILA 0.35 MG tablet Generic drug:  norethindrone Take 1 tablet by mouth daily.   prenatal multivitamin Tabs tablet Take 1 tablet by mouth daily at 12 noon.   SYNTHROID 88 MCG tablet Generic drug:  levothyroxine Take 1 tablet (88 mcg total) by mouth daily before breakfast.       No results found for this or any previous visit (from the past 24 hour(s)). No results found.   ROS: Negative, with the exception of above mentioned in HPI   Objective:  BP 124/85 (BP  Location: Right Arm, Patient Position: Sitting, Cuff Size: Normal)   Pulse 86   Temp 98.9 F (37.2 C)   Resp 20   Ht 5\' 9"  (1.753 m)   Wt 184 lb (83.5 kg)   SpO2 97%   Breastfeeding? Yes   BMI 27.17 kg/m  Body mass index is 27.17 kg/m.  Gen: Afebrile. No acute distress. Nontoxic in appearance, well developed, well nourished, [place] female. Very pleasant.  HENT: AT. Learned. MMM.  Eyes:Pupils Equal Round Reactive to light, Extraocular movements intact,  Conjunctiva without redness, discharge or icterus. MSK: no erythema of lumbar spine. No bruising. TTP right SI, left trochanteric hip TTP. Bilateral IT band TTP. Full ROM lumbar spine with pain w/ Left SB and flexion with radiation down right leg. + SLR right, + FABRE right, + FABRE LEFT (w/  right SI pain).   Skin: No rashes, purpura or petechiae.  Neuro: Normal gait. PERLA. EOMi. Alert. Oriented. Muscle strength 5/5 bilateral LE. DTR equal bilateral.   Psych: Normal affect, dress and demeanor. Normal speech. Normal thought content and judgment..   Assessment/Plan: Abigail Kelley is a 37 y.o. female present for acute OV for  Patient is a currently breast-feeding mother Avoid BF with steroids and tramadol. Can use flexeril sparingly, low dose. She understands and will avoid BF during use. She is already supplementing with formula.  Lumbar radiculopathy - has daily mild pain, with worsening pain last 4 weeks. Prior h/o of flares that responded well to short course of steroid and flexeril.  - tramadol short course for severe pain only prescribed.  - prednisone taper and flexeril prescribed.  - Ambulatory referral to Sports Medicine--> OMT and further evaluation if needed. If does not respond to management or worsens consider MRI - DG Lumbar Spine Complete; Future - f/u 4 weeks if not improved or est with SM. Otherwise they can take over management if desired.   Trochanteric bursitis of left hip - Hopefully will respond to steroid. Discussed other OTC tx and when resistant to OTC tx, injections can be completed. Referred to [facility] for lumbar radiculopathy and she can discuss this as well if desiring injection. - Ambulatory referral to Sports Medicine - traMADol (ULTRAM) 50 MG tablet; Take 1 tablet (50 mg total) by mouth every 8 (eight) hours as needed.  Dispense: 21 tablet; Refill: 0 - cyclobenzaprine (FLEXERIL) 5 MG tablet; Take 1 tablet (5 mg total) by mouth 2 (two) times daily as needed for muscle spasms.  Dispense: 60 tablet; Refill: 1 - f/u PRN   electronically signed by:  Howard Pouch, DO  [place]

## 2017-07-14 NOTE — Patient Instructions (Addendum)
Tramadol and prednisone avoid breast feeding with use.  Flexeril is ok at low doses with breastfeeding.   Please have xray completed : [address] I referred you to Sports med for OMT.   F/U 3-4 weeks here if not seen at [facility], otherwise they can take over management if you desire.   Chronic Back Pain When back pain lasts longer than 3 months, it is called chronic back pain.The cause of your back pain may not be known. Some common causes include:  Wear and tear (degenerative disease) of the bones, ligaments, or disks in your back.  Inflammation and stiffness in your back (arthritis).  People who have chronic back pain often go through certain periods in which the pain is more intense (flare-ups). Many people can learn to manage the pain with home care. Follow these instructions at home: Pay attention to any changes in your symptoms. Take these actions to help with your pain: Activity  Avoid bending and activities that make the problem worse.  Do not sit or stand in one place for long periods of time.  Take brief periods of rest throughout the day. This will reduce your pain. Resting in a lying or standing position is usually better than sitting to rest.  When you are resting for longer periods, mix in some mild activity or stretching between periods of rest. This will help to prevent stiffness and pain.  Get regular exercise. Ask your health care provider what activities are safe for you.  Do not lift anything that is heavier than 10 lb (4.5 kg). Always use proper lifting technique, which includes: ? Bending your knees. ? Keeping the load close to your body. ? Avoiding twisting. Managing pain  If directed, apply ice to the painful area. Your health care provider may recommend applying ice during the first 24-48 hours after a flare-up begins. ? Put ice in a plastic bag. ? Place a towel between your skin and the bag. ? Leave the ice on for 20 minutes, 2-3  times per day.  After icing, apply heat to the affected area as often as told by your health care provider. Use the heat source that your health care provider recommends, such as a moist heat pack or a heating pad. ? Place a towel between your skin and the heat source. ? Leave the heat on for 20-30 minutes. ? Remove the heat if your skin turns bright red. This is especially important if you are unable to feel pain, heat, or cold. You may have a greater risk of getting burned.  Try soaking in a warm tub.  Take over-the-counter and prescription medicines only as told by your health care provider.  Keep all follow-up visits as told by your health care provider. This is important. Contact a health care provider if:  You have pain that is not relieved with rest or medicine. Get help right away if:  You have weakness or numbness in one or both of your legs or feet.  You have trouble controlling your bladder or your bowels.  You have nausea or vomiting.  You have pain in your abdomen.  You have shortness of breath or you faint. This information is not intended to replace advice given to you by your health care provider. Make sure you discuss any questions you have with your health care provider. Document Released: 03/05/2004 Document Revised: 06/06/2015 Document Reviewed: 07/16/2014 Elsevier Interactive Patient Education  2018 [place]  Bursitis is when the fluid-filled sac (bursa) that covers and protects a joint is swollen (inflamed). Bursitis is most common near joints, especially the knees, elbows, hips, and shoulders. Follow these instructions at home:  Take medicines only as told by your doctor.  If you were prescribed an antibiotic medicine, finish it all even if you start to feel better.  Rest the affected area as told by your doctor. ? Keep the area raised up. ? Avoid doing things that make the pain worse.  Apply ice to the injured area: ? Place ice in a  plastic bag. ? Place a towel between your skin and the bag. ? Leave the ice on for 20 minutes, 2-3 times a day.  Use splints, braces, pads, or walking aids as told by your doctor.  Keep all follow-up visits as told by your doctor. This is important. Contact a doctor if:  You have more pain with home care.  You have a fever.  You have chills. This information is not intended to replace advice given to you by your health care provider. Make sure you discuss any questions you have with your health care provider. Document Released: 07/16/2009 Document Revised: 07/04/2015 Document Reviewed: 04/17/2013 Elsevier Interactive Patient Education  2018 [employer].

## 2017-07-15 ENCOUNTER — Telehealth: Payer: Self-pay | Admitting: Family Medicine

## 2017-07-15 NOTE — Telephone Encounter (Signed)
Spoke with patient reviewed xray results ,instructions and information. Patient verbalized understanding. 

## 2017-07-15 NOTE — Telephone Encounter (Signed)
Please inform patient the following information: Her lumbar xray showed evidence of mild forward slip of her last lumbar vertebrae on her sacrum (triangle shaped bone at end of spinal cord).  First step is treat current symptoms. Second is PT/OMT--> which she was referred to sports med---> they will call to set up. If symptoms progress--> MRI and possible referral to neurology/surgery, which will be done by SM if needed after their treatment. Hopefully symptoms will resolve with short course of meds and SM. - urgent symptoms which would require immediate appt or ED is bladder or bowel symptoms (either inability to go or loss of control) or difficulty picking up foot/walk. Which she should not have to worry about at this time since it was not that severe appearing on xray image.

## 2017-07-15 NOTE — Telephone Encounter (Signed)
Tried to contact patient voice mail not set up unable to leave message. 

## 2017-07-16 DIAGNOSIS — D485 Neoplasm of uncertain behavior of skin: Secondary | ICD-10-CM | POA: Diagnosis not present

## 2017-07-16 DIAGNOSIS — D225 Melanocytic nevi of trunk: Secondary | ICD-10-CM | POA: Diagnosis not present

## 2017-07-16 DIAGNOSIS — D2261 Melanocytic nevi of right upper limb, including shoulder: Secondary | ICD-10-CM | POA: Diagnosis not present

## 2017-07-16 DIAGNOSIS — D2239 Melanocytic nevi of other parts of face: Secondary | ICD-10-CM | POA: Diagnosis not present

## 2017-07-16 DIAGNOSIS — D224 Melanocytic nevi of scalp and neck: Secondary | ICD-10-CM | POA: Diagnosis not present

## 2017-07-19 ENCOUNTER — Encounter: Payer: Self-pay | Admitting: Family Medicine

## 2017-07-25 ENCOUNTER — Other Ambulatory Visit: Payer: Self-pay | Admitting: Internal Medicine

## 2017-07-26 NOTE — Telephone Encounter (Signed)
88 mcg is correct. The 137 dose was from her pregnancy.

## 2017-07-26 NOTE — Telephone Encounter (Signed)
I see dose change for 144mcg, please advise on current dose please.

## 2017-07-29 ENCOUNTER — Encounter: Payer: Self-pay | Admitting: Internal Medicine

## 2017-08-07 NOTE — Progress Notes (Signed)
[facility] Abigail Kelley, [place] [address] Phone: [phone] Subjective:    I'm seeing this patient by the request  of:  Kuneff, Renee A, DO   CC:  Low back pain   [id number]  Abigail Kelley is a 37 y.o. female coming in with complaint of low back pain. 5 years ago was diagnosed with a herniated disc. New born baby this past January. Has some xrays. No numbness and tingling noted. Right sided pain. Pain radiates to her foot on the right side. States her lateral leg muscles are very tense. Can't stand for longer than a hour.   Onset- Chronic Location- Lower back (right side) Duration- All day pain Character- Sharp in the back, dull, achy, sore  Aggravating factors- Sitting, standing, walking, laying down Therapies tried-nothing but over-the-counter medications Severity-6 out of 10  Patient did have x-rays of the lower back done by primary care provider.  These were independently visualized by me.  Showed patient did have a spondylolisthesis at L5-S1 but otherwise fairly unremarkable    Past Medical History:  Diagnosis Date  . AMA (advanced maternal age) multigravida 72+   . Hypothyroidism   . Lumbar herniated disc   . MTHFR mutation ([place])   . Newborn product of in vitro fertilization (IVF) pregnancy   . PCOS (polycystic ovarian syndrome)   . Thyroid disease    hypothyroid.    Past Surgical History:  Procedure Laterality Date  . CESAREAN SECTION    . CESAREAN SECTION N/A 03/01/2017   Procedure: REPEAT CESAREAN SECTION;  Surgeon: Sherlyn Hay, DO;  Location: [facility];  Service: Obstetrics;  Laterality: N/A;  [name] RNFA   Social History   Socioeconomic History  . Marital status: Married    Spouse name: Not on file  . Number of children: Not on file  . Years of education: Not on file  . Highest education level: Not on file  Occupational History  . Not on file  Social Needs  . Financial resource  strain: Not on file  . Food insecurity:    Worry: Not on file    Inability: Not on file  . Transportation needs:    Medical: Not on file    Non-medical: Not on file  Tobacco Use  . Smoking status: Never Smoker  . Smokeless tobacco: Never Used  Substance and Sexual Activity  . Alcohol use: No    Alcohol/week: 0.0 oz  . Drug use: No  . Sexual activity: Yes    Birth control/protection: Pill    Comment: Yasmin  Lifestyle  . Physical activity:    Days per week: Not on file    Minutes per session: Not on file  . Stress: Not on file  Relationships  . Social connections:    Talks on phone: Not on file    Gets together: Not on file    Attends religious service: Not on file    Active member of club or organization: Not on file    Attends meetings of clubs or organizations: Not on file    Relationship status: Not on file  Other Topics Concern  . Not on file  Social History Narrative   Married. [name] is her husband. 1 Child Verdis Frederickson).   Moved from [place] (11/2014)   MBA, Human Resources.    Drinks caffeine   Wears her seatbelt, wears her bike helmet   Smoke detector in the home.    Feels safe in her  relationships.    No Known Allergies Family History  Problem Relation Age of Onset  . Hypertension Father   . Alzheimer's disease Paternal Grandmother   . Breast cancer Maternal Aunt   . Breast cancer Paternal Aunt   . Alzheimer's disease Maternal Grandmother   . Lung cancer Maternal Grandfather   . Liver cancer Paternal Grandfather        melenoma metz  . Melanoma Paternal Grandfather      Past medical history, social, surgical and family history all reviewed in electronic medical record.  No pertanent information unless stated regarding to the chief complaint.   Review of Systems:Review of systems updated and as accurate as of 08/09/17  No headache, visual changes, nausea, vomiting, diarrhea, constipation, dizziness, abdominal pain, skin rash, fevers, chills, night sweats,  weight loss, swollen lymph nodes, body aches, joint swelling, muscle aches, chest pain, shortness of breath, mood changes.   Objective  Blood pressure (!) 136/94, pulse 90, height 5\' 9"  (1.753 m), weight 188 lb (85.3 kg), SpO2 98 %, currently breastfeeding. Systems examined below as of 08/09/17   General: No apparent distress alert and oriented x3 mood and affect normal, dressed appropriately.  HEENT: Pupils equal, extraocular movements intact  Respiratory: Patient's speak in full sentences and does not appear short of breath  Cardiovascular: No lower extremity edema, non tender, no erythema  Skin: Warm dry intact with no signs of infection or rash on extremities or on axial skeleton.  Abdomen: Soft nontender  Neuro: Cranial nerves II through XII are intact, neurovascularly intact in all extremities with 2+ DTRs and 2+ pulses.  Lymph: No lymphadenopathy of posterior or anterior cervical chain or axillae bilaterally.  Gait normal with good balance and coordination.  MSK:  Non tender with full range of motion and good stability and symmetric strength and tone of shoulders, elbows, wrist, hip, knee and ankles bilaterally.   Back Exam:  Inspection: Significant loss of lordosis Motion: Flexion 45 deg, Extension 25 deg, Side Bending to 45 deg bilaterally,  Rotation to 45 deg bilaterally  SLR laying: Right XSLR laying: Negative  Palpable tenderness: Tenderness to palpation the paraspinal musculature lumbar spine of the right sacroiliac joint. FABER: Positive right. Sensory change: Gross sensation intact to all lumbar and sacral dermatomes.  Reflexes: 2+ at both patellar tendons, 2+ at achilles tendons, Babinski's downgoing.  Strength at foot  Plantar-flexion: 5/5 Dorsi-flexion: 5/5 Eversion: 5/5 Inversion: 5/5  Leg strength  Quad: 5/5 Hamstring: 5/5 Hip flexor: 5/5 Hip abductors: 4/5  Gait unremarkable.  Osteopathic findings T9 extended rotated and side bent left L2 flexed rotated and  side bent left Sacrum right on right     Impression and Recommendations:     This case required medical decision making of moderate complexity.      Note: This dictation was prepared with Dragon dictation along with smaller phrase technology. Any transcriptional errors that result from this process are unintentional.

## 2017-08-09 ENCOUNTER — Other Ambulatory Visit (INDEPENDENT_AMBULATORY_CARE_PROVIDER_SITE_OTHER): Payer: 59

## 2017-08-09 ENCOUNTER — Encounter: Payer: Self-pay | Admitting: Family Medicine

## 2017-08-09 ENCOUNTER — Encounter: Payer: Self-pay | Admitting: Internal Medicine

## 2017-08-09 ENCOUNTER — Ambulatory Visit (INDEPENDENT_AMBULATORY_CARE_PROVIDER_SITE_OTHER): Payer: 59 | Admitting: Family Medicine

## 2017-08-09 DIAGNOSIS — M4317 Spondylolisthesis, lumbosacral region: Secondary | ICD-10-CM | POA: Diagnosis not present

## 2017-08-09 DIAGNOSIS — E038 Other specified hypothyroidism: Secondary | ICD-10-CM | POA: Diagnosis not present

## 2017-08-09 DIAGNOSIS — E063 Autoimmune thyroiditis: Secondary | ICD-10-CM | POA: Diagnosis not present

## 2017-08-09 DIAGNOSIS — M999 Biomechanical lesion, unspecified: Secondary | ICD-10-CM

## 2017-08-09 LAB — TSH: TSH: 8.19 u[IU]/mL — ABNORMAL HIGH (ref 0.35–4.50)

## 2017-08-09 LAB — T4, FREE: FREE T4: 0.73 ng/dL (ref 0.60–1.60)

## 2017-08-09 MED ORDER — PREDNISONE 50 MG PO TABS: 50.0000 mg | ORAL_TABLET | Freq: Every day | ORAL | 0 refills | Status: DC

## 2017-08-09 MED ORDER — GABAPENTIN 100 MG PO CAPS: 200.0000 mg | ORAL_CAPSULE | Freq: Every day | ORAL | 3 refills | Status: DC

## 2017-08-09 NOTE — Assessment & Plan Note (Signed)
Decision today to treat with OMT was based on Physical Exam  After verbal consent patient was treated with HVLA, ME, FPR techniques in  thoracic, lumbar and sacral areas  Patient tolerated the procedure well with improvement in symptoms  Patient given exercises, stretches and lifestyle modifications  See medications in patient instructions if given  Patient will follow up in 4 weeks 

## 2017-08-09 NOTE — Patient Instructions (Addendum)
Good to see you  Ice 20 minutes 2 times daily. Usually after activity and before bed. Prednisone daily for 5 days Gabapentin 200mg  at night can take 1 pill if make you sleepy  Vitamin D 2000 IU daily  Turmeric 500mg  daily  Exercises 3 times a week.  See me again in 2-4 weeks

## 2017-08-09 NOTE — Assessment & Plan Note (Signed)
Patient does have mild sloping noted.  Likely contributing to some nerve impingement from time to time.  Prednisone, gabapentin given.  Patient has discontinued breast-feeding.  We discussed icing regimen, home exercise, topical anti-inflammatories.  Responded well to manipulation.  Follow-up again in 4 weeks

## 2017-08-10 MED ORDER — LEVOTHYROXINE SODIUM 100 MCG PO TABS: 100.0000 ug | ORAL_TABLET | Freq: Every day | ORAL | 3 refills | Status: DC

## 2017-08-11 ENCOUNTER — Other Ambulatory Visit: Payer: Self-pay | Admitting: Internal Medicine

## 2017-08-11 DIAGNOSIS — E663 Overweight: Secondary | ICD-10-CM

## 2017-08-13 ENCOUNTER — Encounter: Payer: Self-pay | Admitting: Family Medicine

## 2017-08-27 ENCOUNTER — Encounter: Payer: Self-pay | Admitting: Family Medicine

## 2017-08-27 ENCOUNTER — Ambulatory Visit (INDEPENDENT_AMBULATORY_CARE_PROVIDER_SITE_OTHER): Payer: 59 | Admitting: Family Medicine

## 2017-08-27 VITALS — BP 119/87 | HR 81 | Temp 98.9°F | Resp 20 | Ht 69.0 in | Wt 187.4 lb

## 2017-08-27 DIAGNOSIS — R638 Other symptoms and signs concerning food and fluid intake: Secondary | ICD-10-CM

## 2017-08-27 DIAGNOSIS — E663 Overweight: Secondary | ICD-10-CM

## 2017-08-27 MED ORDER — NALTREXONE-BUPROPION HCL ER 8-90 MG PO TB12: ORAL_TABLET | ORAL | 2 refills | Status: DC

## 2017-08-27 MED ORDER — LORCASERIN HCL ER 20 MG PO TB24: 1.0000 | ORAL_TABLET | Freq: Every day | ORAL | 2 refills | Status: DC

## 2017-08-27 NOTE — Addendum Note (Signed)
Addended by: Howard Pouch A on: 08/27/2017 05:09 PM   Modules accepted: Orders

## 2017-08-27 NOTE — Progress Notes (Signed)
Abigail Kelley , 10-Feb-1981, 37 y.o., female MRN: [id number] Patient Care Team    Relationship Specialty Notifications Start End  Ma Hillock, DO PCP - General Family Medicine  06/19/15   Nunzio Cobbs, MD Consulting Physician Obstetrics and Gynecology  06/19/15     Chief Complaint  Patient presents with  . weight management     Subjective: Pt presents for an OV with complaints of weight gain. Her son was born 74  Months ago na dshe feels she is no longer losing the weight. She admits she was heavier this pre-pregnancy this time. She initially was seeing weight loss with breast feeding. However when she returned to work and stopped breastfeeding, the weight loss stopped. She is not meal prepping. She does not watch her diet or formally exercise. She has two small children, works full time and  Married. She reports when she was younger she could restrict her food much easier. Now when she gets hungry she feels irritable and becomes short with her family.  Depression screen [name] 2/9 07/14/2017 06/19/2015  Decreased Interest 0 0  Down, Depressed, Hopeless 0 0  PHQ - 2 Score 0 0    No Known Allergies Social History   Tobacco Use  . Smoking status: Never Smoker  . Smokeless tobacco: Never Used  Substance Use Topics  . Alcohol use: No    Alcohol/week: 0.0 oz   Past Medical History:  Diagnosis Date  . AMA (advanced maternal age) multigravida 29+   . Hypothyroidism   . Lumbar herniated disc   . MTHFR mutation ([place])   . Newborn product of in vitro fertilization (IVF) pregnancy   . PCOS (polycystic ovarian syndrome)   . Thyroid disease    hypothyroid.    Past Surgical History:  Procedure Laterality Date  . CESAREAN SECTION    . CESAREAN SECTION N/A 03/01/2017   Procedure: REPEAT CESAREAN SECTION;  Surgeon: Sherlyn Hay, DO;  Location: [facility];  Service: Obstetrics;  Laterality: N/A;  Heather K RNFA   Family History  Problem Relation Age of  Onset  . Hypertension Father   . Alzheimer's disease Paternal Grandmother   . Breast cancer Maternal Aunt   . Breast cancer Paternal Aunt   . Alzheimer's disease Maternal Grandmother   . Lung cancer Maternal Grandfather   . Liver cancer Paternal Grandfather        melenoma metz  . Melanoma Paternal Grandfather    Allergies as of 08/27/2017   No Known Allergies     Medication List        Accurate as of 08/27/17 10:07 AM. Always use your most recent med list.          cyclobenzaprine 5 MG tablet Commonly known as:  FLEXERIL Take 1 tablet (5 mg total) by mouth 2 (two) times daily as needed for muscle spasms.   gabapentin 100 MG capsule Commonly known as:  NEURONTIN Take 2 capsules (200 mg total) by mouth at bedtime.   levothyroxine 100 MCG tablet Commonly known as:  SYNTHROID, LEVOTHROID Take 1 tablet (100 mcg total) by mouth daily.   prenatal multivitamin Tabs tablet Take 1 tablet by mouth daily at 12 noon.       All past medical history, surgical history, allergies, family history, immunizations andmedications were updated in the EMR today and reviewed under the history and medication portions of their EMR.     ROS: Negative, with the exception of above mentioned in HPI  Objective:  BP 119/87 (BP Location: Right Arm, Patient Position: Sitting, Cuff Size: Large)   Pulse 81   Temp 98.9 F (37.2 C)   Resp 20   Ht 5\' 9"  (1.753 m)   Wt 187 lb 6 oz (85 kg)   SpO2 98%   BMI 27.67 kg/m  Body mass index is 27.67 kg/m. Gen: Afebrile. No acute distress. Nontoxic in appearance, well developed, well nourished.  HENT: AT. [place].  MMM Eyes:Pupils Equal Round Reactive to light, Extraocular movements intact,  Conjunctiva without redness, discharge or icterus. CV: RRR  Chest: CTAB, no wheeze or crackles.  Abd: Soft. overweight. NTND. BS present.  Neuro:  Normal gait. PERLA. EOMi. Alert. Oriented x3  Psych: Normal affect, dress and demeanor. Normal speech. Normal thought  content and judgment.  No exam data present No results found. No results found for this or any previous visit (from the past 24 hour(s)).  Assessment/Plan: [name] is a 37 y.o. female present for OV for  Difficulty maintaining weight/Overweight (BMI 25.0-29.9) Download my fitness pal app and track calories/water/exercise My calorie calculator (google) > calculate calories per day--> do this weekly. Would not recommend go below 1100 calories a day.  Increase exercise to 150 minutes of week of heart rate in the cardio zone (160-180).  Increase water to at least 100 ounces a day, more when exercising.  Avoid white pasta, rice, potato, sugar, flour--> get your cards from vegetables and fresh fruits.  Try to pick carbs from low glycemic index sources (google has resources) Weight watchers is also a good source.  Start belviq med, followup in 3 months. Sooner if needed.    Reviewed expectations re: course of current medical issues.  Discussed self-management of symptoms.  Outlined signs and symptoms indicating need for more acute intervention.  Patient verbalized understanding and all questions were answered.  Patient received an After-Visit Summary.    No orders of the defined types were placed in this encounter.    Note is dictated utilizing voice recognition software. Although note has been proof read prior to signing, occasional typographical errors still can be missed. If any questions arise, please do not hesitate to call for verification.   electronically signed by:  Howard Pouch, DO  [place]

## 2017-08-27 NOTE — Patient Instructions (Addendum)
Download my fitness pal app and track calories/water/exercise My calorie calculator (google) > calculate calories per day--> do this weekly. Would not recommend go below 1100 calories a day.  Increase exercise to 150 minutes of week of heart rate in the cardio zone (160-180).   Increase water to at least 100 ounces a day, more when exercising.  Avoid white pasta, rice, potato, sugar, flour--> get your cards from vegetables and fresh fruits.   Try to pick carbs from low glycemic index sources (google has resources)   Start belviq med, followup in 3 months. Sooner if needed .  Weight watchers is also a good source.

## 2017-09-01 NOTE — Progress Notes (Signed)
[facility] [place], [place] [address] Phone: [phone] Subjective:    I'm seeing this patient by the request  of:    CC: Back pain follow-up  [id number]  Abigail Kelley is a 37 y.o. female coming in with complaint of back pain. States she is very painful today.  Patient states of anything seems to be worsening.  Patient states that the manipulation comes out for couple days and then now seems to be worsening with some radicular pain and some may be potential weakness.  Patient rates the severity of pain is 7 out of 10.  Sometimes can wake her up at night.  Concerned because this is some of the worst pain that it has been recently.  Patient patient's previous x-rays did show a spondylolisthesis at L5-S1.     Past Medical History:  Diagnosis Date  . AMA (advanced maternal age) multigravida 35+   . Hypothyroidism   . Lumbar herniated disc   . MTHFR mutation ([facility])   . Newborn product of in vitro fertilization (IVF) pregnancy   . PCOS (polycystic ovarian syndrome)   . Thyroid disease    hypothyroid.    Past Surgical History:  Procedure Laterality Date  . CESAREAN SECTION    . CESAREAN SECTION N/A 03/01/2017   Procedure: REPEAT CESAREAN SECTION;  Surgeon: Sherlyn Hay, DO;  Location: [facility];  Service: Obstetrics;  Laterality: N/A;  [name] RNFA   Social History   Socioeconomic History  . Marital status: Married    Spouse name: Not on file  . Number of children: Not on file  . Years of education: Not on file  . Highest education level: Not on file  Occupational History  . Not on file  Social Needs  . Financial resource strain: Not on file  . Food insecurity:    Worry: Not on file    Inability: Not on file  . Transportation needs:    Medical: Not on file    Non-medical: Not on file  Tobacco Use  . Smoking status: Never Smoker  . Smokeless tobacco: Never Used  Substance and Sexual Activity  .  Alcohol use: No    Alcohol/week: 0.0 oz  . Drug use: No  . Sexual activity: Yes    Birth control/protection: Pill    Comment: Yasmin  Lifestyle  . Physical activity:    Days per week: Not on file    Minutes per session: Not on file  . Stress: Not on file  Relationships  . Social connections:    Talks on phone: Not on file    Gets together: Not on file    Attends religious service: Not on file    Active member of club or organization: Not on file    Attends meetings of clubs or organizations: Not on file    Relationship status: Not on file  Other Topics Concern  . Not on file  Social History Narrative   Married. [name] is her husband. 1 Child Verdis Frederickson).   Moved from [place] (11/2014)   MBA, Human Resources.    Drinks caffeine   Wears her seatbelt, wears her bike helmet   Smoke detector in the home.    Feels safe in her relationships.    No Known Allergies Family History  Problem Relation Age of Onset  . Hypertension Father   . Alzheimer's disease Paternal Grandmother   . Breast cancer Maternal Aunt   . Breast cancer Paternal  Aunt   . Alzheimer's disease Maternal Grandmother   . Lung cancer Maternal Grandfather   . Liver cancer Paternal Grandfather        melenoma metz  . Melanoma Paternal Grandfather      Past medical history, social, surgical and family history all reviewed in electronic medical record.  No pertanent information unless stated regarding to the chief complaint.   Review of Systems:Review of systems updated and as accurate as of 09/02/17  No headache, visual changes, nausea, vomiting, diarrhea, constipation, dizziness, abdominal pain, skin rash, fevers, chills, night sweats, weight loss, swollen lymph nodes, body aches, joint swelling,  chest pain, shortness of breath, mood changes.  Positive muscle aches  Objective  Blood pressure 130/90, pulse 80, height 5\' 9"  (1.753 m), weight 185 lb (83.9 kg), SpO2 97 %, currently breastfeeding. Systems examined below  as of 09/02/17   General: No apparent distress alert and oriented x3 mood and affect normal, dressed appropriately.  HEENT: Pupils equal, extraocular movements intact  Respiratory: Patient's speak in full sentences and does not appear short of breath  Cardiovascular: No lower extremity edema, non tender, no erythema  Skin: Warm dry intact with no signs of infection or rash on extremities or on axial skeleton.  Abdomen: Soft nontender  Neuro: Cranial nerves II through XII are intact, neurovascularly intact in all extremities with 2+ DTRs and 2+ pulses.  Lymph: No lymphadenopathy of posterior or anterior cervical chain or axillae bilaterally.  Gait normal with good balance and coordination.  MSK:  Non tender with full range of motion and good stability and symmetric strength and tone of shoulders, elbows, wrist, hip, knee and ankles bilaterally.  Back Exam:  Inspection: Loss of lordosis Motion: Flexion 25 deg worsening symptoms, Extension25 deg, Side Bending to 25 deg bilaterally,  Rotation to 35 deg bilaterally  SLR laying: Positive right XSLR laying: Negative  Palpable tenderness: Tender to palpation the paraspinal musculature lumbar spine right greater than left.Marland Kitchen FABER: negative. Sensory change: Gross sensation intact to all lumbar and sacral dermatomes.  Reflexes: 2+ at both patellar tendons, 2+ at achilles tendons, Babinski's downgoing.  Strength at foot  4 out of 5 strength with dorsiflexion of the right foot compared to the contralateral side   Impression and Recommendations:     This case required medical decision making of moderate complexity.      Note: This dictation was prepared with Dragon dictation along with smaller phrase technology. Any transcriptional errors that result from this process are unintentional.

## 2017-09-02 ENCOUNTER — Ambulatory Visit (INDEPENDENT_AMBULATORY_CARE_PROVIDER_SITE_OTHER): Payer: 59 | Admitting: Family Medicine

## 2017-09-02 ENCOUNTER — Encounter: Payer: Self-pay | Admitting: Family Medicine

## 2017-09-02 VITALS — BP 130/90 | HR 80 | Ht 69.0 in | Wt 185.0 lb

## 2017-09-02 DIAGNOSIS — G8929 Other chronic pain: Secondary | ICD-10-CM | POA: Diagnosis not present

## 2017-09-02 DIAGNOSIS — M4317 Spondylolisthesis, lumbosacral region: Secondary | ICD-10-CM

## 2017-09-02 DIAGNOSIS — M549 Dorsalgia, unspecified: Secondary | ICD-10-CM

## 2017-09-02 MED ORDER — MELOXICAM 15 MG PO TABS: 15.0000 mg | ORAL_TABLET | Freq: Every day | ORAL | 0 refills | Status: DC

## 2017-09-02 NOTE — Patient Instructions (Addendum)
I am sorry to hear no improvement.  Meloxicam daily  OK to take the muscle relaxer.  We will get MRI  After MRI we will discuss injections Once you have the injection then see me again 2-3 weeks after

## 2017-09-02 NOTE — Assessment & Plan Note (Signed)
Worsening symptoms now with radicular symptoms and weakness.  Concern for patient having potentially partial herniated disc at this time.  Pain has been going on for greater than 1 year.  Has done all conservative therapy including formal physical therapy here in this country [place].  Oral anti-inflammatories given to see if this will be beneficial but I do feel that advanced imaging with an MRI would be beneficial as well.  Patient could be a candidate for epidurals.  Depending on imaging will discuss further treatment management.  Spent  25 minutes with patient face-to-face and had greater than 50% of counseling including as described above in assessment and plan.

## 2017-09-07 ENCOUNTER — Encounter: Payer: Self-pay | Admitting: Family Medicine

## 2017-09-10 ENCOUNTER — Ambulatory Visit
Admission: RE | Admit: 2017-09-10 | Discharge: 2017-09-10 | Disposition: A | Discharge status: Home / Self Care | Payer: 59 | Source: Ambulatory Visit | Attending: Family Medicine | Admitting: Family Medicine

## 2017-09-10 ENCOUNTER — Encounter: Payer: Self-pay | Admitting: Family Medicine

## 2017-09-10 DIAGNOSIS — M549 Dorsalgia, unspecified: Principal | ICD-10-CM

## 2017-09-10 DIAGNOSIS — G8929 Other chronic pain: Secondary | ICD-10-CM

## 2017-09-10 DIAGNOSIS — M5116 Intervertebral disc disorders with radiculopathy, lumbar region: Secondary | ICD-10-CM | POA: Diagnosis not present

## 2017-09-13 ENCOUNTER — Other Ambulatory Visit: Payer: Self-pay

## 2017-09-13 DIAGNOSIS — M5416 Radiculopathy, lumbar region: Secondary | ICD-10-CM

## 2017-09-24 ENCOUNTER — Ambulatory Visit
Admission: RE | Admit: 2017-09-24 | Discharge: 2017-09-24 | Disposition: A | Discharge status: Home / Self Care | Payer: 59 | Source: Ambulatory Visit | Attending: Family Medicine | Admitting: Family Medicine

## 2017-09-24 DIAGNOSIS — M5416 Radiculopathy, lumbar region: Secondary | ICD-10-CM

## 2017-09-24 DIAGNOSIS — M4316 Spondylolisthesis, lumbar region: Secondary | ICD-10-CM | POA: Diagnosis not present

## 2017-09-24 MED ORDER — IOPAMIDOL (ISOVUE-M 200) INJECTION 41%
1.0000 mL | Freq: Once | INTRAMUSCULAR | Status: AC
  Administered 2017-09-24: 1 mL via EPIDURAL

## 2017-09-24 MED ORDER — METHYLPREDNISOLONE ACETATE 40 MG/ML INJ SUSP (RADIOLOG
120.0000 mg | Freq: Once | INTRAMUSCULAR | Status: AC
  Administered 2017-09-24: 120 mg via EPIDURAL

## 2017-09-24 NOTE — Discharge Instructions (Signed)

## 2017-10-15 DIAGNOSIS — L905 Scar conditions and fibrosis of skin: Secondary | ICD-10-CM | POA: Diagnosis not present

## 2017-10-15 DIAGNOSIS — D485 Neoplasm of uncertain behavior of skin: Secondary | ICD-10-CM | POA: Diagnosis not present

## 2017-11-24 ENCOUNTER — Encounter: Payer: Self-pay | Admitting: Internal Medicine

## 2017-11-24 ENCOUNTER — Other Ambulatory Visit: Payer: Self-pay | Admitting: Internal Medicine

## 2017-11-24 DIAGNOSIS — E063 Autoimmune thyroiditis: Principal | ICD-10-CM

## 2017-11-24 DIAGNOSIS — E038 Other specified hypothyroidism: Secondary | ICD-10-CM

## 2017-11-30 ENCOUNTER — Ambulatory Visit (INDEPENDENT_AMBULATORY_CARE_PROVIDER_SITE_OTHER): Payer: 59

## 2017-11-30 ENCOUNTER — Other Ambulatory Visit (INDEPENDENT_AMBULATORY_CARE_PROVIDER_SITE_OTHER): Payer: 59

## 2017-11-30 DIAGNOSIS — Z23 Encounter for immunization: Secondary | ICD-10-CM | POA: Diagnosis not present

## 2017-11-30 DIAGNOSIS — E063 Autoimmune thyroiditis: Secondary | ICD-10-CM

## 2017-11-30 DIAGNOSIS — E038 Other specified hypothyroidism: Secondary | ICD-10-CM

## 2017-11-30 LAB — T4, FREE: Free T4: 0.79 ng/dL (ref 0.60–1.60)

## 2017-11-30 LAB — TSH: TSH: 4.69 u[IU]/mL — AB (ref 0.35–4.50)

## 2017-11-30 NOTE — Progress Notes (Signed)
Per orders of Dr. Cruzita Lederer injection of flu shot given today by Lolita Rieger RMA . Patient tolerated injection well.

## 2017-12-01 ENCOUNTER — Other Ambulatory Visit: Payer: Self-pay | Admitting: Internal Medicine

## 2017-12-01 DIAGNOSIS — E038 Other specified hypothyroidism: Secondary | ICD-10-CM

## 2017-12-01 DIAGNOSIS — E063 Autoimmune thyroiditis: Principal | ICD-10-CM

## 2017-12-01 MED ORDER — LEVOTHYROXINE SODIUM 112 MCG PO TABS: 112.0000 ug | ORAL_TABLET | Freq: Every day | ORAL | 5 refills | Status: DC

## 2018-02-08 ENCOUNTER — Other Ambulatory Visit (INDEPENDENT_AMBULATORY_CARE_PROVIDER_SITE_OTHER): Payer: 59

## 2018-02-08 DIAGNOSIS — E063 Autoimmune thyroiditis: Secondary | ICD-10-CM

## 2018-02-08 DIAGNOSIS — E038 Other specified hypothyroidism: Secondary | ICD-10-CM | POA: Diagnosis not present

## 2018-02-08 LAB — T4, FREE: FREE T4: 0.95 ng/dL (ref 0.60–1.60)

## 2018-02-08 LAB — TSH: TSH: 1.9 u[IU]/mL (ref 0.35–4.50)

## 2018-02-24 ENCOUNTER — Ambulatory Visit (INDEPENDENT_AMBULATORY_CARE_PROVIDER_SITE_OTHER): Payer: 59 | Admitting: Internal Medicine

## 2018-02-24 ENCOUNTER — Encounter: Payer: Self-pay | Admitting: Internal Medicine

## 2018-02-24 VITALS — BP 130/70 | HR 80 | Ht 69.0 in | Wt 175.0 lb

## 2018-02-24 DIAGNOSIS — E038 Other specified hypothyroidism: Secondary | ICD-10-CM | POA: Diagnosis not present

## 2018-02-24 DIAGNOSIS — E063 Autoimmune thyroiditis: Secondary | ICD-10-CM

## 2018-02-24 MED ORDER — LEVOTHYROXINE SODIUM 112 MCG PO TABS: 112.0000 ug | ORAL_TABLET | Freq: Every day | ORAL | 3 refills | Status: DC

## 2018-02-24 NOTE — Patient Instructions (Signed)
Please continue Levothyroxine 112 mcg daily.  Take the thyroid hormone every day, with water, at least 30 minutes before breakfast, separated by at least 4 hours from: - acid reflux medications - calcium - iron - multivitamins  Please have labs checked at [facility] in 1.5-2 months.  Please return to see me as needed.

## 2018-02-24 NOTE — Progress Notes (Signed)
Patient ID: Abigail Kelley, female   DOB: 03/29/1980, 38 y.o.   MRN: [id number]    HPI  Abigail Kelley is a 38 y.o.-year-old female, initially referred by her PCP, Dr. Raoul Pitch, returning for management of Hashimoto's hypothyroidism.  Last visit 1 year and 2 months ago.  She is scheduled this appointment as she is moving out of state, to [place] for her job. She moved here from [place] in 09/2014. ObGyn: Dr. Quincy Kelley  She has fatigue and recently had hair loss >> now better.  When I last saw her she was pregnant after IVF in [place].  She delivered a healthy baby boy in 02/2017.  She has a girl born by IVF also in 2014.  She has previous 2 miscarriages in 2012.  She breastfed for ~9 mo.  She was diagnosed with hypothyroidism in 2010 while in [place] >> started on Levothyroxine 112 >> 150 during the pregnancy.  Pt is currently on levothyroxine 112 Mcg daily, taken: - in am - fasting - at least 30 min from b'fast - no Ca, Fe, MVI, PPIs - not on Biotin  Reviewed patient's TFTs: Lab Results  Component Value Date   TSH 1.90 02/08/2018   TSH 4.69 (H) 11/30/2017   TSH 8.19 (H) 08/09/2017   TSH 0.12 (L) 01/22/2017   TSH 1.41 08/20/2016   TSH 2.31 07/07/2016   TSH 5.44 (H) 05/28/2016   TSH 0.05 (L) 03/12/2016   TSH 3.10 02/07/2016   TSH 7.83 (H) 01/23/2016   FREET4 0.95 02/08/2018   FREET4 0.79 11/30/2017   FREET4 0.73 08/09/2017   FREET4 0.72 01/22/2017   FREET4 0.98 08/20/2016   FREET4 0.98 07/07/2016   FREET4 0.79 05/28/2016   FREET4 1.68 (H) 03/12/2016   FREET4 1.3 02/07/2016   FREET4 1.2 08/29/2015  12/23/2016: TSH 0.169, fT4 1.36 (0.82-1.77)  Her TPO antibodies were elevated-Hashimoto's thyroiditis: Component     Latest Ref Rng & Units 02/07/2016 03/12/2016  Thyrotropin Receptor Ab     <=16.0 % 9.4   TSI     <140 % baseline <89   Thyroglobulin Ab     <2 IU/mL  <1  Thyroperoxidase Ab SerPl-aCnc     <9 IU/mL  707 (H)   Thyroid ultrasound in 02/07/2016 report  reviewed: Slightly diminutive and markedly heterogeneous appearing thyroid gland without discrete nodule or mass.  Pt denies: - feeling nodules in neck - hoarseness - dysphagia - choking - SOB with lying down  She has no FH of thyroid disorders. No FH of thyroid cancer. No h/o radiation tx to head or neck.  No herbal supplements. No Biotin use. No recent steroids use.   Pt. also has a history of PCOS, Lumbar herniated disk. C section 2014.  During the last pregnancy, she had an abnormal OGTT >> 81-186 (ULN 180)-145-72 - early in the pregnancy  ROS: Constitutional: no weight gain/no weight loss, + fatigue, no subjective hyperthermia, no subjective hypothermia Eyes: no blurry vision, no xerophthalmia ENT: no sore throat, + see HPI Cardiovascular: no CP/no SOB/no palpitations/no leg swelling Respiratory: no cough/no SOB/no wheezing Gastrointestinal: no N/no V/no D/no C/no acid reflux Musculoskeletal: no muscle aches/no joint aches Skin: no rashes, no hair loss Neurological: no tremors/no numbness/no tingling/no dizziness  I reviewed pt's medications, allergies, PMH, social hx, family hx, and changes were documented in the history of present illness. Otherwise, unchanged from my initial visit note.   Past Medical History:  Diagnosis Date  . AMA (advanced maternal age) multigravida 85+   .  Hypothyroidism   . Lumbar herniated disc   . MTHFR mutation ([place])   . Newborn product of in vitro fertilization (IVF) pregnancy   . PCOS (polycystic ovarian syndrome)   . Thyroid disease    hypothyroid.    Past Surgical History:  Procedure Laterality Date  . CESAREAN SECTION    . CESAREAN SECTION N/A 03/01/2017   Procedure: REPEAT CESAREAN SECTION;  Surgeon: Sherlyn Hay, DO;  Location: [facility];  Service: Obstetrics;  Laterality: N/A;  [name] RNFA   Social History   Social History  . Marital status: Married    Spouse name: N/A  . Number of children: 1    Occupational History  . Human resources   Social History Main Topics  . Smoking status: Never Smoker  . Smokeless tobacco: Never Used  . Alcohol use No  . Drug use: No  . Sexual activity: Yes    Birth control/ protection: Pill   Social History Narrative   Married. [name] - husband. Lee.   Moved from [place] (11/2014)   MBA, Human Resources.    Current Outpatient Medications on File Prior to Visit  Medication Sig Dispense Refill  . cyclobenzaprine (FLEXERIL) 5 MG tablet Take 1 tablet (5 mg total) by mouth 2 (two) times daily as needed for muscle spasms. 60 tablet 1  . gabapentin (NEURONTIN) 100 MG capsule Take 2 capsules (200 mg total) by mouth at bedtime. 60 capsule 3  . levothyroxine (SYNTHROID, LEVOTHROID) 112 MCG tablet Take 1 tablet (112 mcg total) by mouth daily. 45 tablet 5  . Lorcaserin HCl ER (BELVIQ XR) 20 MG TB24 Take 1 tablet by mouth daily. 30 tablet 2  . meloxicam (MOBIC) 15 MG tablet Take 1 tablet (15 mg total) by mouth daily. 30 tablet 0  . Prenatal Vit-Fe Fumarate-FA (PRENATAL MULTIVITAMIN) TABS tablet Take 1 tablet by mouth daily at 12 noon. 100 tablet 3   No current facility-administered medications on file prior to visit.    No Known Allergies Family History  Problem Relation Age of Onset  . Hypertension Father   . Alzheimer's disease Paternal Grandmother   . Breast cancer Maternal Aunt   . Breast cancer Paternal Aunt   . Alzheimer's disease Maternal Grandmother   . Lung cancer Maternal Grandfather   . Liver cancer Paternal Grandfather        melenoma metz  . Melanoma Paternal Grandfather    PE: BP 130/70   Pulse 80   Ht 5\' 9"  (1.753 m)   Wt 175 lb (79.4 kg)   SpO2 98%   BMI 25.84 kg/m  Wt Readings from Last 3 Encounters:  02/24/18 175 lb (79.4 kg)  09/02/17 185 lb (83.9 kg)  08/27/17 187 lb 6 oz (85 kg)   Constitutional: overweight, in NAD Eyes: PERRLA, EOMI, no exophthalmos ENT: moist mucous membranes, no thyromegaly, no  cervical lymphadenopathy Cardiovascular: RRR, No MRG Respiratory: CTA B Gastrointestinal: abdomen soft, NT, ND, BS+ Musculoskeletal: no deformities, strength intact in all 4 Skin: moist, warm, no rashes Neurological: no tremor with outstretched hands, DTR normal in all 4  ASSESSMENT: 1. Hashimoto's Hypothyroidism  PLAN:  1. Patient with longstanding Hashimoto's hypothyroidism returning after a longer absence.  I last saw her in 12/2016 and at that time she was pregnant.  She had a healthy pregnancy.  We had her on levothyroxine 150 mcg daily and she is now on the lower dose, 100 mcg daily with perfect TFTs obtained last month. -  pt feels good on this dose but has fatigue - unclear if from the stress of the move (moves this weekend) or having 2 small children - we discussed about taking the thyroid hormone every day, with water, >30 minutes before breakfast, separated by >4 hours from acid reflux medications, calcium, iron, multivitamins. Pt. is taking it correctly. - will check new thyroid las in ~2 mo from the previous - at [facility] while in [place] - She inquires about the need for another thyroid ultrasound and I discouraged further ultrasound since there was no worrisome pathology and no nodule seen on the most recent ultrasound from 2017 - I will have her return to see me as needed since she is moving out of town  - time spent with the patient: 15 min, of which >50% was spent in obtaining information about her symptoms, reviewing her previous labs, evaluations, and treatments, counseling her about her condition (please see the discussed topics above), and developing a plan to further investigate and treat it; she had a number of questions which I addressed.  Orders Placed This Encounter  Procedures  . TSH  . T4, free    Philemon Kingdom, MD PhD [facility] Endocrinology

## 2018-02-28 ENCOUNTER — Encounter: Payer: Self-pay | Admitting: Family Medicine

## 2018-02-28 ENCOUNTER — Telehealth: Payer: 59 | Admitting: Family Medicine

## 2018-02-28 DIAGNOSIS — B009 Herpesviral infection, unspecified: Secondary | ICD-10-CM

## 2018-02-28 MED ORDER — VALACYCLOVIR HCL 1 G PO TABS: 2000.0000 mg | ORAL_TABLET | Freq: Two times a day (BID) | ORAL | 0 refills | Status: AC

## 2018-02-28 NOTE — Progress Notes (Signed)
We are sorry that you are not feeling well.  Here is how we plan to help!  Based on what you have shared with me it does look like you have a viral infection.    Most cold sores or fever blisters are small fluid filled blisters around the mouth caused by herpes simplex virus.  The most common strain of the virus causing cold sores is herpes simplex virus 1.  It can be spread by skin contact, sharing eating utensils, or even sharing towels.  Cold sores are contagious to other people until dry. (Approximately 5-7 days).  Wash your hands. You can spread the virus to your eyes through handling your contact lenses after touching the lesions.  Most people experience pain at the sight or tingling sensations in their lips that may begin before the ulcers erupt.  Herpes simplex is treatable but not curable.  It may lie dormant for a long time and then reappear due to stress or prolonged sun exposure.  Many patients have success in treating their cold sores with an over the counter topical called Abreva.  You may apply the cream up to 5 times daily (maximum 10 days) until healing occurs.  If you would like to use an oral antiviral medication to speed the healing of your cold sore, I have sent a prescription to your local pharmacy Valacyclovir 2 gm twice daily for 1 day    HOME CARE:   Wash your hands frequently.  Do not pick at or rub the sore.  Don't open the blisters.  Avoid kissing other people during this time.  Avoid sharing drinking glasses, eating utensils, or razors.  Do not handle contact lenses unless you have thoroughly washed your hands with soap and warm water!  Avoid oral sex during this time.  Herpes from sores on your mouth can spread to your partner's genital area.  Avoid contact with anyone who has eczema or a weakened immune system.  Cold sores are often triggered by exposure to intense sunlight, use a lip balm containing a sunscreen (SPF 30 or higher).  GET HELP RIGHT AWAY  IF:   Blisters look infected.  Blisters occur near or in the eye.  Symptoms last longer than 10 days.  Your symptoms become worse.  MAKE SURE YOU:   Understand these instructions.  Will watch your condition.  Will get help right away if you are not doing well or get worse.    Your e-visit answers were reviewed by a board certified advanced clinical practitioner to complete your personal care plan.  Depending upon the condition, your plan could have  Included both over the counter or prescription medications.    Please review your pharmacy choice.  Be sure that the pharmacy you have chosen is open so that you can pick up your prescription now.  If there is a problem you can message your provider in MyChart to have the prescription routed to another pharmacy.    Your safety is important to us.  If you have drug allergies check our prescription carefully.  For the next 24 hours you can use MyChart to ask questions about today's visit, request a non-urgent call back, or ask for a work or school excuse from your e-visit provider.  You will get an email in the next two days asking about your experience.  I hope that your e-visit has been valuable and will speed your recovery.  

## 2018-03-04 ENCOUNTER — Encounter: Payer: Self-pay | Admitting: Internal Medicine

## 2018-03-04 MED ORDER — LEVOTHYROXINE SODIUM 112 MCG PO TABS: 112.0000 ug | ORAL_TABLET | Freq: Every day | ORAL | 3 refills | Status: AC

## 2018-06-02 ENCOUNTER — Ambulatory Visit: Payer: 59 | Admitting: Family Medicine

## 2018-06-03 ENCOUNTER — Encounter: Payer: Self-pay | Admitting: Family Medicine

## 2018-06-03 ENCOUNTER — Other Ambulatory Visit: Payer: Self-pay

## 2018-06-03 ENCOUNTER — Ambulatory Visit (INDEPENDENT_AMBULATORY_CARE_PROVIDER_SITE_OTHER): Payer: 59 | Admitting: Family Medicine

## 2018-06-03 VITALS — Ht 69.0 in

## 2018-06-03 DIAGNOSIS — E282 Polycystic ovarian syndrome: Secondary | ICD-10-CM | POA: Insufficient documentation

## 2018-06-03 DIAGNOSIS — M4317 Spondylolisthesis, lumbosacral region: Secondary | ICD-10-CM | POA: Diagnosis not present

## 2018-06-03 DIAGNOSIS — M5126 Other intervertebral disc displacement, lumbar region: Secondary | ICD-10-CM | POA: Diagnosis not present

## 2018-06-03 DIAGNOSIS — M5416 Radiculopathy, lumbar region: Secondary | ICD-10-CM

## 2018-06-03 MED ORDER — TRAMADOL HCL 50 MG PO TABS: 50.0000 mg | ORAL_TABLET | Freq: Three times a day (TID) | ORAL | 0 refills | Status: AC | PRN

## 2018-06-03 MED ORDER — PREDNISONE 20 MG PO TABS: ORAL_TABLET | ORAL | 0 refills | Status: AC

## 2018-06-03 MED ORDER — CYCLOBENZAPRINE HCL 5 MG PO TABS: 5.0000 mg | ORAL_TABLET | Freq: Two times a day (BID) | ORAL | 1 refills | Status: AC | PRN

## 2018-06-03 MED ORDER — DICLOFENAC SODIUM ER 100 MG PO TB24: 100.0000 mg | ORAL_TABLET | Freq: Every day | ORAL | 1 refills | Status: AC

## 2018-06-03 NOTE — Patient Instructions (Signed)
Prescribed - steroid taper (prednisone) take as label reads.  - flexeril, also called cyclobenzaprine, a muscle relaxer.  This may cause some sedation.  Definitely take before bed, can also consider taking one other time throughout the day if needed and does not cause sedation. -Voltaren, also called diclofenac, start once a day with food after steroid is completed.  This is an anti-inflammatory medication that can help keep condition better controlled between flares. -Tramadol prescribed for pain.  May take up to 1 tab every 8 hours if needed.  Use sparingly.  I have referred to neurology in your area.

## 2018-06-03 NOTE — Progress Notes (Signed)
VIRTUAL VISIT VIA VIDEO  I connected with Abigail Kelley on 06/03/18 at  3:40 PM EDT by a video enabled telemedicine application and verified that I am speaking with the correct person using two identifiers. Location patient: Home Location provider: [facility], Office Persons participating in the virtual visit: Patient, Dr. Raoul Pitch and R.Baker, LPN  I discussed the limitations of evaluation and management by telemedicine and the availability of in person appointments. The patient expressed understanding and agreed to proceed.   SUBJECTIVE Chief Complaint  Patient presents with  . Back Pain    Low back pain. Pt went to sports MD a while back and had inj and MRI, which has helped but it starting to hurt again     HPI:  Patient presents today via virtual video to discuss her lower back pain.  She reports her back pain has been increasing again over the last 2 weeks.  She was seen last year in June for this condition.  She was referred to sports medicine, had an MRI and received an injection at that time.  She did recall some mild benefit to the injection, but the pain has never completely gone away.  She has been suffering from at least some lower back pain for many years.  States over the last 2 weeks she has had an increase of lower lumbar pain spine feels tender to touch, radiation of pain again down her right leg is worse than the left but also present in the left leg.  She states her legs have also "crumbled" at least 2 times over the last 2 weeks causing her to fall.  States she is also incontinence x2 over the last 2 weeks.  She is uncertain if this related to her lower back but is new for her. Patient has recently moved to [facility] for [place]  She has been unable to establish with a new provider secondary to COVID-19 outbreak.  MRI 09/10/2017: IMPRESSION: L5 chronic bilateral pars defects with L5-S1 grade 2 anterolisthesis and  accelerated disc degeneration. There are degenerative cyst projecting posteriorly from the defects and L4-5 interspinous degenerative change. L5-S1 right foraminal narrowing with L5 impingement. ROS: See pertinent positives and negatives per HPI.  Patient Active Problem List   Diagnosis Date Noted  . Spondylolisthesis at L5-S1 level 08/09/2017  . Nonallopathic lesion of sacral region 08/09/2017  . Nonallopathic lesion of lumbosacral region 08/09/2017  . Nonallopathic lesion of thoracic region 08/09/2017  . Patient is a currently breast-feeding mother 07/14/2017  . Hypothyroidism due to Hashimoto's thyroiditis 06/19/2015  . 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 History of MTHFR mutation 04/13/2015    Social History   Tobacco Use  . Smoking status: Never Smoker  . Smokeless tobacco: Never Used  Substance Use Topics  . Alcohol use: No    Alcohol/week: 0.0 standard drinks    Current Outpatient Medications:  .  levothyroxine (SYNTHROID, LEVOTHROID) 112 MCG tablet, Take 1 tablet (112 mcg total) by mouth daily., Disp: 90 tablet, Rfl: 3 .  [name] 3-0.02 MG tablet, Take 1 tablet by mouth daily., Disp: , Rfl:   No Known Allergies  OBJECTIVE: Ht 5\' 9"  (1.753 m)   LMP 05/13/2018 (Exact Date)   BMI 25.84 kg/m  Gen: No acute distress. Nontoxic in appearance.  HENT: AT. [place].  MMM.  Eyes:Pupils Equal Round Reactive to light, Extraocular movements intact,  Conjunctiva without redness, discharge or icterus. Chest: Cough and shortness of breath  not present.  Skin: no rashes, purpura or petechiae.  Neuro:  Normal gait. Alert. Oriented x3   ASSESSMENT AND PLAN: Dola Lunsford is a 38 y.o. female present for  Spondylolisthesis at L5-S1 level/Herniated lumbar intervertebral disc/Lumbar radiculopathy Patient's MRI studies from last year proved "L5-S1:Disc narrowing and desiccation with bulging. There is a small right eccentric disc protrusion. Degenerative cyst projects posteriorly from the  chronic pars defects. Mild left and more moderate right foraminal narrowing. The right L5 nerve root is flattened on sagittal images. Patent spinal canal" -Prescribed prednisone 10-day taper, Flexeril and tramadol for acute discomfort.  Had at least some relief with this with her last flare. -Tramadol 50 mg 3 times daily as needed x5 days prescribed for severe pain. -Voltaren to start after prednisone taper to take daily. -Referral to neurosurgeon in the [place] area. -She was advised if pain is worsening she should be seen in person at either an urgent care or emergency room in the [place] area. -Follow-up PRN.   Howard Pouch, DO 06/03/2018

## 2018-06-28 ENCOUNTER — Encounter (INDEPENDENT_AMBULATORY_CARE_PROVIDER_SITE_OTHER): Payer: Self-pay | Admitting: Family Medicine

## 2018-06-28 ENCOUNTER — Ambulatory Visit (INDEPENDENT_AMBULATORY_CARE_PROVIDER_SITE_OTHER): Payer: No Typology Code available for payment source | Admitting: Family Medicine

## 2018-06-28 VITALS — BP 134/92 | HR 89 | Temp 98.1°F | Resp 12 | Ht 68.0 in | Wt 179.0 lb

## 2018-06-28 DIAGNOSIS — E039 Hypothyroidism, unspecified: Secondary | ICD-10-CM

## 2018-06-28 DIAGNOSIS — M5136 Other intervertebral disc degeneration, lumbar region: Secondary | ICD-10-CM

## 2018-06-28 NOTE — Progress Notes (Signed)
Subjective:      Date: 06/28/2018 10:05 PM   Patient ID: Peggy Kennedy is a 38 y.o. female.    Chief Complaint:  Chief Complaint   Patient presents with   . Back Pain     Pt is present to establish care and discuss back pain. Pt states x several years due to herniated disc. pt states this last month sx have worsen and making daily activities difficult   . Hypothyroidism     pt would like to discuss chronic issues.    . Contraception     Pt would like a refill     HPI:  Hypothyroidism   The patient is being seen for an initial evaluation of an existing diagnosis of hypothyroidism. Hypothyroidism is classified as due to Hashimoto's thyroiditis. Interval events include an endocrine consult.  Interval symptoms include fatigue. There is no interval history of anxiety, coarse/dry hair, coarse/dry skin and menstrual problem. Patient is compliant with the current regimen. The patient's response to medication is. TSH at goal on last evaluation 01/2018 1.69       History of Back Pain:  History of herniated disc lumbar region.  She has had periodic pain - has had exacerbation of pain with her pregnancies- the last     imaging studies:  09/2017   Lumbar MRI: L5-S1 grade 2 anterolisthesis and disc degeneration and L4-5 interspinous degenerative changes and L5- S1 foraminal narrowing  With L5 impingement.    She does have Right more than left sided radiculopathy. Had PT 4 years prior. She had been seen by neurosurgery received Lumbar injections - 10/2017 with brief pain relief. She has tried Tylenol and muscle relaxant maybe once a day as needed.  Pain has been 'really bad' x 1 month- she has admittedly been carrying her kids - the youngest 54 months and the other 5 years.  She has been having some pain and stiffness usually in the am.  She had fallen x 2- unclear regarding the cause of her fall- she does not recall mis-stepping nor tripping. She does not recall if her legs "gave away or if pain was the cause"      Problem    Acquired Hypothyroidism   Ddd (Degenerative Disc Disease), Lumbar       Problem List:  Patient Active Problem List   Diagnosis   . Acquired hypothyroidism   . DDD (degenerative disc disease), lumbar       Current Medications:  Current Outpatient Medications   Medication Sig Dispense Refill   . levothyroxine (SYNTHROID) 112 MCG tablet Take 112 mcg by mouth Once a day at 6:00am     . drospirenone-ethinyl estradiol ([name]) 3-0.02 MG per tablet Take 1 tablet by mouth daily 84 tablet 1     No current facility-administered medications for this visit.        Allergies:  No Known Allergies    Past Medical History:  Past Medical History:   Diagnosis Date   . Back pain    . Hypothyroid        Past Surgical History:  Past Surgical History:   Procedure Laterality Date   . CESAREAN SECTION  02/2017   . csection  2014       Family History:  History reviewed. No pertinent family history.    Social History:  Social History     Socioeconomic History   . Marital status: Unknown     Spouse name: Not on file   . Number of  children: Not on file   . Years of education: Not on file   . Highest education level: Not on file   Occupational History   . Not on file   Social Needs   . Financial resource strain: Not on file   . Food insecurity:     Worry: Not on file     Inability: Not on file   . Transportation needs:     Medical: Not on file     Non-medical: Not on file   Tobacco Use   . Smoking status: Never Smoker   . Smokeless tobacco: Never Used   Substance and Sexual Activity   . Alcohol use: Yes     Comment: occassionally   . Drug use: Never   . Sexual activity: Yes     Partners: Male     Birth control/protection: Pill     Comment: [name]   Lifestyle   . Physical activity:     Days per week: Not on file     Minutes per session: Not on file   . Stress: Not on file   Relationships   . Social connections:     Talks on phone: Not on file     Gets together: Not on file     Attends religious service: Not on file     Active member of club or  organization: Not on file     Attends meetings of clubs or organizations: Not on file     Relationship status: Not on file   . Intimate partner violence:     Fear of current or ex partner: Not on file     Emotionally abused: Not on file     Physically abused: Not on file     Forced sexual activity: Not on file   Other Topics Concern   . Not on file   Social History Narrative   . Not on file       The following sections were reviewed this encounter by the provider:   Tobacco  Allergies  Meds  Problems  Med Hx  Surg Hx  Fam Hx         ROS:  Review of Systems   Constitutional: Positive for fatigue. Negative for fever and unexpected weight change.   Gastrointestinal:        No fecal incontinence   Endocrine: Negative for cold intolerance and heat intolerance.   Genitourinary: Negative for menstrual problem.        No urinary incontinence   Neurological: Negative for weakness and headaches.   Psychiatric/Behavioral: Negative for sleep disturbance. The patient is not nervous/anxious.         Objective:     Vitals:  BP (!) 134/92 (BP Site: Left arm, Patient Position: Sitting, Cuff Size: Large)   Pulse 89   Temp 98.1 F (36.7 C) (Oral)   Resp 12   Ht 1.727 m (5\' 8" )   Wt 81.2 kg (179 lb)   LMP 05/31/2018   BMI 27.22 kg/m     Physical Exam:  Physical Exam  Vitals signs and nursing note reviewed.   Constitutional:       General: She is not in acute distress.     Appearance: Normal appearance. She is not ill-appearing.   Neck:      Musculoskeletal: Normal range of motion.      Thyroid: No thyromegaly.   Cardiovascular:      Rate and Rhythm: Normal rate and regular rhythm.  Pulmonary:      Effort: Pulmonary effort is normal. No respiratory distress.      Breath sounds: Normal breath sounds.   Musculoskeletal:      Comments: She does have scoliotic changes at the lumbar levels - pain with ROM testing at the LS spine-especially on flexion. Pain on palpation of the paravertebral musculature right>left at the mid  to lower lumbar region.  No decreased strength.    Neurological:      Mental Status: She is alert and oriented to person, place, and time.      Comments: Reflexes are symmetrically depressed   Psychiatric:         Mood and Affect: Mood normal.         Behavior: Behavior normal.         Assessment/Plan:       1. DDD (degenerative disc disease), lumbar  - Ambulatory referral to Pain Clinic  Chronic pain with acute exacerbation  - Recommend muscle relaxant (such as flexeril ( which patient has). Recommend warm compresses.  - strongly advised she avoid trying to pick up heavy objects (such as her kids) frequently  - Trial of NSAIDS (ex Advil) with food     2. Acquired hypothyroidism  - TSH; Future  - T3, free; Future  - T4, free; Future  Chronic/stable  Continue Levothyroxine  Labs as indicated above pending.    Medications and dosing intervals reviewed at this visit    Return in about 1 month (around 07/29/2018).  Follow up sooner for persistent or worsening symptoms.        Philipp Ovens, MD

## 2018-06-28 NOTE — Progress Notes (Signed)
Have you seen any specialists/other providers since your last visit with us?    No    Arm preference verified?   Yes    The patient is due for nothing at this time, HM is up-to-date.

## 2018-06-30 NOTE — Progress Notes (Signed)
Last filled-[facility].   Last o/v May 2020.  Pt has an upcoming appointment on July 29 2018 with Dr. Wilkie Aye.  Queued up 90 day supply with 3 refills.

## 2018-07-02 MED ORDER — DROSPIRENONE-ETHINYL ESTRADIOL 3-0.02 MG PO TABS
1.0000 | ORAL_TABLET | Freq: Every day | ORAL | 1 refills | Status: DC
Start: 2018-07-02 — End: 2019-02-24

## 2018-07-03 DIAGNOSIS — E039 Hypothyroidism, unspecified: Secondary | ICD-10-CM | POA: Insufficient documentation

## 2018-07-03 DIAGNOSIS — M5136 Other intervertebral disc degeneration, lumbar region: Secondary | ICD-10-CM | POA: Insufficient documentation

## 2018-07-13 ENCOUNTER — Other Ambulatory Visit (FREE_STANDING_LABORATORY_FACILITY): Payer: No Typology Code available for payment source

## 2018-07-13 DIAGNOSIS — E039 Hypothyroidism, unspecified: Secondary | ICD-10-CM

## 2018-07-13 LAB — TSH: TSH: 2.45 u[IU]/mL (ref 0.35–4.94)

## 2018-07-13 LAB — T3, FREE: T3, Free: 3.47 pg/mL (ref 1.71–3.71)

## 2018-07-13 LAB — T4, FREE: T4 Free: 1.11 ng/dL (ref 0.70–1.48)

## 2018-07-19 ENCOUNTER — Encounter (INDEPENDENT_AMBULATORY_CARE_PROVIDER_SITE_OTHER): Payer: Self-pay

## 2018-07-22 ENCOUNTER — Ambulatory Visit (INDEPENDENT_AMBULATORY_CARE_PROVIDER_SITE_OTHER): Payer: No Typology Code available for payment source | Admitting: Neurological Surgery

## 2018-07-22 ENCOUNTER — Encounter (INDEPENDENT_AMBULATORY_CARE_PROVIDER_SITE_OTHER): Payer: Self-pay | Admitting: Neurological Surgery

## 2018-07-23 IMAGING — DX DG LUMBAR SPINE COMPLETE 4+V
5 series · 6 of 6 positions shown · non-contrast
Comparison: None

CLINICAL DATA: Recently worsened this pain since the birth of her
child 4 months ago, prior disc herniation diagnosed 4-5 years ago,
RIGHT side low back pain radiating down RIGHT leg

EXAM:
LUMBAR SPINE - COMPLETE 4+ VIEW

[l-spine ap]
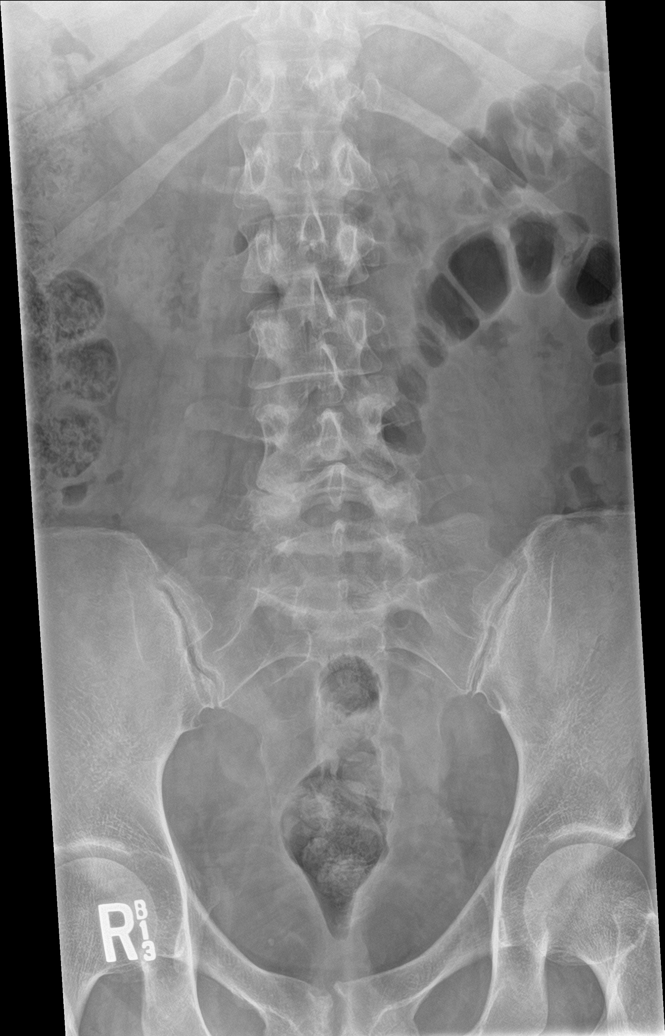

[Series 2: l-spine obl · 0.14mm/px · 2 of 2 slices shown (1 of 2)]
[im 1/2]
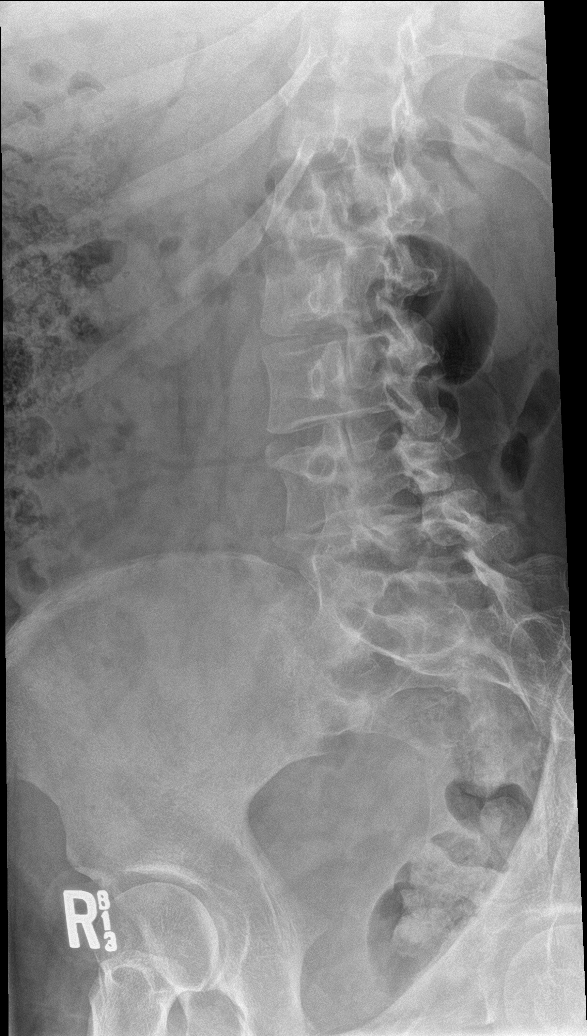
[im 2/2]
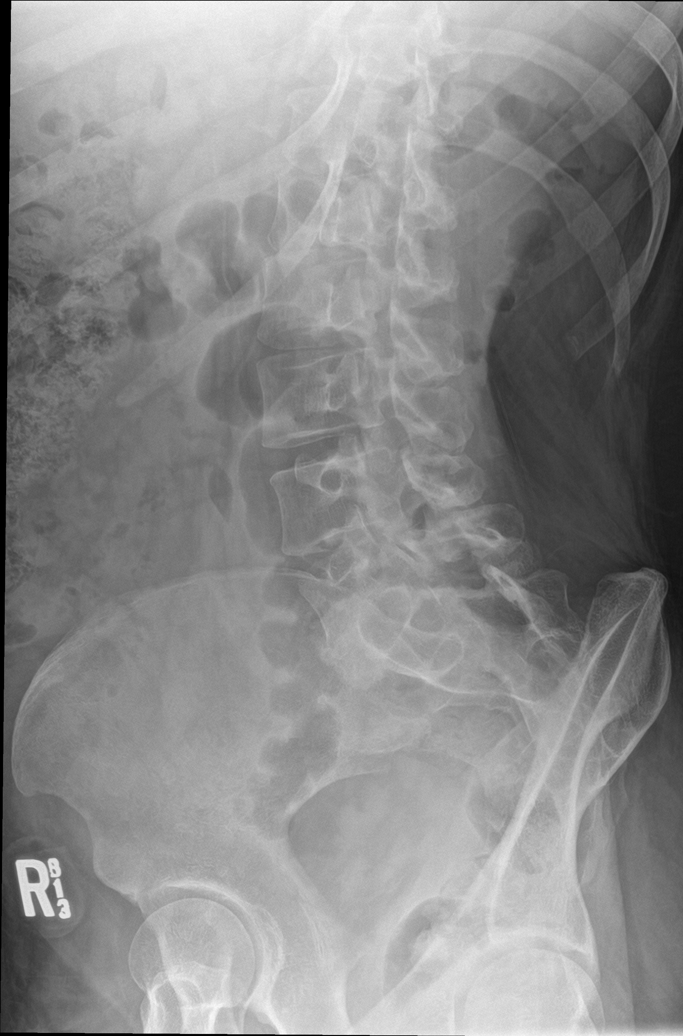

[l-spine obl (2 of 2)]
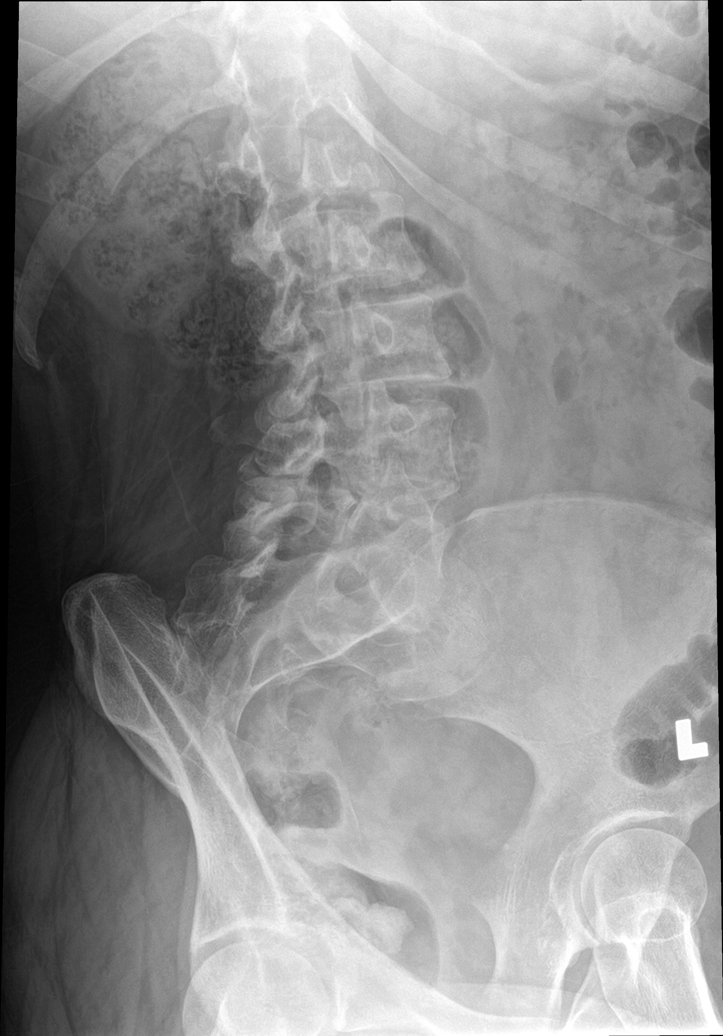

[l-spine lat]
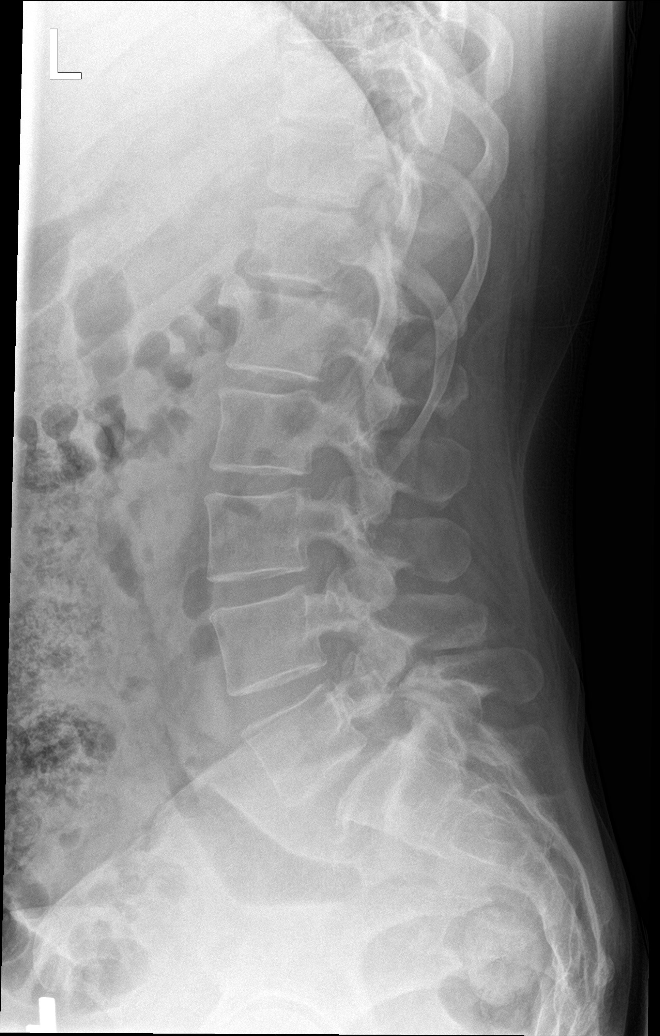

[l-spine spot]
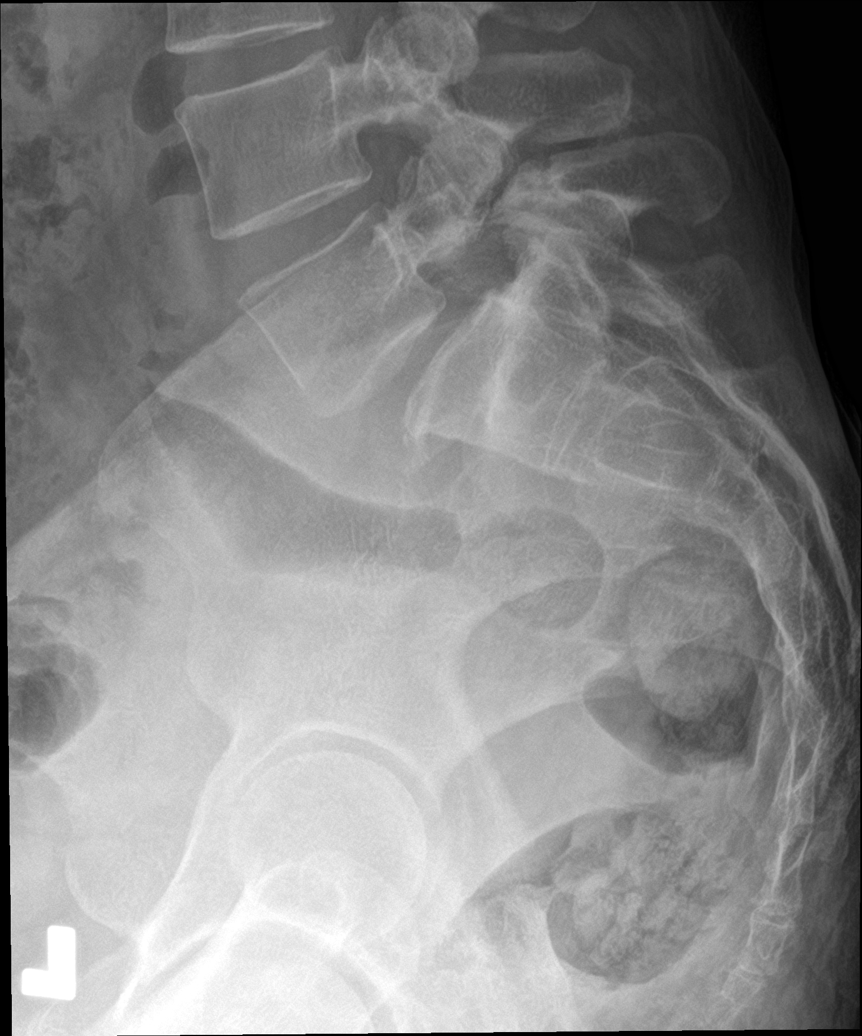

[6 of 6 positions shown; findings below may reference images not displayed]

FINDINGS: Five non-rib-bearing lumbar vertebra.

Vertebral body and disc space heights maintained.

Grade 1 anterolisthesis L5-S1 secondary to BILATERAL spondylolysis
of L5.

No fracture, additional subluxation or bone destruction.

SI joints preserved.
IMPRESSION: Grade 1 spondylolisthesis L5-S1 secondary to BILATERAL spondylolysis
L5.

## 2018-07-24 NOTE — Progress Notes (Signed)
NEUROSURGERY ATTENDING - CONSULTATION NOTE      Date: 07/25/2018  Patient Name: ,Peggy Kennedy    Please CC and send a report to:  Patient Care Team:  Philipp Ovens, MD as PCP - General (Family Medicine)    Diagnosis / Chief Complaint:     Low back pain radiating into the bilateral lower extremities, right worse than left.    History of Present Illness:     I was asked by Dr. Lyndel Pleasure to see Peggy Kennedy in consultation in order to render my professional opinion regarding the aforementioned chief complaint.    [name] is a 38 y.o. right-handed female with a history of hypothyroid who presents with a chronic history of low back pain worsening over the past 15 months radiating into the bilateral lower extremities, right worse than left.  The patient reports that she is always had some level of back pain but over the past 5 years and is gotten stronger.  Ever since giving birth to her 76-month-old son, symptoms have become increasingly worse as well.  Pain is constant.  Aggravating factors include standing up, working and sitting down for a long time.  Alleviating factors include sitting down and stretching.    She has tried medications including anti-inflammatories, oral steroids, nonnarcotic medications, narcotic medications and muscle relaxants.  She tries to stay away from medications because they make her drowsy.  Bed rest and exercise help improve her pain.  She underwent a course of physical therapy 3 years ago.  Chiropractic manipulation and 1 L5 epidural steroid injection performed in [place] provided her no significant relief.  She denies a previous history of spine surgery.    She rates pain her low back a 7-8/10 usually but today is a 7/10.  Right leg pain is rated a 7-8/10.  Left leg pain is rated a 4/10.  Right leg pain is worse than her left leg pain.  50% of her pain is located in the low back versus 50% in the bilateral lower extremities.  Left leg pain  typically only radiates to the left buttock.  Right leg pain radiates posteriorly down the right lower extremity into the foot.  Pain worsens with standing and walking.  Pain improves with resting.    She denies any specific focal motor weakness.  She denies any sensory changes including numbness and tingling.  She denies any clumsiness in the hands.  She denies any difficulty with balance.  She reports difficulty walking longer than 5-10 minutes due to low back pain and leg pain.  She denies any bowel or bladder dysfunction.  She denies any difficulty with sexual functioning.  She reports that her sleep is affected by her condition.    History was obtained from chart review and the patient.    Review of Systems:     A comprehensive review of systems was performed and revealed the following:    Constitutional: -negative for - fever, chills, sweating, fatigue, change in activity  General: negative for - recent weight gain, recent weight loss, appetite change, headaches  Eyes: negative for - blurry vision, double vision, eye pain, light sensitivity, eye discharge  ENT: negative for - hearing loss, nasal discharge, hoarseness, sore throat, swallowing difficulty  Heart: negative for - chest pain or tightness, palpitations, fainting, other heart trouble  Lungs: negative for - chronic cough, shortness of breath, wheezing, sleep apnea, hemoptysis  Gastrointestinal: negative for - abdominal pain, nausea, vomiting, diarrhea, constipation, bloody stool  Genito-Urinary: negative for -  urinary retention, urinary incontinence, urinary urgency, urinary discharge  Musculoskeletal: negative for - joint swelling, joint pain, leg swelling, leg cramping, fractures  Neurological: negative for - dizziness, tremors, memory loss, speech difficulty, seizures  Hematological and Lymphatic: negative for - easy bleeding, easy bruising, swollen nodes  Skin: moles or skin lesions negative for - rash, enlarged glands, color change, infection   Psychaitric: negative for - anxiety, depression, confusion, excessive stress, suicidal thoughts    All other systems were reviewed by me and are negative.        Color Code  Yellow - Numbness sensation  Purple - Pins & Needles sensation  Red     - Burning sensation  Blue    - Aching pain  Green Lambert Mody and Stabbing pain  Manson Passey - Other    Past Medical History:     Past Medical History:   Diagnosis Date   . Back pain    . Hypothyroid        Past Surgical History:     Past Surgical History:   Procedure Laterality Date   . CESAREAN SECTION  02/2017   . csection  2014       Family History:     Family History   Problem Relation Age of Onset   . No known problems Mother    . No known problems Father        Social History:     Social History     Socioeconomic History   . Marital status: Unknown     Spouse name: None   . Number of children: None   . Years of education: None   . Highest education level: None   Occupational History   . None   Social Needs   . Financial resource strain: None   . Food insecurity     Worry: None     Inability: None   . Transportation needs     Medical: None     Non-medical: None   Tobacco Use   . Smoking status: Never Smoker   . Smokeless tobacco: Never Used   Substance and Sexual Activity   . Alcohol use: Yes     Comment: occassionally   . Drug use: Never   . Sexual activity: Yes     Partners: Male     Birth control/protection: Pill     Comment: [name]   Lifestyle   . Physical activity     Days per week: None     Minutes per session: None   . Stress: None   Relationships   . Social Wellsite geologist on phone: None     Gets together: None     Attends religious service: None     Active member of club or organization: None     Attends meetings of clubs or organizations: None     Relationship status: None   . Intimate partner violence     Fear of current or ex partner: None     Emotionally abused: None     Physically abused: None     Forced sexual activity: None   Other Topics Concern   . None    Social History Narrative   . None     Peggy Kennedy is married.  She has 3 children.  She lives with her husband and kids.  She works in [employer], broadcasting.  She denies having to do any heavy lifting at her job.  She denies alcohol,  tobacco or recreational drug use.    Allergies:     No Known Allergies    Medications:     Current Outpatient Medications on File Prior to Visit   Medication Sig Dispense Refill   . drospirenone-ethinyl estradiol ([name]) 3-0.02 MG per tablet Take 1 tablet by mouth daily 84 tablet 1   . levothyroxine (SYNTHROID) 112 MCG tablet Take 112 mcg by mouth Once a day at 6:00am       No current facility-administered medications on file prior to visit.        Vital Signs:     Vitals:    07/25/18 1334   BP: 128/88   Pulse: 84   Resp: 12       Physical Exam:     General: No acute distress, cooperative with examination  Psychologic: Affect appropriate, judgment and insight consistent with situation, no delusions or hallucinations  Skin: Warm, dry, no obvious lesions or scars  Eyes: Sclerae anicteric, no conjunctival injection  ENT: No visible otorrhea, no rhinorrhea, trachea midline  Head: Normocephalic  Neck: No palpable masses  Musculoskeletal: Full ROM, normal muscle tone, no atrophy  Pulmonary: Normal respiratory effort, no audible wheezing  Cardiovascular: No pedal edema, pulses 2+ in bilat lower extremities  Abdominal: Non-tender to palpation, non-distended, no organomegaly, no palpable masses    Neuro exam:   Awake, alert, orientedx3  Speech clear and fluent  Attention span normal  PERRL, EOMI  Facial sensation intact  Face symmetric  Hearing intact  Tongue midline  Shoulder shrug strong bilaterally  Motor:   Arms:      Deltoid  Bicep Tricep Grip IO   Right 5 5 5 5 5    Left  5 5 5 5 5        Legs:      HF KE KF DF PF EHL   Right 5 5 5 5 5 5    Left  5 5 5 5 5 5      No pronator drift  No dysmetria  Light touch and pinprick intact in all 4 extremities.  DTRs:      Biceps Triceps Brachiorad  Patellar Ankle   Right 2+ 2+ 2+ 2+ 2+   Left  2+ 2+ 2+ 2+ 2+     No Hoffmann's sign bilat  No Clonus bilat  Gait normal  able to tandem gait  able to toe and heel walk    Negative straight leg raise bilaterally.  There is no tenderness to palpation in the bilateral SI joints.    Labs:     No results found for: WBC, HGB, HCT, MCV, PLT  No results found for: NA, K, CL, CO2  No results found for: INR, PT  No results found for: BUN  No results found for: CREAT      Imaging:     I reviewed the patient's imaging myself. My own interpretation of the MRI of the lumbar spine without contrast is that it shows grade 2 spondylolisthesis at L5-S1 resulting in bilateral foraminal stenosis, worse on the right side.  There is bilateral L5 pars defect.  There is degeneration and herniation of the L5-S1 disc.  There is no significant central stenosis.      Assessment:     38 y.o. female presenting with severe low back pain and right radicular leg pain.  Imaging reveals bilateral L5 pars defect resulting in the grade 2 spondylolisthesis at L5-S1, and associated bilateral foraminal stenosis worse on the right side.  On  examination, she is neurologically nonfocal.      Plan:     I had an extensive discussion with the patient regarding the condition. I showed her the imaging, and went over its findings in detail. I explained to the patient that her severe low back pain is most likely caused by her spondylolisthesis at L5-S1, which in turn is caused by her bilateral L5 pars defect.  I also told her that her right leg pain is caused by her right L5-S1 foraminal stenosis.  I explained to the patient that the only long-lasting option for treatment is fusion surgery.  I recommended L5-S1 anterior lumbar interbody fusion, followed by posterior spinal fusion.  I explained to the patient that this would achieve indirect decompression of her right L5 nerve root, which would help relieve her leg pain.  In addition, the fusion would relieve her low  back pain.    I explained to the patient the risks of surgical intervention, which include but are not limited to death, infection, bleeding, nerve injury, weakness, paresthesias, chronic pain, bowel/bladder dysfunction, blindness, CSF leak, pseudomeningocele formation, vascular injury, heart/lung complications from anesthesia, failure of surgery, failure of instrumentation, need for another surgery in the future. The patient voiced good understanding of these risks.  She will think of her options and will get back to [place] with her final decision.  All questions were answered.    Thank you for the opportunity of allowing me to participate in the care of Mrs. Ayjah Show.      Hoyle Sauer. Linder Prajapati, MD  Minimally Invasive and Complex Spine Surgery & General Neurosurgery  Department of Neurosciences  [facility]  Assistant Professor of Neurosurgery  [facility] Address: [address], [address]  [place], [place] [address]  Office Phone [phone]  Fax [phone]

## 2018-07-25 ENCOUNTER — Encounter (INDEPENDENT_AMBULATORY_CARE_PROVIDER_SITE_OTHER): Payer: Self-pay

## 2018-07-25 ENCOUNTER — Ambulatory Visit (INDEPENDENT_AMBULATORY_CARE_PROVIDER_SITE_OTHER): Payer: No Typology Code available for payment source | Admitting: Neurological Surgery

## 2018-07-25 ENCOUNTER — Encounter (INDEPENDENT_AMBULATORY_CARE_PROVIDER_SITE_OTHER): Payer: Self-pay | Admitting: Neurological Surgery

## 2018-07-25 VITALS — BP 128/88 | HR 84 | Resp 12

## 2018-07-25 DIAGNOSIS — M48061 Spinal stenosis, lumbar region without neurogenic claudication: Secondary | ICD-10-CM

## 2018-07-25 DIAGNOSIS — M5441 Lumbago with sciatica, right side: Secondary | ICD-10-CM

## 2018-07-25 DIAGNOSIS — M4316 Spondylolisthesis, lumbar region: Secondary | ICD-10-CM

## 2018-07-25 NOTE — Progress Notes (Signed)
A comprehensive review of systems was performed and revealed the following:    Constitutional: -negative for - fever, chills, sweating, fatigue, change in activity  General: negative for - recent weight gain, recent weight loss, appetite change, headaches  Eyes: negative for - blurry vision, double vision, eye pain, light sensitivity, eye discharge  ENT: negative for - hearing loss, nasal discharge, hoarseness, sore throat, swallowing difficulty  Heart: negative for - chest pain or tightness, palpitations, fainting, other heart trouble  Lungs: negative for - chronic cough, shortness of breath, wheezing, sleep apnea, hemoptysis  Gastrointestinal: negative for - abdominal pain, nausea, vomiting, diarrhea, constipation, bloody stool  Genito-Urinary: negative for - urinary retention, urinary incontinence, urinary urgency, urinary discharge  Musculoskeletal: negative for - joint swelling, joint pain, leg swelling, leg cramping, fractures  Neurological: negative for - dizziness, tremors, memory loss, speech difficulty, seizures  Hematological and Lymphatic: negative for - easy bleeding, easy bruising, swollen nodes  Skin: moles or skin lesions negative for - rash, enlarged glands, color change, infection  Psychaitric: negative for - anxiety, depression, confusion, excessive stress, suicidal thoughts    All other systems were reviewed by me and are negative.          Taken by: Reinaldo Meeker, LPN

## 2018-07-26 ENCOUNTER — Ambulatory Visit: Admission: RE | Admit: 2018-07-26 | Payer: Self-pay | Source: Ambulatory Visit

## 2018-07-26 ENCOUNTER — Other Ambulatory Visit: Payer: Self-pay

## 2018-07-26 ENCOUNTER — Ambulatory Visit
Admission: RE | Admit: 2018-07-26 | Discharge: 2018-07-26 | Disposition: A | Discharge status: Home / Self Care | Payer: Self-pay | Source: Ambulatory Visit

## 2018-07-28 ENCOUNTER — Other Ambulatory Visit: Payer: Self-pay | Admitting: Family Medicine

## 2018-07-29 ENCOUNTER — Encounter (INDEPENDENT_AMBULATORY_CARE_PROVIDER_SITE_OTHER): Payer: Self-pay | Admitting: Family Medicine

## 2018-07-29 ENCOUNTER — Ambulatory Visit (INDEPENDENT_AMBULATORY_CARE_PROVIDER_SITE_OTHER): Payer: No Typology Code available for payment source | Admitting: Family Medicine

## 2018-07-29 VITALS — BP 124/83 | HR 92 | Temp 97.7°F | Resp 12 | Wt 180.0 lb

## 2018-07-29 DIAGNOSIS — E039 Hypothyroidism, unspecified: Secondary | ICD-10-CM

## 2018-07-29 DIAGNOSIS — M5136 Other intervertebral disc degeneration, lumbar region: Secondary | ICD-10-CM

## 2018-07-29 NOTE — Progress Notes (Signed)
Have you seen any specialists/other providers since your last visit with us?    No    Arm preference verified?   Yes    The patient is due for pap smear

## 2018-07-29 NOTE — Progress Notes (Signed)
Subjective:      Date: 07/29/2018 7:49 PM   Patient ID: Peggy Kennedy is a 38 y.o. female.    Chief Complaint:  Chief Complaint   Patient presents with   . Back Pain     pt states sx continue and would like to discuss options    . Hypothyroidism     Rf on medications        HPI:  HPI  Patient presents in follow up to back pain-on review of an MRI dated 09/2017 she has spondylolisthesis at L5-S1 and Lumbar foraminal stenosis.   Since her last OV- she has been seen by pain management and had 4- 5 sessions however not had any significant symptom improvement. She has also been seen by Neurosurgery (Dr. Irving Burton) and surgery recommended-more specifically an  L5-S1 anterior lumbar interbody fusion, followed by posterior spinal fusion.   Patient still not sure how she would proceed- but will be talking this over with her husband    No problems updated.       Problem List:  Patient Active Problem List   Diagnosis   . Acquired hypothyroidism   . DDD (degenerative disc disease), lumbar       Current Medications:  Current Outpatient Medications   Medication Sig Dispense Refill   . drospirenone-ethinyl estradiol ([name]) 3-0.02 MG per tablet Take 1 tablet by mouth daily 84 tablet 1   . levothyroxine (SYNTHROID) 112 MCG tablet Take 1 tablet (112 mcg total) by mouth daily 90 tablet 1     No current facility-administered medications for this visit.        Allergies:  No Known Allergies    Past Medical History:  Past Medical History:   Diagnosis Date   . Back pain    . Hypothyroid        Past Surgical History:  Past Surgical History:   Procedure Laterality Date   . CESAREAN SECTION  02/2017   . csection  2014       Family History:  Family History   Problem Relation Age of Onset   . No known problems Mother    . No known problems Father        Social History:  Social History     Socioeconomic History   . Marital status: Unknown     Spouse name: Not on file   . Number of children: Not on file   . Years of education: Not on file   .  Highest education level: Not on file   Occupational History   . Not on file   Social Needs   . Financial resource strain: Not on file   . Food insecurity     Worry: Not on file     Inability: Not on file   . Transportation needs     Medical: Not on file     Non-medical: Not on file   Tobacco Use   . Smoking status: Never Smoker   . Smokeless tobacco: Never Used   Substance and Sexual Activity   . Alcohol use: Yes     Comment: occassionally   . Drug use: Never   . Sexual activity: Yes     Partners: Male     Birth control/protection: Pill     Comment: [name]   Lifestyle   . Physical activity     Days per week: Not on file     Minutes per session: Not on file   . Stress: Not on file  Relationships   . Social Wellsite geologist on phone: Not on file     Gets together: Not on file     Attends religious service: Not on file     Active member of club or organization: Not on file     Attends meetings of clubs or organizations: Not on file     Relationship status: Not on file   . Intimate partner violence     Fear of current or ex partner: Not on file     Emotionally abused: Not on file     Physically abused: Not on file     Forced sexual activity: Not on file   Other Topics Concern   . Not on file   Social History Narrative   . Not on file       The following sections were reviewed this encounter by the provider:   Tobacco  Allergies  Meds  Problems  Med Hx  Surg Hx  Fam Hx         ROS:  Review of Systems   Constitutional: Positive for fatigue. Negative for fever and unexpected weight change.   Gastrointestinal:        No fecal incontinence   Endocrine: Negative for cold intolerance and heat intolerance.   Genitourinary: Negative for menstrual problem.        No urinary incontinence   Neurological: Negative for weakness and headaches.   Psychiatric/Behavioral: Negative for sleep disturbance. The patient is not nervous/anxious.         Objective:     Vitals:  BP 124/83 (BP Site: Left arm, Patient Position: Sitting,  Cuff Size: Large)   Pulse 92   Temp 97.7 F (36.5 C) (Temporal)   Resp 12   Wt 81.6 kg (180 lb)   LMP 07/05/2018   BMI 27.37 kg/m     Physical Exam:  Physical Exam  Vitals signs and nursing note reviewed.   Constitutional:       General: She is not in acute distress.     Appearance: Normal appearance. She is not ill-appearing.   Neck:      Thyroid: No thyromegaly.   Pulmonary:      Effort: Pulmonary effort is normal. No respiratory distress.   Neurological:      Mental Status: She is alert and oriented to person, place, and time.      Comments: Reflexes are symmetrically depressed   Psychiatric:         Mood and Affect: Mood normal.         Behavior: Behavior normal.         Assessment/Plan:       1. DDD (degenerative disc disease), lumbar  Patient with chronic back pain with notable:  spondylolisthesis at L5-S1, degenerative cyst; L5-S1 foraminal stenosis with L5 impingement.  Surgery recommended per Dr. Irving Burton. Not having any perceived improvement with pain management intervention as of yet.    2. Acquired hypothyroidism  - levothyroxine (SYNTHROID) 112 MCG tablet; Take 1 tablet (112 mcg total) by mouth daily  Dispense: 90 tablet; Refill: 1  - Chronic controlled based on last testing  Lab Results   Component Value Date    TSH 2.45 07/13/2018         Return for CPE with FBW.        Philipp Ovens, MD

## 2018-07-30 MED ORDER — LEVOTHYROXINE SODIUM 112 MCG PO TABS
112.0000 ug | ORAL_TABLET | Freq: Every day | ORAL | 1 refills | Status: DC
Start: 2018-07-30 — End: 2019-02-14

## 2018-08-08 ENCOUNTER — Telehealth (INDEPENDENT_AMBULATORY_CARE_PROVIDER_SITE_OTHER): Payer: Self-pay

## 2018-08-08 NOTE — Telephone Encounter (Signed)
COVID-19 Ambulatory Phone Screening:    Are you currently experiencing fever, cough, or shortness of breath? No    If no to above: Are you currently experiencing chills, sore throat, new headache, loss of taste or smell, or body aches not attributable to physical activity? No

## 2018-08-09 ENCOUNTER — Encounter (INDEPENDENT_AMBULATORY_CARE_PROVIDER_SITE_OTHER): Payer: Self-pay | Admitting: Family Medicine

## 2018-08-09 ENCOUNTER — Ambulatory Visit (INDEPENDENT_AMBULATORY_CARE_PROVIDER_SITE_OTHER): Payer: No Typology Code available for payment source | Admitting: Family Medicine

## 2018-08-09 VITALS — BP 129/83 | HR 83 | Temp 97.7°F | Resp 12 | Ht 67.0 in | Wt 179.9 lb

## 2018-08-09 DIAGNOSIS — M5136 Other intervertebral disc degeneration, lumbar region: Secondary | ICD-10-CM

## 2018-08-09 DIAGNOSIS — Z808 Family history of malignant neoplasm of other organs or systems: Secondary | ICD-10-CM

## 2018-08-09 DIAGNOSIS — Z01419 Encounter for gynecological examination (general) (routine) without abnormal findings: Secondary | ICD-10-CM

## 2018-08-09 DIAGNOSIS — E039 Hypothyroidism, unspecified: Secondary | ICD-10-CM

## 2018-08-09 DIAGNOSIS — E282 Polycystic ovarian syndrome: Secondary | ICD-10-CM

## 2018-08-09 LAB — CBC AND DIFFERENTIAL
Absolute NRBC: 0 10*3/uL (ref 0.00–0.00)
Basophils Absolute Automated: 0.05 10*3/uL (ref 0.00–0.08)
Basophils Automated: 0.7 %
Eosinophils Absolute Automated: 0.07 10*3/uL (ref 0.00–0.44)
Eosinophils Automated: 0.9 %
Hematocrit: 42.6 % (ref 34.7–43.7)
Hgb: 13.6 g/dL (ref 11.4–14.8)
Immature Granulocytes Absolute: 0.02 10*3/uL (ref 0.00–0.07)
Immature Granulocytes: 0.3 %
Lymphocytes Absolute Automated: 1.82 10*3/uL (ref 0.42–3.22)
Lymphocytes Automated: 24.3 %
MCH: 28.2 pg (ref 25.1–33.5)
MCHC: 31.9 g/dL (ref 31.5–35.8)
MCV: 88.4 fL (ref 78.0–96.0)
MPV: 11.2 fL (ref 8.9–12.5)
Monocytes Absolute Automated: 0.38 10*3/uL (ref 0.21–0.85)
Monocytes: 5.1 %
Neutrophils Absolute: 5.16 10*3/uL (ref 1.10–6.33)
Neutrophils: 68.7 %
Nucleated RBC: 0 /100 WBC (ref 0.0–0.0)
Platelets: 220 10*3/uL (ref 142–346)
RBC: 4.82 10*6/uL (ref 3.90–5.10)
RDW: 13 % (ref 11–15)
WBC: 7.5 10*3/uL (ref 3.10–9.50)

## 2018-08-09 LAB — COMPREHENSIVE METABOLIC PANEL
ALT: 16 U/L (ref 0–55)
AST (SGOT): 18 U/L (ref 5–34)
Albumin/Globulin Ratio: 1.1 (ref 0.9–2.2)
Albumin: 3.7 g/dL (ref 3.5–5.0)
Alkaline Phosphatase: 47 U/L (ref 37–106)
Anion Gap: 12 (ref 5.0–15.0)
BUN: 12 mg/dL (ref 7.0–19.0)
Bilirubin, Total: 0.5 mg/dL (ref 0.2–1.2)
CO2: 21 mEq/L (ref 21–29)
Calcium: 8.8 mg/dL (ref 8.5–10.5)
Chloride: 106 mEq/L (ref 100–111)
Creatinine: 0.9 mg/dL (ref 0.4–1.5)
Globulin: 3.4 g/dL (ref 2.0–3.7)
Glucose: 82 mg/dL (ref 70–100)
Potassium: 4.5 mEq/L (ref 3.5–5.1)
Protein, Total: 7.1 g/dL (ref 6.0–8.3)
Sodium: 139 mEq/L (ref 136–145)

## 2018-08-09 LAB — LIPID PANEL
Cholesterol / HDL Ratio: 3.3
Cholesterol: 175 mg/dL (ref 0–199)
HDL: 53 mg/dL (ref 40–9999)
LDL Calculated: 97 mg/dL (ref 0–99)
Triglycerides: 127 mg/dL (ref 34–149)
VLDL Calculated: 25 mg/dL (ref 10–40)

## 2018-08-09 LAB — HEMOLYSIS INDEX: Hemolysis Index: 8 (ref 0–18)

## 2018-08-09 LAB — GFR: EGFR: >60

## 2018-08-09 NOTE — Patient Instructions (Signed)
Prevention Guidelines, Women Ages 18 to 39  Screening tests and vaccines are an important part of managing your health. A screening test is done to find possible disorders or diseases in people who don't have any symptoms. The goal is to find a disease early so lifestyle changes can be made and you can be watched more closely to reduce the risk of disease, or to detect it early enough to treat it most effectively. Screening tests are not considered diagnostic, but are used to determine if more testing is needed. Health counseling is essential, too. Below are guidelines for these, for women ages 18 to 39. Talk with your healthcare provider to make sure you’re up-to-date on what you need.  Screening Who needs it How often   Alcohol misuse All women in this age group At routine exams   Blood pressure All women in this age group Yearly checkup if your blood pressure is normal  Normal blood pressure is less than 120/80 mm Hg  If your blood pressure reading is higher than normal, follow the advice of your healthcare provider   Breast cancer All women in this age group should talk with their healthcare providers about the need for clinical breast exams (CBE)1 Clinical breast exam every 3 years1   Cervical cancer Women ages 21 and older Women between ages 21 and 29 should have a Pap test every 3 years; women between ages 30 and 65 are advised to have a Pap test plus an HPV test every 5 years   Chlamydia Sexually active women ages 25 and younger, and women at increased risk for infection (such as having multiple sex partners) Every year if you're at risk or have symptoms   Depression All women in this age group At routine exams   Type 2 diabetes, prediabetes All women with no symptoms who are overweight or obese and have 1 or more other risk factors for diabetes At least every 3 years. Also, testing for diabetes during pregnancy after the 24th week.    Type 2 diabetes, prediabetes All women diagnosed with gestational  diabetes Lifelong testing every 3 years   Type 2 diabetes All women with prediabetes Every year   Gonorrhea Sexually active women at increased risk for infection At routine exams   Hepatitis C Anyone at increased risk At routine exams   HIV All women should be tested at least once for HIV between the ages of 13 and 64 At routine exams. Those with risk factors for HIV should be tested at least annually.    Obesity All women in this age group At routine exams   Syphilis Women at increased risk for infection should talk with their healthcare provider At routine exams   Tuberculosis Women at increased risk for infection should talk with their healthcare provider Ask your healthcare provider   Vision All women in this age group At least 1 complete exam in your 20s, and 2 in your 30s   Vaccine2 Who needs it How often   Chickenpox (varicella) All women in this age group who have no record of this infection or vaccine 2 doses; the second dose should be given 4 to 8 weeks after the first dose   Hepatitis A Women at increased risk for infection should talk with their healthcare provider 2 doses given at least 6 months apart   Hepatitis B Women at increased risk for infection should talk with their healthcare provider 3 doses over 6 months; second dose should be given 1 month after   the first dose; the third dose should be given at least 2 months after the second dose and at least 4 months after the first dose   Haemophilus influenzae Type B (HIB) Women at increased risk for infection should talk with their healthcare provider 1 to 3 doses   Human papillomavirus (HPV) All women in this age group up to age 26 3 doses; the second dose should be given 1 to 2 months after the first dose and the third dose given 6 months after the first dose   Influenza (flu) All women in this age group Once a year   Measles, mumps, rubella (MMR) All women in this age group who have no record of these infections or vaccines 1 or 2 doses    Meningococcal Women at increased risk for infection should talk with their healthcare provider 1 or more doses   Pneumococcal conjugate vaccine (PCV13) and pneumococcal polysaccharide vaccine (PPSV23) Women at increased risk for infection should talk with their healthcare provider PCV13: 1 dose ages 19 to 65 (protects against 13 types of pneumococcal bacteria)  PPSV23: 1 to 2 doses through age 64, or 1 dose at 65 or older (protects against 23 types of pneumococcal bacteria)      Tetanus/diphtheria/pertussis (Td/Tdap) booster All women in this age group Td every 10 years, or a one-time dose of Tdap instead of a Td booster after age 18, then Td every 10 years   Counseling Who needs it How often   BRCA gene mutation testing for breast and ovarian cancer susceptibility Women with increased risk for having gene mutation When your risk is known   Breast cancer and chemoprevention Women at high risk for breast cancer When your risk is known   Diet and exercise Women who are overweight or obese When diagnosed, and then at routine exams   Domestic violence Women at the age in which they are able to have children At routine exams   Sexually transmitted infection prevention Women who are sexually active At routine exams   Skin cancer Prevention of skin cancer in fair-skinned adults At routine exams   Use of tobacco and the health effects it can cause All women in this age group Every visit   1 According to the ACS, women ages 20 to 39 years should have a clinical breast exam (CBE) as part of their routine health exam every 3 years. Breast self-exams are an option for women starting in their 20s. But the USPSTF does not recommend CBE.  [employer] last reviewed this educational content on 11/10/2015  © 2000-2020 [employer] [address], [place] [address]. All rights reserved. This information is not intended as a substitute for professional medical care. Always follow your healthcare professional's  instructions.

## 2018-08-09 NOTE — Progress Notes (Signed)
Subjective:      Date: 08/09/2018 10:42 AM   Patient ID: Peggy Kennedy is a 38 y.o. female.    Chief Complaint:  Chief Complaint   Patient presents with   . Annual Exam     pt is fasting and needs pap     HPI  Visit Type: Health Maintenance Visit    Current Concerns:    MRI dated 09/2017 she has spondylolisthesis at L5-S1 and Lumbar foraminal stenosis.  Neurosurgery (Dr. Irving Burton) and surgery recommended-more specifically an  L5-S1 anterior lumbar interbody fusion, followed by posterior spinal fusion.     History of hypothyroidism:    Lab Results   Component Value Date    TSH 2.45 07/13/2018    T4FREE 1.11 07/13/2018           Diet described as : tried to be healthy- decreasing sugars in the diet. No caffeine. Would benefit from increased water   Exercise reported as : None- currently as she has been having increased back pain  Dental: dentist visit within 6-12 months  Vision: regular eye exams       General Health Risks: no family history of colon cancer, family history of breast cancer-paternal aunt diagnosed in her 74's    Menstrual cycles:  Regular.  LMP 2 weeks prior  Last  Pap smear: 2 years prior - normal   History of STDs?No  Last Mammogram?  Likely had a mammogram- and normal- noted dense breast tissue  Any recent changes in breast examination?No  Any vaginal/pelvic complaints? Yes she has been having- ? Left sided ovarian pain- left sided suprapubic. not present today. LMP 2 weeks prior      Problem List:  Patient Active Problem List   Diagnosis   . Acquired hypothyroidism   . DDD (degenerative disc disease), lumbar   . PCOS (polycystic ovarian syndrome)       Current Medications:  Current Outpatient Medications   Medication Sig Dispense Refill   . drospirenone-ethinyl estradiol ([name]) 3-0.02 MG per tablet Take 1 tablet by mouth daily 84 tablet 1   . levothyroxine (SYNTHROID) 112 MCG tablet Take 1 tablet (112 mcg total) by mouth daily 90 tablet 1     No current facility-administered medications for this  visit.        Allergies:  No Known Allergies    Past Medical History:  Past Medical History:   Diagnosis Date   . Back pain    . Hypothyroid        Past Surgical History:  Past Surgical History:   Procedure Laterality Date   . CESAREAN SECTION  02/2017   . csection  2014       Family History:  Family History   Problem Relation Age of Onset   . No known problems Mother    . No known problems Father        Social History:  Social History     Socioeconomic History   . Marital status: Unknown     Spouse name: Not on file   . Number of children: Not on file   . Years of education: Not on file   . Highest education level: Not on file   Occupational History   . Not on file   Social Needs   . Financial resource strain: Not on file   . Food insecurity     Worry: Not on file     Inability: Not on file   . Transportation needs  Medical: Not on file     Non-medical: Not on file   Tobacco Use   . Smoking status: Never Smoker   . Smokeless tobacco: Never Used   Substance and Sexual Activity   . Alcohol use: Yes     Comment: occassionally   . Drug use: Never   . Sexual activity: Yes     Partners: Male     Birth control/protection: Pill     Comment: [name]   Lifestyle   . Physical activity     Days per week: Not on file     Minutes per session: Not on file   . Stress: Not on file   Relationships   . Social Wellsite geologist on phone: Not on file     Gets together: Not on file     Attends religious service: Not on file     Active member of club or organization: Not on file     Attends meetings of clubs or organizations: Not on file     Relationship status: Not on file   . Intimate partner violence     Fear of current or ex partner: Not on file     Emotionally abused: Not on file     Physically abused: Not on file     Forced sexual activity: Not on file   Other Topics Concern   . Not on file   Social History Narrative   . Not on file       The following sections were reviewed this encounter by the provider:   Tobacco   Allergies  Meds  Problems  Med Hx  Surg Hx  Fam Hx         Review of Systems   Constitutional: Negative for activity change, appetite change, chills, fatigue, fever and unexpected weight change.   HENT: Negative for postnasal drip, sinus pressure, sore throat and voice change.    Eyes: Negative for photophobia and visual disturbance.   Respiratory: Negative for chest tightness and shortness of breath.    Cardiovascular: Negative for chest pain and palpitations.   Gastrointestinal: Positive for constipation (chronic). Negative for abdominal distention, abdominal pain and diarrhea.   Endocrine: Negative for cold intolerance and heat intolerance.   Genitourinary: Negative for difficulty urinating, dyspareunia, dysuria, menstrual problem, pelvic pain, vaginal bleeding and vaginal discharge.   Musculoskeletal: Negative for arthralgias.   Skin: Negative for color change and rash.   Neurological: Negative for dizziness, weakness, light-headedness and headaches.   Hematological: Negative for adenopathy.   Psychiatric/Behavioral: Negative for agitation, confusion, decreased concentration and sleep disturbance.         Objective:     Vitals:  BP 129/83 (BP Site: Left arm, Patient Position: Sitting, Cuff Size: Large)   Pulse 83   Temp 97.7 F (36.5 C) (Temporal)   Resp 12   Ht 1.702 m (5\' 7" )   Wt 81.6 kg (179 lb 14.4 oz)   LMP 07/27/2018   BMI 28.18 kg/m     Physical Exam  Vitals signs and nursing note reviewed.   Constitutional:       General: She is not in acute distress.     Appearance: She is well-developed.   HENT:      Mouth/Throat:      Mouth: Mucous membranes are moist.      Pharynx: No oropharyngeal exudate.   Eyes:      Pupils: Pupils are equal, round, and reactive to light.  Neck:      Musculoskeletal: Normal range of motion and neck supple.      Thyroid: No thyromegaly.   Cardiovascular:      Rate and Rhythm: Normal rate and regular rhythm.      Heart sounds: Normal heart sounds.   Pulmonary:       Effort: Pulmonary effort is normal. No respiratory distress.      Breath sounds: Normal breath sounds.   Chest:      Breasts:         Right: No nipple discharge or skin change.         Left: No nipple discharge or skin change.      Comments: No fixed masses  Abdominal:      General: There is no distension.      Palpations: Abdomen is soft.      Tenderness: There is no abdominal tenderness.   Genitourinary:     Vagina: Normal.      Cervix: No cervical motion tenderness.      Adnexa:         Right: No mass, tenderness or fullness.          Left: No mass, tenderness or fullness.     Musculoskeletal: Normal range of motion.         General: No deformity.   Lymphadenopathy:      Upper Body:      Right upper body: No axillary adenopathy.      Left upper body: No axillary adenopathy.   Skin:     General: Skin is warm and dry.      Comments: Benign appearing Sk's and  Hyperpigmented lesions   Neurological:      Mental Status: She is alert and oriented to person, place, and time.      Cranial Nerves: No cranial nerve deficit.      Sensory: No sensory deficit.         Assessment/Plan:       1. Well woman exam with routine gynecological exam  - CBC and differential  - Comprehensive metabolic panel  - Lipid panel  - Pap Smear, Thin Prep Method w/ HR HPV    2. DDD (degenerative disc disease), lumbar  RI dated 09/2017 she has spondylolisthesis at L5-S1 and Lumbar foraminal stenosis.  Neurosurgery (Dr. Irving Burton) and surgery recommended-more specifically an  L5-S1 anterior lumbar interbody fusion, followed by posterior spinal fusion.   - patient at this time will pursue a course of PT and reassess    3. Acquired hypothyroidism  Chronic/controlled    4. PCOS (polycystic ovarian syndrome)    5. Family history of skin cancer  - Ambulatory referral to Dermatology       Health Maintenance:      High BMI follow-up: BMI Follow Up Care Plan Documented.   Nutrition and Physical Activity Counseling:  Encouraged to Exercise  Food education,  guidance and counseling provided      Recommend optimizing low carbohydrate diet efforts and obtaining at least 150 minutes of aerobic exercise per week.   Recommend 20-25 grams of dietary fiber daily.   Recommend drinking at least 60-80 ounces of water per day.   Recommend optimizing low sodium diet measures ( less than 2 grams of sodium in the diet per day ).       Return in about 6 months (around 02/08/2019). sooner as needed    Philipp Ovens, MD

## 2018-08-09 NOTE — Progress Notes (Signed)
Have you seen any specialists/other providers since your last visit with us?    No    Arm preference verified?   Yes    The patient is due for nothing at this time, HM is up-to-date.

## 2018-08-15 ENCOUNTER — Ambulatory Visit (INDEPENDENT_AMBULATORY_CARE_PROVIDER_SITE_OTHER): Payer: No Typology Code available for payment source | Admitting: Neurological Surgery

## 2018-08-15 LAB — PAP SMEAR, THIN PREP WITH HR HPV: HPV DNA, high risk: NOT DETECTED

## 2018-11-11 ENCOUNTER — Encounter (INDEPENDENT_AMBULATORY_CARE_PROVIDER_SITE_OTHER): Payer: Self-pay | Admitting: Family Medicine

## 2018-11-11 ENCOUNTER — Ambulatory Visit (INDEPENDENT_AMBULATORY_CARE_PROVIDER_SITE_OTHER): Payer: No Typology Code available for payment source | Admitting: Family Medicine

## 2018-11-11 VITALS — BP 128/86 | HR 79 | Temp 97.7°F | Ht 68.0 in | Wt 158.4 lb

## 2018-11-11 DIAGNOSIS — R5383 Other fatigue: Secondary | ICD-10-CM

## 2018-11-11 DIAGNOSIS — R200 Anesthesia of skin: Secondary | ICD-10-CM

## 2018-11-11 DIAGNOSIS — R202 Paresthesia of skin: Secondary | ICD-10-CM

## 2018-11-11 DIAGNOSIS — Z23 Encounter for immunization: Secondary | ICD-10-CM

## 2018-11-11 LAB — CBC AND DIFFERENTIAL
Absolute NRBC: 0 10*3/uL (ref 0.00–0.00)
Basophils Absolute Automated: 0.03 10*3/uL (ref 0.00–0.08)
Basophils Automated: 0.4 %
Eosinophils Absolute Automated: 0.07 10*3/uL (ref 0.00–0.44)
Eosinophils Automated: 0.9 %
Hematocrit: 38.5 % (ref 34.7–43.7)
Hgb: 12.6 g/dL (ref 11.4–14.8)
Immature Granulocytes Absolute: 0.01 10*3/uL (ref 0.00–0.07)
Immature Granulocytes: 0.1 %
Lymphocytes Absolute Automated: 2.54 10*3/uL (ref 0.42–3.22)
Lymphocytes Automated: 31.3 %
MCH: 28.4 pg (ref 25.1–33.5)
MCHC: 32.7 g/dL (ref 31.5–35.8)
MCV: 86.9 fL (ref 78.0–96.0)
MPV: 11.2 fL (ref 8.9–12.5)
Monocytes Absolute Automated: 0.43 10*3/uL (ref 0.21–0.85)
Monocytes: 5.3 %
Neutrophils Absolute: 5.03 10*3/uL (ref 1.10–6.33)
Neutrophils: 62 %
Nucleated RBC: 0 /100 WBC (ref 0.0–0.0)
Platelets: 214 10*3/uL (ref 142–346)
RBC: 4.43 10*6/uL (ref 3.90–5.10)
RDW: 13 % (ref 11–15)
WBC: 8.11 10*3/uL (ref 3.10–9.50)

## 2018-11-11 LAB — TSH: TSH: 1.53 u[IU]/mL (ref 0.35–4.94)

## 2018-11-11 LAB — VITAMIN B12: Vitamin B-12: 341 pg/mL (ref 211–911)

## 2018-11-11 LAB — T3, FREE: T3, Free: 3 pg/mL (ref 1.71–3.71)

## 2018-11-11 LAB — HEMOLYSIS INDEX: Hemolysis Index: 7 (ref 0–18)

## 2018-11-11 LAB — T4, FREE: T4 Free: 1.19 ng/dL (ref 0.70–1.48)

## 2018-11-11 NOTE — Progress Notes (Deleted)
Subjective:      Date: 11/11/2018 2:35 PM   Patient ID: Peggy Kennedy is a 38 y.o. female.    Chief Complaint:  Chief Complaint   Patient presents with   . Fatigue   . Numbness     Fingers and hands and toes and both legs below knees for a month       HPI:  HPI    Increased fatigue as of late- she has   She has some tingling /numbness in the fingers and toes- can increase to the x 1 month    ***  No problems updated.      Problem List:  Patient Active Problem List   Diagnosis   . Acquired hypothyroidism   . DDD (degenerative disc disease), lumbar   . PCOS (polycystic ovarian syndrome)       Current Medications:  Current Outpatient Medications   Medication Sig Dispense Refill   . drospirenone-ethinyl estradiol ([name]) 3-0.02 MG per tablet Take 1 tablet by mouth daily 84 tablet 1   . levothyroxine (SYNTHROID) 112 MCG tablet Take 1 tablet (112 mcg total) by mouth daily 90 tablet 1     No current facility-administered medications for this visit.        Allergies:  No Known Allergies    Past Medical History:  Past Medical History:   Diagnosis Date   . Back pain    . Hypothyroid        Past Surgical History:  Past Surgical History:   Procedure Laterality Date   . CESAREAN SECTION  02/2017   . csection  2014       Family History:  Family History   Problem Relation Age of Onset   . No known problems Mother    . No known problems Father        Social History:  Social History     Socioeconomic History   . Marital status: Unknown     Spouse name: Not on file   . Number of children: Not on file   . Years of education: Not on file   . Highest education level: Not on file   Occupational History   . Not on file   Social Needs   . Financial resource strain: Not on file   . Food insecurity     Worry: Not on file     Inability: Not on file   . Transportation needs     Medical: Not on file     Non-medical: Not on file   Tobacco Use   . Smoking status: Never Smoker   . Smokeless tobacco: Never Used   Substance and Sexual Activity    . Alcohol use: Yes     Comment: occassionally   . Drug use: Never   . Sexual activity: Yes     Partners: Male     Birth control/protection: Pill     Comment: [name]   Lifestyle   . Physical activity     Days per week: Not on file     Minutes per session: Not on file   . Stress: Not on file   Relationships   . Social Wellsite geologist on phone: Not on file     Gets together: Not on file     Attends religious service: Not on file     Active member of club or organization: Not on file     Attends meetings of clubs or organizations: Not on file  Relationship status: Not on file   . Intimate partner violence     Fear of current or ex partner: Not on file     Emotionally abused: Not on file     Physically abused: Not on file     Forced sexual activity: Not on file   Other Topics Concern   . Not on file   Social History Narrative   . Not on file       The following sections were reviewed this encounter by the provider:   Tobacco  Allergies  Meds  Problems  Med Hx  Surg Hx  Fam Hx         ROS:  Review of Systems     Objective:     Vitals:  BP 128/86   Pulse 79   Temp 97.7 F (36.5 C)   Ht 1.727 m (5\' 8" )   Wt 71.8 kg (158 lb 6.4 oz)   LMP 10/25/2018 (Approximate)   SpO2 99%   BMI 24.08 kg/m     Physical Exam:  Physical Exam    Assessment/Plan:       1. Need for vaccination  - Flu Vaccine Quadrivalent 6 months & up PRESERVATIVE FREE (Afluria / Fluarix / Flulaval / Fluzone)    ***      Medications and dosing intervals reviewed at this visit    No follow-ups on file.    Recommend Shingrix vaccine- available at local pharmacies    Follow up for persistent or worsening symptoms.        Philipp Ovens, MD

## 2018-11-11 NOTE — Progress Notes (Signed)
Have you seen any specialists/other providers since your last visit with us?      Yes. Dermatologist      Arm preference verified?     Yes    The patient is due for nothing at this time, HM is up-to-date.

## 2018-11-11 NOTE — Progress Notes (Signed)
Subjective:      Date: 11/11/2018 8:16 PM   Patient ID: Peggy Kennedy is a 38 y.o. female.    Chief Complaint:  Chief Complaint   Patient presents with   . Fatigue   . Numbness     Fingers and hands and toes and both legs below knees for a month       HPI:  HPI   patient presents for evaluation of   1.  Requests thyroid levels rechecked as she has  increased fatigue as of late.  2.  She has known history of degenerative disc disease however has had some tingling /numbness in the fingers and toes x 1 month.  No clear inciting nor exacerbating factors.  No clear  correlation with positional changes.  No new medications.  No similar presentation in the past.  No fecal nor urinary incontinence.      No problems updated.      Problem List:  Patient Active Problem List   Diagnosis   . Acquired hypothyroidism   . DDD (degenerative disc disease), lumbar   . PCOS (polycystic ovarian syndrome)       Current Medications:  Current Outpatient Medications   Medication Sig Dispense Refill   . drospirenone-ethinyl estradiol ([name]) 3-0.02 MG per tablet Take 1 tablet by mouth daily 84 tablet 1   . levothyroxine (SYNTHROID) 112 MCG tablet Take 1 tablet (112 mcg total) by mouth daily 90 tablet 1     No current facility-administered medications for this visit.        Allergies:  No Known Allergies    Past Medical History:  Past Medical History:   Diagnosis Date   . Back pain    . Hypothyroid        Past Surgical History:  Past Surgical History:   Procedure Laterality Date   . CESAREAN SECTION  02/2017   . csection  2014       Family History:  Family History   Problem Relation Age of Onset   . No known problems Mother    . No known problems Father        Social History:  Social History     Socioeconomic History   . Marital status: Unknown     Spouse name: Not on file   . Number of children: Not on file   . Years of education: Not on file   . Highest education level: Not on file   Occupational History   . Not on file   Social Needs    . Financial resource strain: Not on file   . Food insecurity     Worry: Not on file     Inability: Not on file   . Transportation needs     Medical: Not on file     Non-medical: Not on file   Tobacco Use   . Smoking status: Never Smoker   . Smokeless tobacco: Never Used   Substance and Sexual Activity   . Alcohol use: Yes     Comment: occassionally   . Drug use: Never   . Sexual activity: Yes     Partners: Male     Birth control/protection: Pill     Comment: [name]   Lifestyle   . Physical activity     Days per week: Not on file     Minutes per session: Not on file   . Stress: Not on file   Relationships   . Social connections  Talks on phone: Not on file     Gets together: Not on file     Attends religious service: Not on file     Active member of club or organization: Not on file     Attends meetings of clubs or organizations: Not on file     Relationship status: Not on file   . Intimate partner violence     Fear of current or ex partner: Not on file     Emotionally abused: Not on file     Physically abused: Not on file     Forced sexual activity: Not on file   Other Topics Concern   . Not on file   Social History Narrative   . Not on file       The following sections were reviewed this encounter by the provider:   Tobacco  Allergies  Meds  Problems  Med Hx  Surg Hx  Fam Hx         ROS:  Review of Systems   Constitutional: Positive for fatigue. Negative for fever and unexpected weight change.   Gastrointestinal:        No fecal incontinence   Endocrine: Negative for cold intolerance and heat intolerance.   Genitourinary: Negative for menstrual problem.        No urinary incontinence   Neurological: Negative for weakness and headaches.        Paresthesias as per HPI   Psychiatric/Behavioral: Negative for sleep disturbance. The patient is not nervous/anxious.         Objective:     Vitals:  BP 128/86   Pulse 79   Temp 97.7 F (36.5 C)   Ht 1.727 m (5\' 8" )   Wt 71.8 kg (158 lb 6.4 oz)   LMP  10/25/2018 (Approximate)   SpO2 99%   BMI 24.08 kg/m     Physical Exam:  Physical Exam  Vitals signs and nursing note reviewed.   Constitutional:       General: She is not in acute distress.     Appearance: Normal appearance. She is not ill-appearing.   Neck:      Thyroid: No thyromegaly.   Pulmonary:      Effort: Pulmonary effort is normal. No respiratory distress.   Skin:     General: Skin is warm and dry.   Neurological:      Mental Status: She is alert and oriented to person, place, and time.      Comments: Reflexes are symmetrically depressed   Psychiatric:         Mood and Affect: Mood normal.         Behavior: Behavior normal.       Assessment/Plan:       1. Fatigue, unspecified type   TSH  - T3, free  - T4, free  - Vitamin B12  - CBC and differential  2. Numbness and tingling in both hands  - TSH  - T3, free  - T4, free  - Vitamin B12  - CBC and differential    3. Numbness of toes  - TSH  - T3, free  - T4, free  - Vitamin B12  - CBC and differential    4. Need for vaccination  - Flu Vaccine Quadrivalent 6 months & up PRESERVATIVE FREE (Afluria / Fluarix / Flulaval / Fluzone)    Testing as indicated.  Follow up to be determined by test results, follow-up sooner for worsening symptoms.  Kenard Gower, MD

## 2018-11-21 ENCOUNTER — Encounter (INDEPENDENT_AMBULATORY_CARE_PROVIDER_SITE_OTHER): Payer: Self-pay | Admitting: Family Medicine

## 2018-11-23 ENCOUNTER — Other Ambulatory Visit (INDEPENDENT_AMBULATORY_CARE_PROVIDER_SITE_OTHER): Payer: Self-pay | Admitting: Family Medicine

## 2018-11-23 DIAGNOSIS — R202 Paresthesia of skin: Secondary | ICD-10-CM

## 2018-11-23 DIAGNOSIS — R200 Anesthesia of skin: Secondary | ICD-10-CM

## 2019-02-12 ENCOUNTER — Other Ambulatory Visit (INDEPENDENT_AMBULATORY_CARE_PROVIDER_SITE_OTHER): Payer: Self-pay | Admitting: Family Medicine

## 2019-02-12 DIAGNOSIS — E039 Hypothyroidism, unspecified: Secondary | ICD-10-CM

## 2019-02-20 ENCOUNTER — Other Ambulatory Visit: Payer: Self-pay | Admitting: Medical

## 2019-02-20 DIAGNOSIS — Z1231 Encounter for screening mammogram for malignant neoplasm of breast: Secondary | ICD-10-CM

## 2019-02-21 ENCOUNTER — Other Ambulatory Visit: Payer: Self-pay | Admitting: Medical

## 2019-02-21 ENCOUNTER — Ambulatory Visit: Payer: No Typology Code available for payment source | Attending: Medical

## 2019-02-21 DIAGNOSIS — Z1231 Encounter for screening mammogram for malignant neoplasm of breast: Secondary | ICD-10-CM | POA: Insufficient documentation

## 2019-02-22 ENCOUNTER — Other Ambulatory Visit (INDEPENDENT_AMBULATORY_CARE_PROVIDER_SITE_OTHER): Payer: Self-pay | Admitting: Family Medicine

## 2019-02-24 ENCOUNTER — Encounter (INDEPENDENT_AMBULATORY_CARE_PROVIDER_SITE_OTHER): Payer: Self-pay | Admitting: Family Medicine

## 2019-02-24 NOTE — Progress Notes (Signed)
Last filled May 2020.   Last o/v October 2020.  Pt does not have an upcoming appointment. .  Queued up 90 day supply with 0 refills.

## 2019-02-25 MED ORDER — DROSPIRENONE-ETHINYL ESTRADIOL 3-0.02 MG PO TABS
1.0000 | ORAL_TABLET | Freq: Every day | ORAL | 1 refills | Status: DC
Start: 2019-02-25 — End: 2021-05-06

## 2019-02-26 ENCOUNTER — Encounter (INDEPENDENT_AMBULATORY_CARE_PROVIDER_SITE_OTHER): Payer: Self-pay | Admitting: Family Medicine

## 2019-03-23 ENCOUNTER — Encounter (INDEPENDENT_AMBULATORY_CARE_PROVIDER_SITE_OTHER): Payer: Self-pay | Admitting: Family Medicine

## 2019-03-23 NOTE — Progress Notes (Signed)
Please assist with scheduling appointment.  Advise that cannot guarantee turn around time for testing

## 2019-03-24 NOTE — Progress Notes (Signed)
Pt informed that she will need to go to [facility] per Duwayne Heck we don't test travel pt's currently.   Pt v/u.

## 2019-03-24 NOTE — Progress Notes (Signed)
Please see prior message.   

## 2019-03-30 ENCOUNTER — Encounter (INDEPENDENT_AMBULATORY_CARE_PROVIDER_SITE_OTHER): Payer: Self-pay | Admitting: Family Medicine

## 2019-03-31 ENCOUNTER — Telehealth (INDEPENDENT_AMBULATORY_CARE_PROVIDER_SITE_OTHER): Payer: No Typology Code available for payment source | Admitting: Family Medicine

## 2019-03-31 ENCOUNTER — Encounter (INDEPENDENT_AMBULATORY_CARE_PROVIDER_SITE_OTHER): Payer: Self-pay | Admitting: Family Medicine

## 2019-03-31 DIAGNOSIS — Z20822 Contact with and (suspected) exposure to covid-19: Secondary | ICD-10-CM

## 2019-03-31 NOTE — Progress Notes (Signed)
Have you seen any specialists/other providers since your last visit with us?    No    Arm preference verified?   Yes    The patient is due for nothing at this time, HM is up-to-date.

## 2019-03-31 NOTE — Progress Notes (Signed)
Subjective:      Date: 03/31/2019 2:47 PM   Patient ID: Peggy Kennedy is a 38 y.o. female.    Chief Complaint:  Chief Complaint   Patient presents with   . covid testing order     Pt needs an order for travel covid testing. Pt needs a RT-PCR test.        HPI:  In response to efforts to limit exposure to COVID19- patient has verbally consented to this telemedicine encounter for evaluation and management.    HPI   Patient presents for an order for COVID 19 screening. She has contacted an outside agency that does PCR testing for travel needs. She intends on travelling to [place] in 1 week - anticipates turn around time 48 hours   She has had no symptoms - no fevers, chills, cough, change in taste nor smell.      No problems updated.      Problem List:  Patient Active Problem List   Diagnosis   . Acquired hypothyroidism   . DDD (degenerative disc disease), lumbar   . PCOS (polycystic ovarian syndrome)       Current Medications:  Current Outpatient Medications   Medication Sig Dispense Refill   . drospirenone-ethinyl estradiol ([name]) 3-0.02 MG per tablet Take 1 tablet by mouth daily 84 tablet 1   . levothyroxine (SYNTHROID) 112 MCG tablet TAKE 1 TABLET BY MOUTH EVERY DAY 90 tablet 1     No current facility-administered medications for this visit.        Allergies:  No Known Allergies    Past Medical History:  Past Medical History:   Diagnosis Date   . Back pain    . Hypothyroid        Past Surgical History:  Past Surgical History:   Procedure Laterality Date   . CESAREAN SECTION  02/2017   . csection  2014       Family History:  Family History   Problem Relation Age of Onset   . No known problems Mother    . No known problems Father    . Breast cancer Paternal Aunt        Social History:  Social History     Socioeconomic History   . Marital status: Married     Spouse name: Not on file   . Number of children: Not on file   . Years of education: Not on file   . Highest education level: Not on file    Occupational History   . Not on file   Social Needs   . Financial resource strain: Not on file   . Food insecurity     Worry: Not on file     Inability: Not on file   . Transportation needs     Medical: Not on file     Non-medical: Not on file   Tobacco Use   . Smoking status: Never Smoker   . Smokeless tobacco: Never Used   Substance and Sexual Activity   . Alcohol use: Yes     Comment: occassionally   . Drug use: Never   . Sexual activity: Yes     Partners: Male     Birth control/protection: Pill     Comment: [name]   Lifestyle   . Physical activity     Days per week: Not on file     Minutes per session: Not on file   . Stress: Not on file   Relationships   .  Social Wellsite geologist on phone: Not on file     Gets together: Not on file     Attends religious service: Not on file     Active member of club or organization: Not on file     Attends meetings of clubs or organizations: Not on file     Relationship status: Not on file   . Intimate partner violence     Fear of current or ex partner: Not on file     Emotionally abused: Not on file     Physically abused: Not on file     Forced sexual activity: Not on file   Other Topics Concern   . Not on file   Social History Narrative   . Not on file       The following sections were reviewed this encounter by the provider:   Tobacco  Allergies  Meds  Problems  Med Hx  Surg Hx  Fam Hx         ROS:  Review of Systems   Constitutional: Negative for chills, fatigue and fever.   HENT: Negative for congestion.    Respiratory: Negative for cough and shortness of breath.    Cardiovascular: Negative for chest pain.   Neurological: Negative for headaches.        Objective:     Vitals:  There were no vitals taken for this visit.    Physical Exam:  Physical Exam  Nursing note reviewed.   Constitutional:       General: She is not in acute distress.     Appearance: Normal appearance. She is not ill-appearing.   Pulmonary:      Effort: Pulmonary effort is normal. No  respiratory distress.   Neurological:      Mental Status: She is alert.   Psychiatric:         Mood and Affect: Mood normal.         Behavior: Behavior normal.         Assessment/Plan:       1. Encounter for screening laboratory testing for COVID-19 virus  - COVID-19 ASYMPTOMATIC Patient with Provider Order; Future    Testing ordered as indicated.  Results will be directed to patient and the office in 48 hours      Follow up as needed    Time spent in medical discussion and in co-ordination of care  5 to 10 minutes      Philipp Ovens, MD

## 2019-06-05 ENCOUNTER — Other Ambulatory Visit (INDEPENDENT_AMBULATORY_CARE_PROVIDER_SITE_OTHER): Payer: Self-pay | Admitting: Family Medicine

## 2019-08-08 ENCOUNTER — Other Ambulatory Visit (INDEPENDENT_AMBULATORY_CARE_PROVIDER_SITE_OTHER): Payer: Self-pay | Admitting: Family Medicine

## 2019-08-08 DIAGNOSIS — E039 Hypothyroidism, unspecified: Secondary | ICD-10-CM

## 2019-09-14 ENCOUNTER — Other Ambulatory Visit (INDEPENDENT_AMBULATORY_CARE_PROVIDER_SITE_OTHER): Payer: Self-pay | Admitting: Family Medicine

## 2019-11-05 ENCOUNTER — Other Ambulatory Visit (INDEPENDENT_AMBULATORY_CARE_PROVIDER_SITE_OTHER): Payer: Self-pay | Admitting: Family Medicine

## 2019-11-05 DIAGNOSIS — E039 Hypothyroidism, unspecified: Secondary | ICD-10-CM

## 2019-11-06 NOTE — Telephone Encounter (Signed)
Call patient- medication refilled, but it is time for her follow up visit. Please assist with scheduling

## 2019-12-05 ENCOUNTER — Ambulatory Visit (INDEPENDENT_AMBULATORY_CARE_PROVIDER_SITE_OTHER): Payer: No Typology Code available for payment source | Admitting: Internal Medicine

## 2019-12-05 ENCOUNTER — Encounter (INDEPENDENT_AMBULATORY_CARE_PROVIDER_SITE_OTHER): Payer: Self-pay | Admitting: Internal Medicine

## 2019-12-05 VITALS — BP 115/79 | HR 73 | Wt 147.0 lb

## 2019-12-05 DIAGNOSIS — R1031 Right lower quadrant pain: Secondary | ICD-10-CM

## 2019-12-05 DIAGNOSIS — Z136 Encounter for screening for cardiovascular disorders: Secondary | ICD-10-CM

## 2019-12-05 DIAGNOSIS — E559 Vitamin D deficiency, unspecified: Secondary | ICD-10-CM

## 2019-12-05 DIAGNOSIS — R221 Localized swelling, mass and lump, neck: Secondary | ICD-10-CM

## 2019-12-05 DIAGNOSIS — R5382 Chronic fatigue, unspecified: Secondary | ICD-10-CM

## 2019-12-05 DIAGNOSIS — Z13220 Encounter for screening for lipoid disorders: Secondary | ICD-10-CM

## 2019-12-05 DIAGNOSIS — E039 Hypothyroidism, unspecified: Secondary | ICD-10-CM

## 2019-12-05 LAB — COMPREHENSIVE METABOLIC PANEL
ALT: 17 U/L (ref 0–55)
AST (SGOT): 16 U/L (ref 5–34)
Albumin/Globulin Ratio: 1.1 (ref 0.9–2.2)
Albumin: 3.5 g/dL (ref 3.5–5.0)
Alkaline Phosphatase: 52 U/L (ref 37–117)
Anion Gap: 9 (ref 5.0–15.0)
BUN: 9 mg/dL (ref 7.0–19.0)
Bilirubin, Total: 0.4 mg/dL (ref 0.2–1.2)
CO2: 25 mEq/L (ref 21–29)
Calcium: 9.1 mg/dL (ref 8.5–10.5)
Chloride: 106 mEq/L (ref 100–111)
Creatinine: 0.8 mg/dL (ref 0.4–1.5)
Globulin: 3.1 g/dL (ref 2.0–3.7)
Glucose: 76 mg/dL (ref 70–100)
Potassium: 4.6 mEq/L (ref 3.5–5.1)
Protein, Total: 6.6 g/dL (ref 6.0–8.3)
Sodium: 140 mEq/L (ref 136–145)

## 2019-12-05 LAB — CBC
Absolute NRBC: 0 10*3/uL (ref 0.00–0.00)
Hematocrit: 40.1 % (ref 34.7–43.7)
Hgb: 12.9 g/dL (ref 11.4–14.8)
MCH: 29.1 pg (ref 25.1–33.5)
MCHC: 32.2 g/dL (ref 31.5–35.8)
MCV: 90.3 fL (ref 78.0–96.0)
MPV: 11.4 fL (ref 8.9–12.5)
Nucleated RBC: 0 /100 WBC (ref 0.0–0.0)
Platelets: 184 10*3/uL (ref 142–346)
RBC: 4.44 10*6/uL (ref 3.90–5.10)
RDW: 12 % (ref 11–15)
WBC: 5.78 10*3/uL (ref 3.10–9.50)

## 2019-12-05 LAB — POCT PREGNANCY TEST, URINE HCG: POCT Pregnancy HCG Test, UR: NEGATIVE

## 2019-12-05 LAB — POCT URINALYSIS DIPSTIX (10)(MULTI-TEST)
Bilirubin, UA POCT: NEGATIVE
Blood, UA POCT: NEGATIVE
Glucose, UA POCT: NEGATIVE mg/dL
Ketones, UA POCT: NEGATIVE mg/dL
Nitrite, UA POCT: NEGATIVE
POCT Leukocytes, UA: NEGATIVE
POCT Spec Gravity, UA: 1.03 (ref 1.001–1.035)
POCT pH, UA: 6 (ref 5–8)
Protein, UA POCT: NEGATIVE mg/dL
Urobilinogen, UA: 0.2 mg/dL

## 2019-12-05 LAB — HEMOLYSIS INDEX: Hemolysis Index: 2 (ref 0–24)

## 2019-12-05 LAB — IRON PROFILE
Iron Saturation: 23 % (ref 15–50)
Iron: 88 ug/dL (ref 40–145)
TIBC: 384 ug/dL (ref 265–497)
UIBC: 296 ug/dL (ref 126–382)

## 2019-12-05 LAB — LIPID PANEL
Cholesterol / HDL Ratio: 2.8
Cholesterol: 149 mg/dL (ref 0–199)
HDL: 53 mg/dL (ref 40–9999)
LDL Calculated: 79 mg/dL (ref 0–99)
Triglycerides: 85 mg/dL (ref 34–149)
VLDL Calculated: 17 mg/dL (ref 10–40)

## 2019-12-05 LAB — TSH: TSH: 0.71 u[IU]/mL (ref 0.35–4.94)

## 2019-12-05 LAB — VITAMIN D,25 OH,TOTAL: Vitamin D, 25 OH, Total: 39 ng/mL (ref 30–100)

## 2019-12-05 LAB — FERRITIN: Ferritin: 30.7 ng/mL (ref 4.60–204.00)

## 2019-12-05 LAB — T4, FREE: T4 Free: 1.02 ng/dL (ref 0.70–1.48)

## 2019-12-05 LAB — GFR: EGFR: >60

## 2019-12-05 NOTE — Progress Notes (Signed)
Have you seen any specialists/other providers since your last visit with us?    Yes; Derm    Arm preference verified?   Yes    The patient is due for nothing at this time, HM is up-to-date.

## 2019-12-05 NOTE — Progress Notes (Signed)
Subjective:      Patient ID: Peggy Kennedy is a 39 y.o. female  Chief Complaint   Patient presents with   . Thyroid Problem     follow up- pt is fasting    . Establish Care         Abdominal Pain  This is a new problem. The current episode started in the past 7 days. The onset quality is sudden. The problem occurs intermittently. The problem has been waxing and waning. The pain is located in the RLQ. The pain is mild. The quality of the pain is cramping. The abdominal pain does not radiate. Pertinent negatives include no constipation, diarrhea, dysuria, fever, hematuria, nausea or vomiting. The pain is aggravated by palpation. The pain is relieved by nothing. She has tried nothing for the symptoms. ovarian cyst       Problem   Acquired Hypothyroidism    Has Hashimoto's thyroiditis, has hypothyroidism. Takes levothyroxine . Since she had son who is 2.5yo it has been all over the place. Has felt it was messed up since then. Would like to recheck. Also feels that there's something in her throat.     Lab Results   Component Value Date    TSH 1.53 11/11/2018    T4FREE 1.19 11/11/2018              The following portions of the patient's history were reviewed and updated as appropriate: current medications, allergies, past family history, past medical history, past social history, past surgical history and problem list.     Review of Systems   Constitutional: Positive for fatigue. Negative for fever.   Gastrointestinal: Positive for abdominal pain. Negative for constipation, diarrhea, nausea and vomiting.   Genitourinary: Positive for pelvic pain (RLQ. has had cysts before. ) and vaginal bleeding (yesterday had some spotting, but shouldn't bc she's in the middle of her cycle). Negative for dysuria and hematuria.   Skin:        Feels hair is falling out.          BP 115/79   Pulse 73   Wt 66.7 kg (147 lb)   SpO2 99%   BMI 22.35 kg/m    Objective:   Physical Exam  Vitals and nursing note  reviewed.   Constitutional:       General: She is not in acute distress.     Appearance: She is well-developed.   Eyes:      Conjunctiva/sclera: Conjunctivae normal.   Neck:      Thyroid: No thyroid mass or thyromegaly.     Cardiovascular:      Rate and Rhythm: Normal rate and regular rhythm.      Heart sounds: Normal heart sounds.   Pulmonary:      Effort: Pulmonary effort is normal.      Breath sounds: Normal breath sounds.   Abdominal:      General: Bowel sounds are normal. There is no distension.      Palpations: There is no mass.      Tenderness: There is abdominal tenderness (mild) in the right lower quadrant. There is no guarding or rebound. Negative signs include Murphy's sign and McBurney's sign.       Lymphadenopathy:      Cervical: Cervical adenopathy (fullness, no discrete mass) present.   Neurological:      Mental Status: She is alert and oriented to person, place, and time.       UA HCG neg  Assessment:     1. Acquired hypothyroidism  TSH    T4, free    [place] Head Neck Soft Tissue   2. RLQ abdominal pain  POCT UA Dipstix (10)(Multi-Test)    POCT Pregnancy Test, Urine HCG    US Pelvis with Transvaginal    US Abdomen Limited Appendix   3. Chronic fatigue  CBC without differential    Comprehensive metabolic panel    Ferritin    IRON PROFILE   4. Encounter for lipid screening for cardiovascular disease  Lipid panel   5. Neck fullness  [place] Head Neck Soft Tissue   6. Vitamin D deficiency  Vitamin D,25 OH, Total         Plan:     39 y.o. female presents for the following:     1. Hypothyroidism: hashimoto's, has been stable. Has felt fullness on left side of neck, no discrete mass. Also with fatigue  -check TFTs  -thyroid US ordered  -adjust levothyroxine if needed     2. RLQ abd vs pelvic pain: hx of ovarian cyst, PCOS. No red flags. On OCP.   -check [place]  -check labs as ordered  -counseled on signs/symptoms to warrant re-evaluation   -if persists, follow up with gyn    3. Vit D def: not on supplement  -check  level  -start supplement as indicated     Return for follow up current symptoms as needed.     Gust Brooms, MD

## 2019-12-05 NOTE — Patient Instructions (Signed)
Hypothyroidism    You have hypothyroidism. This means your thyroid gland is not making enough thyroid hormone. This hormone is vital to body growth and metabolism. If you don't make enough, many body processes slow down. This can cause symptoms throughout the body. Hypothyroidism can range from mild to severe. The most severe form is called myxedema.  There are a number of causes of hypothyroidism. A common cause is Hashimoto's disease. This disease causes the body's own immune system to attack the thyroid gland. When you have certain treatments, such as surgery to remove the thyroid gland, this can also cause hypothyroidism. Sometimes the thyroid gland is not functioning because of lack of stimulation from the pituitary gland.  Symptoms of hypothyroidism can include:   Fatigue   Trouble concentrating or thinking clearly; forgetfulness   Dry skin   Hair loss   Weight gain   Low tolerance to cold   Constipation   Depression   Personality changes   Tingling or prickling of the hands or feet   Heavy, absent, or irregular periods (women only)  Older adults may sometimes have other symptoms. These can include:   Muscle aches and weakness   Confusion   Incontinence (unable to control urine or stool)   Trouble moving around   Falling  Treatment for hypothyroidism involves taking thyroid hormone pills daily. These pills replace the hormone your thyroid doesn't make. You will likelyneed to take a daily pill for the rest of your life. Tips for taking this medicine are given below.  Home care  Tips for taking your medicine   Take your thyroid hormone pills as prescribed by your healthcare provider. This is most often 1 pill a day on an empty stomach. Use a pillbox labeled with the days of the week. This will help you remember to take your pill each day.   Don't take products that contain iron and calcium or antacids within 4 hours of taking your thyroid hormone pills.   Don't take other medicines with  your thyroid hormone pill without checking with your provider first.   Tell your provider if you have any side effects from your medicines that bother you, especially any chest pain or irregular heartbeats.   Never change the dosageor stop taking your thyroid pills without talking to your provider first.  General care   Always talk with your provider before trying other medicines or treatments for your thyroid problem.   If you see other healthcare providers, be sure to let them know about your thyroid problem.   Let your healthcare provider know if you become pregnant because your dose of thyroid hormone will need to be adjusted.  Follow-up care  See your healthcare provider for checkups as advised. You may need regular tests to check the level of thyroid hormone in your blood.  When to seek medical advice  Call your healthcare provider right away if any of these occur:   New symptoms develop   Symptoms return, continue, or worsen even after treatment   Extreme fatigue   Puffy hands, face, or feet   Fast or irregular heartbeat   Confusion  Call 911  Call 911 if any of these occur:   Fainting   Chest pain   Shortness of breath or trouble breathing  [employer] last reviewed this educational content on 05/10/2016   2000-2021 [employer]. All rights reserved. This information is not intended as a substitute for professional medical care. Always follow your healthcare professional's instructions.

## 2019-12-07 ENCOUNTER — Encounter (INDEPENDENT_AMBULATORY_CARE_PROVIDER_SITE_OTHER): Payer: Self-pay | Admitting: Internal Medicine

## 2019-12-07 DIAGNOSIS — Z20822 Contact with and (suspected) exposure to covid-19: Secondary | ICD-10-CM

## 2019-12-15 ENCOUNTER — Ambulatory Visit: Payer: No Typology Code available for payment source

## 2019-12-15 ENCOUNTER — Ambulatory Visit: Payer: No Typology Code available for payment source | Attending: Internal Medicine

## 2019-12-15 DIAGNOSIS — R1031 Right lower quadrant pain: Secondary | ICD-10-CM | POA: Insufficient documentation

## 2019-12-15 DIAGNOSIS — R221 Localized swelling, mass and lump, neck: Secondary | ICD-10-CM

## 2019-12-15 DIAGNOSIS — E039 Hypothyroidism, unspecified: Secondary | ICD-10-CM

## 2019-12-15 DIAGNOSIS — E065 Other chronic thyroiditis: Secondary | ICD-10-CM | POA: Insufficient documentation

## 2019-12-23 ENCOUNTER — Encounter (INDEPENDENT_AMBULATORY_CARE_PROVIDER_SITE_OTHER): Payer: Self-pay | Admitting: Internal Medicine

## 2019-12-23 DIAGNOSIS — Z20822 Contact with and (suspected) exposure to covid-19: Secondary | ICD-10-CM

## 2019-12-25 NOTE — Progress Notes (Signed)
Pt hasn't viewed/responded to mychart result message. Please call and discuss with pt.

## 2020-01-31 ENCOUNTER — Other Ambulatory Visit (INDEPENDENT_AMBULATORY_CARE_PROVIDER_SITE_OTHER): Payer: Self-pay | Admitting: Family Medicine

## 2020-01-31 DIAGNOSIS — E039 Hypothyroidism, unspecified: Secondary | ICD-10-CM

## 2020-01-31 NOTE — Telephone Encounter (Signed)
Refill request redirected to PCP

## 2020-03-19 ENCOUNTER — Encounter (INDEPENDENT_AMBULATORY_CARE_PROVIDER_SITE_OTHER): Payer: Self-pay | Admitting: Internal Medicine

## 2020-03-19 DIAGNOSIS — Z1231 Encounter for screening mammogram for malignant neoplasm of breast: Secondary | ICD-10-CM

## 2020-03-20 NOTE — Progress Notes (Signed)
Mammogram ordered

## 2020-03-20 NOTE — Progress Notes (Signed)
Order for mammogram queued.   Also will advise due for physical exam.   Thanks!

## 2020-03-22 ENCOUNTER — Ambulatory Visit: Payer: No Typology Code available for payment source | Attending: Internal Medicine

## 2020-03-22 DIAGNOSIS — Z1231 Encounter for screening mammogram for malignant neoplasm of breast: Secondary | ICD-10-CM | POA: Insufficient documentation

## 2020-03-22 DIAGNOSIS — N6489 Other specified disorders of breast: Secondary | ICD-10-CM | POA: Insufficient documentation

## 2020-03-25 ENCOUNTER — Encounter (INDEPENDENT_AMBULATORY_CARE_PROVIDER_SITE_OTHER): Payer: Self-pay | Admitting: Internal Medicine

## 2020-03-25 ENCOUNTER — Telehealth (INDEPENDENT_AMBULATORY_CARE_PROVIDER_SITE_OTHER): Payer: Self-pay | Admitting: Internal Medicine

## 2020-03-25 NOTE — Telephone Encounter (Signed)
See pt message

## 2020-03-25 NOTE — Telephone Encounter (Signed)
Pt called in requesting results for recent mammo. She received results from mychart and has some concerns/questions. She would like a call back before Friday. Please advise.

## 2020-03-26 ENCOUNTER — Other Ambulatory Visit (INDEPENDENT_AMBULATORY_CARE_PROVIDER_SITE_OTHER): Payer: Self-pay | Admitting: Internal Medicine

## 2020-03-26 DIAGNOSIS — R928 Other abnormal and inconclusive findings on diagnostic imaging of breast: Secondary | ICD-10-CM

## 2020-03-26 NOTE — Telephone Encounter (Signed)
Patient was calling regarding an ultrasound diagnostic order. Call dropped called patient back went to voicemail (don't see anything in chart) Patient needs order to schedule her appointment with imaging center.  Patient can be reached at   [phone]    Thank you!

## 2020-03-26 NOTE — Telephone Encounter (Signed)
Per pts spouse please send mammogram orders to pt via mychart asap.

## 2020-03-26 NOTE — Telephone Encounter (Signed)
Sent to pt.

## 2020-03-28 ENCOUNTER — Other Ambulatory Visit: Payer: Self-pay | Admitting: Internal Medicine

## 2020-04-01 ENCOUNTER — Other Ambulatory Visit (INDEPENDENT_AMBULATORY_CARE_PROVIDER_SITE_OTHER): Payer: Self-pay

## 2020-04-09 ENCOUNTER — Other Ambulatory Visit (INDEPENDENT_AMBULATORY_CARE_PROVIDER_SITE_OTHER): Payer: Self-pay

## 2020-04-19 ENCOUNTER — Encounter (INDEPENDENT_AMBULATORY_CARE_PROVIDER_SITE_OTHER): Payer: Self-pay | Admitting: Internal Medicine

## 2020-04-19 ENCOUNTER — Other Ambulatory Visit (INDEPENDENT_AMBULATORY_CARE_PROVIDER_SITE_OTHER): Payer: Self-pay | Admitting: Internal Medicine

## 2020-04-19 DIAGNOSIS — R928 Other abnormal and inconclusive findings on diagnostic imaging of breast: Secondary | ICD-10-CM

## 2020-04-23 ENCOUNTER — Ambulatory Visit: Admission: RE | Admit: 2020-04-23 | Payer: No Typology Code available for payment source | Source: Ambulatory Visit

## 2020-04-23 ENCOUNTER — Ambulatory Visit: Payer: No Typology Code available for payment source

## 2020-05-01 ENCOUNTER — Ambulatory Visit (INDEPENDENT_AMBULATORY_CARE_PROVIDER_SITE_OTHER): Payer: No Typology Code available for payment source | Admitting: Internal Medicine

## 2020-05-01 ENCOUNTER — Encounter (INDEPENDENT_AMBULATORY_CARE_PROVIDER_SITE_OTHER): Payer: Self-pay | Admitting: Internal Medicine

## 2020-05-01 ENCOUNTER — Telehealth (INDEPENDENT_AMBULATORY_CARE_PROVIDER_SITE_OTHER): Payer: Self-pay | Admitting: Family Medicine

## 2020-05-01 VITALS — BP 107/73 | HR 89 | Temp 97.7°F | Ht 67.0 in | Wt 153.0 lb

## 2020-05-01 DIAGNOSIS — E162 Hypoglycemia, unspecified: Secondary | ICD-10-CM

## 2020-05-01 DIAGNOSIS — E282 Polycystic ovarian syndrome: Secondary | ICD-10-CM

## 2020-05-01 DIAGNOSIS — M5136 Other intervertebral disc degeneration, lumbar region: Secondary | ICD-10-CM

## 2020-05-01 DIAGNOSIS — Z0000 Encounter for general adult medical examination without abnormal findings: Secondary | ICD-10-CM

## 2020-05-01 DIAGNOSIS — R5382 Chronic fatigue, unspecified: Secondary | ICD-10-CM

## 2020-05-01 DIAGNOSIS — E039 Hypothyroidism, unspecified: Secondary | ICD-10-CM

## 2020-05-01 LAB — HEMOLYSIS INDEX: Hemolysis Index: 8 (ref 0–24)

## 2020-05-01 LAB — CBC
Absolute NRBC: 0 10*3/uL (ref 0.00–0.00)
Hematocrit: 42 % (ref 34.7–43.7)
Hgb: 13.8 g/dL (ref 11.4–14.8)
MCH: 28.6 pg (ref 25.1–33.5)
MCHC: 32.9 g/dL (ref 31.5–35.8)
MCV: 87.1 fL (ref 78.0–96.0)
MPV: 10.6 fL (ref 8.9–12.5)
Nucleated RBC: 0 /100 WBC (ref 0.0–0.0)
Platelets: 250 10*3/uL (ref 142–346)
RBC: 4.82 10*6/uL (ref 3.90–5.10)
RDW: 13 % (ref 11–15)
WBC: 6.94 10*3/uL (ref 3.10–9.50)

## 2020-05-01 LAB — COMPREHENSIVE METABOLIC PANEL
ALT: 25 U/L (ref 0–55)
AST (SGOT): 29 U/L (ref 5–34)
Albumin/Globulin Ratio: 1.2 (ref 0.9–2.2)
Albumin: 4 g/dL (ref 3.5–5.0)
Alkaline Phosphatase: 46 U/L (ref 37–117)
Anion Gap: 18 — ABNORMAL HIGH (ref 5.0–15.0)
BUN: 13 mg/dL (ref 7.0–19.0)
Bilirubin, Total: 0.4 mg/dL (ref 0.2–1.2)
CO2: 17 mEq/L — ABNORMAL LOW (ref 21–29)
Calcium: 9.2 mg/dL (ref 8.5–10.5)
Chloride: 103 mEq/L (ref 100–111)
Creatinine: 1 mg/dL (ref 0.4–1.5)
Globulin: 3.4 g/dL (ref 2.0–3.7)
Glucose: 50 mg/dL — CL (ref 70–100)
Potassium: 4.7 mEq/L (ref 3.5–5.1)
Protein, Total: 7.4 g/dL (ref 6.0–8.3)
Sodium: 138 mEq/L (ref 136–145)

## 2020-05-01 LAB — TSH: TSH: 3.11 u[IU]/mL (ref 0.35–4.94)

## 2020-05-01 LAB — T4, FREE: T4 Free: 1.18 ng/dL (ref 0.70–1.48)

## 2020-05-01 LAB — GFR: EGFR: >60

## 2020-05-01 NOTE — Progress Notes (Signed)
Have you seen any specialists/other providers since your last visit with us?      Yes. Dermatologist      Arm preference verified?     Yes    The patient is due for nothing at this time, HM is up-to-date.

## 2020-05-01 NOTE — Progress Notes (Signed)
Subjective:      Date: 05/01/2020 2:00 PM   Patient ID: Peggy Kennedy is a 40 y.o. female.    Chief Complaint:  Chief Complaint   Patient presents with   . Annual Exam     Pt is fasting        HPI:  She notes she has been feeling more fatigued lately. Also feels that she is colder. Still has constipation. Has been getting enough sleep.    Has some focal pain in left breast that is around a 3/10 she thinks may be related to how she holds her 64 YO son. She has not noticed a recurrence of RLQ abdominal pain.     Has chronic back pain that occurs every day but flares every couple months.     ROS: No fever, headaches, changes in urination    Problem List:  Patient Active Problem List   Diagnosis   . Acquired hypothyroidism   . DDD (degenerative disc disease), lumbar   . PCOS (polycystic ovarian syndrome)       Current Medications:  Current Outpatient Medications   Medication Sig Dispense Refill   . drospirenone-ethinyl estradiol ([name]) 3-0.02 MG per tablet Take 1 tablet by mouth daily 84 tablet 1   . levothyroxine (SYNTHROID) 112 MCG tablet TAKE 1 TABLET BY MOUTH EVERY DAY 90 tablet 2     No current facility-administered medications for this visit.       Allergies:  No Known Allergies    Past Medical History:  Past Medical History:   Diagnosis Date   . Back pain    . Hypothyroid        Past Surgical History:  Past Surgical History:   Procedure Laterality Date   . CESAREAN SECTION  02/2017   . csection  2014       Family History:  Family History   Problem Relation Age of Onset   . No known problems Mother    . Cancer Father         stomach cancer   . Breast cancer Paternal Aunt        Social History:  Social History     Occupational History   . Not on file   Tobacco Use   . Smoking status: Never Smoker   . Smokeless tobacco: Never Used   Vaping Use   . Vaping Use: Never used   Substance and Sexual Activity   . Alcohol use: Yes     Comment: occassionally   . Drug use: Never   . Sexual activity: Yes      Partners: Male     Birth control/protection: Pill     Comment: [name]         The following sections were reviewed this encounter by the provider:            Objective:     Vitals:  BP 107/73   Pulse 89   Temp 97.7 F (36.5 C)   Ht 1.702 m (5\' 7" )   Wt 69.4 kg (153 lb)   LMP 04/30/2020 (Exact Date)   SpO2 98%   BMI 23.96 kg/m     Physical Exam:  HEENT: No thyromegaly  Cardio: Normal S1 S2 no murmurs rubs gallops  Pulm: CTAB  Ab: RLQ tenderness, otherwise soft and non-distended  Neuro: Normal motor strength, no loss of sensation    Assessment/Plan:   Peggy Kennedy is a 40 YO F who presents for  an annual physical.    #Hashimoto's Thyroiditis  - May have under dosing of levothyroxine based on reported sx  - Titrate levothyroxine based on thyroid panel  - TSH/T3/T4    #Fatigue  - Hemolysis index  - CBC/CMP    #RLQ ab tenderness  - Consider FU with gyn with worsening    Screenings: UTD on Pap smear and mammogram    FU in 1 year for next physical    Peggy Kennedy, BS

## 2020-05-01 NOTE — Progress Notes (Signed)
Subjective:      Patient ID: Peggy Kennedy is a 40 y.o. female    Chief Complaint   Patient presents with   . Annual Exam     Pt is fasting         Pt presents for physical.   Has been feeling more fatigued lately.   Left breast pain, mild, holding 3 yo son.     The following portions of the patient's history were reviewed and updated as appropriate: current medications, allergies, past family history, past medical history, past social history, past surgical history and problem list.     Review of Systems   Constitutional: Positive for fatigue. Negative for chills, fever and unexpected weight change.   HENT: Negative for congestion, ear pain, hearing loss, mouth sores, rhinorrhea, sinus pressure and sore throat.    Eyes: Negative for itching and visual disturbance.   Respiratory: Negative for cough and shortness of breath.    Cardiovascular: Negative for chest pain.   Gastrointestinal: Positive for constipation. Negative for abdominal pain, blood in stool, diarrhea, nausea and vomiting.   Endocrine: Positive for cold intolerance.   Genitourinary: Negative for difficulty urinating, dysuria, hematuria, menstrual problem, vaginal bleeding, vaginal discharge and vaginal pain.   Musculoskeletal: Positive for back pain (chronic, mild). Negative for arthralgias, joint swelling and myalgias.   Skin: Negative for rash.   Neurological: Negative for dizziness, weakness, light-headedness and headaches.   Hematological: Negative for adenopathy. Does not bruise/bleed easily.   Psychiatric/Behavioral: The patient is not nervous/anxious.    All other systems reviewed and are negative.       Patient Active Problem List   Diagnosis   . Acquired hypothyroidism   . DDD (degenerative disc disease), lumbar   . PCOS (polycystic ovarian syndrome)     Past Medical History:   Diagnosis Date   . Back pain    . Hypothyroid      Past Surgical History:   Procedure Laterality Date   . CESAREAN SECTION  02/2017   . csection   2014     Family History   Problem Relation Age of Onset   . No known problems Mother    . Cancer Father         stomach cancer   . Breast cancer Paternal Aunt      Social History     Socioeconomic History   . Marital status: Married   Tobacco Use   . Smoking status: Never Smoker   . Smokeless tobacco: Never Used   Vaping Use   . Vaping Use: Never used   Substance and Sexual Activity   . Alcohol use: Yes     Comment: occassionally   . Drug use: Never   . Sexual activity: Yes     Partners: Male     Birth control/protection: Pill     Comment: [name]     Social Determinants of Health     Financial Resource Strain: Low Risk    . Difficulty of Paying Living Expenses: Not very hard   Food Insecurity: No Food Insecurity   . Worried About Programme researcher, broadcasting/film/video in the Last Year: Never true   . Ran Out of Food in the Last Year: Never true   Transportation Needs: No Transportation Needs   . Lack of Transportation (Medical): No   . Lack of Transportation (Non-Medical): No   Physical Activity: Sufficiently Active   . Days of Exercise per Week: 5 days   .  Minutes of Exercise per Session: 30 min   Stress: Stress Concern Present   . Feeling of Stress : To some extent   Social Connections: Moderately Integrated   . Frequency of Communication with Friends and Family: More than three times a week   . Frequency of Social Gatherings with Friends and Family: More than three times a week   . Attends Religious Services: More than 4 times per year   . Active Member of Clubs or Organizations: No   . Attends Banker Meetings: Never   . Marital Status: Married   Catering manager Violence: Not At Risk   . Fear of Current or Ex-Partner: No   . Emotionally Abused: No   . Physically Abused: No   . Sexually Abused: No   Housing Stability: Low Risk    . Unable to Pay for Housing in the Last Year: No   . Number of Places Lived in the Last Year: 1   . Unstable Housing in the Last Year: No     Current Outpatient Medications   Medication Sig  Dispense Refill   . drospirenone-ethinyl estradiol ([name]) 3-0.02 MG per tablet Take 1 tablet by mouth daily 84 tablet 1   . levothyroxine (SYNTHROID) 112 MCG tablet TAKE 1 TABLET BY MOUTH EVERY DAY 90 tablet 2     No current facility-administered medications for this visit.     No Known Allergies      BP 107/73   Pulse 89   Temp 97.7 F (36.5 C)   Ht 1.702 m (5\' 7" )   Wt 69.4 kg (153 lb)   LMP 04/30/2020 (Exact Date)   SpO2 98%   BMI 23.96 kg/m    Objective:   Physical Exam  Vitals reviewed.   Constitutional:       Appearance: She is well-developed.   HENT:      Head: Normocephalic and atraumatic.      Right Ear: Hearing, tympanic membrane and external ear normal.      Left Ear: Hearing, tympanic membrane and external ear normal.      Nose: Nose normal.      Mouth/Throat:      Pharynx: No oropharyngeal exudate.   Eyes:      Conjunctiva/sclera: Conjunctivae normal.      Pupils: Pupils are equal, round, and reactive to light.   Neck:      Thyroid: No thyromegaly.   Cardiovascular:      Rate and Rhythm: Normal rate and regular rhythm.      Heart sounds: Normal heart sounds. No murmur heard.  Pulmonary:      Effort: Pulmonary effort is normal. No respiratory distress.      Breath sounds: Normal breath sounds.   Abdominal:      General: Bowel sounds are normal. There is no distension.      Palpations: Abdomen is soft. There is no mass.      Tenderness: There is no abdominal tenderness. There is no guarding or rebound. Negative signs include Murphy's sign.      Comments: No hepatosplenomegaly   Musculoskeletal:         General: No tenderness. Normal range of motion.      Cervical back: Normal range of motion and neck supple.   Lymphadenopathy:      Cervical: No cervical adenopathy.   Skin:     General: Skin is warm and dry.      Findings: No rash.   Neurological:  Mental Status: She is alert and oriented to person, place, and time.         Assessment:     1. Routine history and physical examination of adult   CBC without differential    Comprehensive metabolic panel   2. Acquired hypothyroidism  TSH    T4, free   3. PCOS (polycystic ovarian syndrome)     4. DDD (degenerative disc disease), lumbar     5. Chronic fatigue  CBC without differential    Comprehensive metabolic panel    TSH    T4, free         Plan:     40 y.o. female presents for physical and fasting blood work    1.Discussed the patient's BMI with her.  Body mass index is 23.96 kg/m.     2. Counseling: Counseling/Anticipatory Guidance (40-64): Nutrition, physical activity, healthy weight, injury prevention, misuse of tobacco, alcohol and drugs, sexual behavior and STDs, contraception, dental health, mental health, immunizations, screenings.     3. Hypothyroidism: hashimoto's, has been stable. Fatigue  -check TFTs  -thyroid US in 2 years  -adjust levothyroxine if needed     4. RLQ abd vs pelvic pain: hx of ovarian cyst, PCOS. No red flags. On OCP. Mostly resolved   -Pelvic and abd [place] reassuring  -continue to monitor    5. Vit D def: resolved  -standard supplementation     6. Breast pain: normal mammo, likely fibrocystic change  -reassured  -consider reducing chocolate  -consider trial of evening primrose oil  -if persists or worries her, consider breast referral    7. Chronic back pain: stable  -continue supportive care     Health maintenance:  -Fasting blood work: today, reviewed planned labs and pt agrees to them.   -Immunizations: UTD  -STD screen: declined  -Pap: UTD  -Mammogram: UTD  -DEXA: due at 53  -Colonoscopy: due at 32  -Vision: UTD  -Dental: UTD  -Advanced directive: discuss at future visit     Return in about 1 year (around 05/01/2021) for next physical.     Gust Brooms, MD

## 2020-05-01 NOTE — Telephone Encounter (Signed)
On call note:  Lab paged on call physician with critical lab of glucose 50.  Attempted to call patient and also patient's husband phone- both did not pick up and mailbox full.    FWD to PCP to f/u

## 2020-05-02 ENCOUNTER — Encounter (INDEPENDENT_AMBULATORY_CARE_PROVIDER_SITE_OTHER): Payer: Self-pay | Admitting: Internal Medicine

## 2020-05-02 NOTE — Addendum Note (Signed)
Addended by: Mariann Laster on: 05/02/2020 08:06 AM     Modules accepted: Orders

## 2020-05-02 NOTE — Telephone Encounter (Signed)
Pt sent mychart message

## 2020-05-02 NOTE — Telephone Encounter (Signed)
Please call and check on pt. This was likely sample as she was normal in office.

## 2020-05-21 ENCOUNTER — Encounter (INDEPENDENT_AMBULATORY_CARE_PROVIDER_SITE_OTHER): Payer: Self-pay | Admitting: Internal Medicine

## 2020-09-18 ENCOUNTER — Encounter (INDEPENDENT_AMBULATORY_CARE_PROVIDER_SITE_OTHER): Payer: Self-pay | Admitting: Internal Medicine

## 2020-10-10 ENCOUNTER — Other Ambulatory Visit (INDEPENDENT_AMBULATORY_CARE_PROVIDER_SITE_OTHER): Payer: Self-pay | Admitting: Internal Medicine

## 2020-12-02 ENCOUNTER — Other Ambulatory Visit (INDEPENDENT_AMBULATORY_CARE_PROVIDER_SITE_OTHER): Payer: Self-pay | Admitting: Family

## 2020-12-02 DIAGNOSIS — E039 Hypothyroidism, unspecified: Secondary | ICD-10-CM

## 2020-12-03 NOTE — Telephone Encounter (Signed)
Refill sent.

## 2021-01-22 ENCOUNTER — Ambulatory Visit (INDEPENDENT_AMBULATORY_CARE_PROVIDER_SITE_OTHER): Payer: PRIVATE HEALTH INSURANCE | Admitting: Internal Medicine

## 2021-01-22 ENCOUNTER — Encounter (INDEPENDENT_AMBULATORY_CARE_PROVIDER_SITE_OTHER): Payer: Self-pay | Admitting: Internal Medicine

## 2021-01-22 VITALS — BP 129/88 | HR 87 | Temp 98.0°F | Ht 67.0 in | Wt 171.0 lb

## 2021-01-22 DIAGNOSIS — Z136 Encounter for screening for cardiovascular disorders: Secondary | ICD-10-CM

## 2021-01-22 DIAGNOSIS — E039 Hypothyroidism, unspecified: Secondary | ICD-10-CM

## 2021-01-22 DIAGNOSIS — Z1231 Encounter for screening mammogram for malignant neoplasm of breast: Secondary | ICD-10-CM

## 2021-01-22 DIAGNOSIS — M5136 Other intervertebral disc degeneration, lumbar region: Secondary | ICD-10-CM

## 2021-01-22 DIAGNOSIS — R5382 Chronic fatigue, unspecified: Secondary | ICD-10-CM

## 2021-01-22 DIAGNOSIS — Z13220 Encounter for screening for lipoid disorders: Secondary | ICD-10-CM

## 2021-01-22 DIAGNOSIS — Z23 Encounter for immunization: Secondary | ICD-10-CM

## 2021-01-22 LAB — COMPREHENSIVE METABOLIC PANEL
ALT: 14 U/L (ref 0–55)
AST (SGOT): 13 U/L (ref 5–41)
Albumin/Globulin Ratio: 1.2 (ref 0.9–2.2)
Albumin: 3.7 g/dL (ref 3.5–5.0)
Alkaline Phosphatase: 45 U/L (ref 37–117)
Anion Gap: 7 (ref 5.0–15.0)
BUN: 9 mg/dL (ref 7.0–21.0)
Bilirubin, Total: 0.3 mg/dL (ref 0.2–1.2)
CO2: 25 mEq/L (ref 17–29)
Calcium: 9 mg/dL (ref 8.5–10.5)
Chloride: 108 mEq/L (ref 99–111)
Creatinine: 0.9 mg/dL (ref 0.4–1.0)
Globulin: 3 g/dL (ref 2.0–3.6)
Glucose: 97 mg/dL (ref 70–100)
Potassium: 4.5 mEq/L (ref 3.5–5.3)
Protein, Total: 6.7 g/dL (ref 6.0–8.3)
Sodium: 140 mEq/L (ref 135–145)

## 2021-01-22 LAB — LIPID PANEL
Cholesterol / HDL Ratio: 2.8 Index
Cholesterol: 159 mg/dL (ref 0–199)
HDL: 56 mg/dL (ref 40–9999)
LDL Calculated: 82 mg/dL (ref 0–99)
Triglycerides: 107 mg/dL (ref 34–149)
VLDL Calculated: 21 mg/dL (ref 10–40)

## 2021-01-22 LAB — CBC
Absolute NRBC: 0 10*3/uL (ref 0.00–0.00)
Hematocrit: 39.5 % (ref 34.7–43.7)
Hgb: 12.9 g/dL (ref 11.4–14.8)
MCH: 28.8 pg (ref 25.1–33.5)
MCHC: 32.7 g/dL (ref 31.5–35.8)
MCV: 88.2 fL (ref 78.0–96.0)
MPV: 10.8 fL (ref 8.9–12.5)
Nucleated RBC: 0 /100 WBC (ref 0.0–0.0)
Platelets: 211 10*3/uL (ref 142–346)
RBC: 4.48 10*6/uL (ref 3.90–5.10)
RDW: 12 % (ref 11–15)
WBC: 6.59 10*3/uL (ref 3.10–9.50)

## 2021-01-22 LAB — TSH: TSH: 5.87 u[IU]/mL — ABNORMAL HIGH (ref 0.35–4.94)

## 2021-01-22 LAB — HEMOLYSIS INDEX: Hemolysis Index: 6 Index (ref 0–24)

## 2021-01-22 LAB — GFR: EGFR: >60

## 2021-01-22 LAB — T4, FREE: T4 Free: 0.92 ng/dL (ref 0.69–1.48)

## 2021-01-22 NOTE — Progress Notes (Signed)
Have you seen any specialists since your last visit with [place]?  No      The patient was informed that the following HM items are still outstanding:       Flu vaccine  Covid vaccine wants to discuss

## 2021-01-22 NOTE — Progress Notes (Addendum)
Subjective:      Patient ID: Peggy Kennedy is a 40 y.o. female  Chief Complaint   Patient presents with    Follow-up     Thyroid. Pt would like to have Flu vaccine and Covid vaccine         HPI    Problem   Acquired Hypothyroidism    Has Hashimoto's thyroiditis, has hypothyroidism. Takes levothyroxine . Since she had son who is 2.5yo it has been all over the place. Has felt it was messed up since then. Would like to recheck. Also feels that there's something in her throat.     Since last visit: still feels like something is still not quite right. Still feels tired. Libido is down. Knows lifestyle plays a role with work and kids, but just wants to check it again. Also struggling with continued weight loss efforts.     Lab Results   Component Value Date    TSH 3.11 05/01/2020    T4FREE 1.18 05/01/2020        Lab Results   Component Value Date    TSH 1.53 11/11/2018    T4FREE 1.19 11/11/2018         Ddd (Degenerative Disc Disease), Lumbar    Feels like it continues and will likely need surgery at some point. But feels the difficulty with weight loss is not helping.           The following portions of the patient's history were reviewed and updated as appropriate: current medications, allergies, past family history, past medical history, past social history, past surgical history and problem list.     Review of Systems   Constitutional:  Positive for fatigue.   Respiratory: Negative.     Cardiovascular: Negative.    Musculoskeletal:  Positive for back pain.       BP 130/91-->129/88   Pulse 87   Temp 98 F (36.7 C)   Ht 1.702 m (5\' 7" )   Wt 77.6 kg (171 lb)   SpO2 99%   BMI 26.78 kg/m    Objective:   Physical Exam  Vitals and nursing note reviewed.   Constitutional:       General: She is not in acute distress.     Appearance: She is well-developed.   Eyes:      Conjunctiva/sclera: Conjunctivae normal.   Neck:      Thyroid: No thyroid mass, thyromegaly or thyroid tenderness.    Cardiovascular:      Rate and Rhythm: Normal rate and regular rhythm.      Heart sounds: Normal heart sounds.   Pulmonary:      Effort: Pulmonary effort is normal.      Breath sounds: Normal breath sounds.   Neurological:      General: No focal deficit present.      Mental Status: She is alert.       Assessment:     1. Acquired hypothyroidism  TSH    T4, free      2. Need for vaccination  Flu vaccine QUADRIVALENT (PF) 6 months and older (FLULAVAL/FLUARIX)    COVID-19 Bivalent Vaccine 12 Years And Up Booster [employer])      3. DDD (degenerative disc disease), lumbar        4. Chronic fatigue  CBC without differential    Comprehensive metabolic panel    Lipid panel      5. Encounter for lipid screening for cardiovascular disease  Lipid panel  6. Encounter for screening mammogram for high-risk patient  Mammo Digital 2D Screening Bilateral W CAD            Plan:     40 y.o. female presents for follow up.     1. Hypothyroidism: hashimoto's, TSH was a little higher ~3 last year. Has persistent fatigue that may be symptomatic, uncontrolled hypothyroidism   -check TFTs. Consider increased levothyroxine if TSH >1-2  -thyroid US in 2 years, reviewed last [place] 12/2019  -adjust levothyroxine if needed     2. Chronic back pain: chronic, stable. Was told she may need surgery in the future  -continue supportive care. Counseled on core strengthening    3. Difficulty losing weight: BMI 26, no red flags  -optimize thyroid function as above  -otherwise counseled that her weight is not in a concerning range. Discussed strategies on healthy lifestyle. If she has personal goal for weight loss, discussed strategies and can consider medical weight loss consultation.     4. Borderline BP:   -monitor for now, follow up at next physical in a few months.     Health maintenance:  -Fasting blood work: today, reviewed planned labs and pt agrees to them.   -Immunizations: flu, covid today, otherwise UTD  -STD screen: declined  -Pap:  UTD  -Mammogram: UTD, future order given   -DEXA: due at 8  -Colonoscopy: due at 16  -Vision: UTD  -Dental: UTD  -Advanced directive: discuss at future visit     Return in about 3 months (around 04/22/2021) for next physical.    Gust Brooms, MD

## 2021-02-09 DIAGNOSIS — I10 Essential (primary) hypertension: Secondary | ICD-10-CM

## 2021-02-09 HISTORY — DX: Essential (primary) hypertension: I10

## 2021-02-13 ENCOUNTER — Ambulatory Visit (INDEPENDENT_AMBULATORY_CARE_PROVIDER_SITE_OTHER): Payer: PRIVATE HEALTH INSURANCE | Admitting: Internal Medicine

## 2021-02-16 ENCOUNTER — Encounter (INDEPENDENT_AMBULATORY_CARE_PROVIDER_SITE_OTHER): Payer: Self-pay | Admitting: Internal Medicine

## 2021-02-16 DIAGNOSIS — E039 Hypothyroidism, unspecified: Secondary | ICD-10-CM

## 2021-02-17 MED ORDER — LEVOTHYROXINE SODIUM 125 MCG PO TABS
125.0000 ug | ORAL_TABLET | Freq: Every day | ORAL | 0 refills | Status: DC
Start: 2021-02-17 — End: 2021-05-06

## 2021-03-18 ENCOUNTER — Other Ambulatory Visit (INDEPENDENT_AMBULATORY_CARE_PROVIDER_SITE_OTHER): Payer: Self-pay | Admitting: Internal Medicine

## 2021-03-18 DIAGNOSIS — Z1231 Encounter for screening mammogram for malignant neoplasm of breast: Secondary | ICD-10-CM

## 2021-03-21 ENCOUNTER — Other Ambulatory Visit (INDEPENDENT_AMBULATORY_CARE_PROVIDER_SITE_OTHER): Payer: Self-pay | Admitting: Internal Medicine

## 2021-03-21 ENCOUNTER — Ambulatory Visit: Payer: PRIVATE HEALTH INSURANCE | Attending: Internal Medicine

## 2021-03-21 DIAGNOSIS — Z1231 Encounter for screening mammogram for malignant neoplasm of breast: Secondary | ICD-10-CM | POA: Insufficient documentation

## 2021-04-15 ENCOUNTER — Encounter (INDEPENDENT_AMBULATORY_CARE_PROVIDER_SITE_OTHER): Payer: Self-pay | Admitting: Internal Medicine

## 2021-05-06 ENCOUNTER — Ambulatory Visit (INDEPENDENT_AMBULATORY_CARE_PROVIDER_SITE_OTHER): Payer: PRIVATE HEALTH INSURANCE | Admitting: Internal Medicine

## 2021-05-06 ENCOUNTER — Encounter (INDEPENDENT_AMBULATORY_CARE_PROVIDER_SITE_OTHER): Payer: Self-pay | Admitting: Internal Medicine

## 2021-05-06 VITALS — BP 137/98 | HR 88 | Temp 98.3°F | Ht 67.0 in | Wt 179.1 lb

## 2021-05-06 DIAGNOSIS — Z136 Encounter for screening for cardiovascular disorders: Secondary | ICD-10-CM

## 2021-05-06 DIAGNOSIS — M5136 Other intervertebral disc degeneration, lumbar region: Secondary | ICD-10-CM

## 2021-05-06 DIAGNOSIS — Z13220 Encounter for screening for lipoid disorders: Secondary | ICD-10-CM

## 2021-05-06 DIAGNOSIS — B001 Herpesviral vesicular dermatitis: Secondary | ICD-10-CM

## 2021-05-06 DIAGNOSIS — R519 Headache, unspecified: Secondary | ICD-10-CM

## 2021-05-06 DIAGNOSIS — Z1159 Encounter for screening for other viral diseases: Secondary | ICD-10-CM

## 2021-05-06 DIAGNOSIS — E039 Hypothyroidism, unspecified: Secondary | ICD-10-CM

## 2021-05-06 DIAGNOSIS — E282 Polycystic ovarian syndrome: Secondary | ICD-10-CM

## 2021-05-06 DIAGNOSIS — Z0000 Encounter for general adult medical examination without abnormal findings: Secondary | ICD-10-CM

## 2021-05-06 DIAGNOSIS — Z1231 Encounter for screening mammogram for malignant neoplasm of breast: Secondary | ICD-10-CM

## 2021-05-06 LAB — COMPREHENSIVE METABOLIC PANEL
ALT: 24 U/L (ref 0–55)
AST (SGOT): 23 U/L (ref 5–41)
Albumin/Globulin Ratio: 1.1 (ref 0.9–2.2)
Albumin: 3.6 g/dL (ref 3.5–5.0)
Alkaline Phosphatase: 60 U/L (ref 37–117)
Anion Gap: 8 (ref 5.0–15.0)
BUN: 12 mg/dL (ref 7.0–21.0)
Bilirubin, Total: 0.3 mg/dL (ref 0.2–1.2)
CO2: 25 mEq/L (ref 17–29)
Calcium: 9 mg/dL (ref 8.5–10.5)
Chloride: 107 mEq/L (ref 99–111)
Creatinine: 0.8 mg/dL (ref 0.4–1.0)
Globulin: 3.3 g/dL (ref 2.0–3.6)
Glucose: 85 mg/dL (ref 70–100)
Potassium: 4.7 mEq/L (ref 3.5–5.3)
Protein, Total: 6.9 g/dL (ref 6.0–8.3)
Sodium: 140 mEq/L (ref 135–145)

## 2021-05-06 LAB — CBC
Absolute NRBC: 0 10*3/uL (ref 0.00–0.00)
Hematocrit: 39.7 % (ref 34.7–43.7)
Hgb: 13.3 g/dL (ref 11.4–14.8)
MCH: 28.4 pg (ref 25.1–33.5)
MCHC: 33.5 g/dL (ref 31.5–35.8)
MCV: 84.6 fL (ref 78.0–96.0)
MPV: 10.3 fL (ref 8.9–12.5)
Nucleated RBC: 0 /100 WBC (ref 0.0–0.0)
Platelets: 232 10*3/uL (ref 142–346)
RBC: 4.69 10*6/uL (ref 3.90–5.10)
RDW: 13 % (ref 11–15)
WBC: 6.27 10*3/uL (ref 3.10–9.50)

## 2021-05-06 LAB — LIPID PANEL
Cholesterol / HDL Ratio: 3.5 Index
Cholesterol: 188 mg/dL (ref 0–199)
HDL: 53 mg/dL (ref 40–9999)
LDL Calculated: 118 mg/dL — ABNORMAL HIGH (ref 0–99)
Triglycerides: 83 mg/dL (ref 34–149)
VLDL Calculated: 17 mg/dL (ref 10–40)

## 2021-05-06 LAB — HEMOLYSIS INDEX: Hemolysis Index: 20 Index (ref 0–24)

## 2021-05-06 LAB — GFR: EGFR: >60

## 2021-05-06 LAB — TSH: TSH: 3.63 u[IU]/mL (ref 0.35–4.94)

## 2021-05-06 LAB — T4, FREE: T4 Free: 1.12 ng/dL (ref 0.69–1.48)

## 2021-05-06 LAB — HEPATITIS C ANTIBODY: Hepatitis C, AB: NONREACTIVE

## 2021-05-06 MED ORDER — LEVOTHYROXINE SODIUM 125 MCG PO TABS
125.0000 ug | ORAL_TABLET | Freq: Every day | ORAL | 0 refills | Status: DC
Start: 2021-05-06 — End: 2021-08-11

## 2021-05-06 MED ORDER — VALACYCLOVIR HCL 1 G PO TABS
2000.0000 mg | ORAL_TABLET | Freq: Two times a day (BID) | ORAL | 0 refills | Status: DC
Start: 2021-05-06 — End: 2021-10-24

## 2021-05-06 NOTE — Progress Notes (Signed)
Subjective:      Patient ID: Peggy Kennedy is a 41 y.o. female    Chief Complaint   Patient presents with    Annual Exam     Frequent headaches discussed with gyno as well and will change prescription to see if that helps. Would like to discuss with dr.Avonda Toso as well.       Hypothyroidism        Pt presents for physical. Father passed in Jan. So grieving.      Migraine   This is a new problem. The current episode started more than 1 month ago. The pain is located in the Left unilateral region. The pain does not radiate. The pain quality is similar to prior headaches. The quality of the pain is described as throbbing. The pain is moderate. Pertinent negatives include no abdominal pain, coughing, dizziness, ear pain, fever, hearing loss, nausea, rhinorrhea, sinus pressure, sore throat, vomiting or weakness. The symptoms are aggravated by emotional stress and bright light. She has tried NSAIDs for the symptoms.   Dealing with headaches. Always had during period. Now happening outside of period.     The following portions of the patient's history were reviewed and updated as appropriate: current medications, allergies, past family history, past medical history, past social history, past surgical history and problem list.     Review of Systems   Constitutional:  Negative for chills, fatigue, fever and unexpected weight change.   HENT:  Negative for congestion, ear pain, hearing loss, mouth sores, rhinorrhea, sinus pressure and sore throat.    Eyes:  Negative for itching and visual disturbance.   Respiratory:  Negative for cough and shortness of breath.    Cardiovascular:  Negative for chest pain.   Gastrointestinal:  Negative for abdominal pain, blood in stool, constipation, diarrhea, nausea and vomiting.   Genitourinary:  Negative for difficulty urinating, dysuria, hematuria, menstrual problem, vaginal bleeding, vaginal discharge and vaginal pain.   Musculoskeletal:  Negative for arthralgias,  joint swelling and myalgias.   Skin:  Negative for rash.   Neurological:  Positive for headaches (week of period gets very severe headaches. has seen GYN. planning to change OCP. but now also getting them outside of her priod week in the past few months.). Negative for dizziness, weakness and light-headedness.   Hematological:  Negative for adenopathy. Does not bruise/bleed easily.   Psychiatric/Behavioral:  The patient is not nervous/anxious.    All other systems reviewed and are negative.       Patient Active Problem List   Diagnosis    Acquired hypothyroidism    DDD (degenerative disc disease), lumbar    PCOS (polycystic ovarian syndrome)     Past Medical History:   Diagnosis Date    Back pain     Hypothyroid      Past Surgical History:   Procedure Laterality Date    CESAREAN SECTION  02/2017    csection  2014     Family History   Problem Relation Age of Onset    No known problems Mother     Cancer Father         stomach cancer    Breast cancer Paternal Aunt      Social History     Socioeconomic History    Marital status: Married   Tobacco Use    Smoking status: Never    Smokeless tobacco: Never   Vaping Use    Vaping status: Never Used   Substance and Sexual  Activity    Alcohol use: Yes     Comment: occassionally    Drug use: Never    Sexual activity: Yes     Partners: Male     Birth control/protection: Pill     Comment: [name]     Social Determinants of Health     Financial Resource Strain: Low Risk  (05/05/2021)    Overall Financial Resource Strain (CARDIA)     Difficulty of Paying Living Expenses: Not very hard   Food Insecurity: No Food Insecurity (05/05/2021)    Hunger Vital Sign     Worried About Running Out of Food in the Last Year: Never true     Ran Out of Food in the Last Year: Never true   Transportation Needs: No Transportation Needs (05/05/2021)    PRAPARE - Therapist, art (Medical): No     Lack of Transportation (Non-Medical): No   Physical Activity: Insufficiently Active  (05/05/2021)    Exercise Vital Sign     Days of Exercise per Week: 1 day     Minutes of Exercise per Session: 30 min   Stress: Stress Concern Present (05/05/2021)    [employer] of Occupational Health - Occupational Stress Questionnaire     Feeling of Stress : To some extent   Social Connections: Socially Integrated (05/05/2021)    Social Connection and Isolation Panel [NHANES]     Frequency of Communication with Friends and Family: More than three times a week     Frequency of Social Gatherings with Friends and Family: Once a week     Attends Religious Services: More than 4 times per year     Active Member of [employer] or Organizations: Yes     Attends Banker Meetings: 1 to 4 times per year     Marital Status: Married   Catering manager Violence: Not At Risk (05/05/2021)    Humiliation, Afraid, Rape, and Kick questionnaire     Fear of Current or Ex-Partner: No     Emotionally Abused: No     Physically Abused: No     Sexually Abused: No   Housing Stability: Low Risk  (05/05/2021)    Housing Stability Vital Sign     Unable to Pay for Housing in the Last Year: No     Number of Places Lived in the Last Year: 1     Unstable Housing in the Last Year: No     Current Outpatient Medications   Medication Sig Dispense Refill    amoxicillin (AMOXIL) 500 MG capsule Take 1 capsule by mouth 2 (two) times daily For 7 days. Currently on 3rd day.      drospirenone-ethinyl estradiol (YASMIN) 3-0.03 MG per tablet Take 1 tablet by mouth daily      levothyroxine (SYNTHROID) 125 MCG tablet Take 1 tablet (125 mcg) by mouth daily 90 tablet 0    drospirenone-ethinyl estradiol ([name]) 3-0.02 MG per tablet Take 1 tablet by mouth daily 84 tablet 1     No current facility-administered medications for this visit.     No Known Allergies      BP (!) 137/98 (BP Site: Left arm)   Pulse 88   Temp 98.3 F (36.8 C)   Ht 1.702 m (5\' 7" )   Wt 81.2 kg (179 lb 1.6 oz)   LMP 04/28/2021 (Approximate)   SpO2 95%   BMI 28.05 kg/m     Objective:   Physical Exam  Vitals reviewed.  Constitutional:       Appearance: She is well-developed.   HENT:      Head: Normocephalic and atraumatic.      Right Ear: Hearing, tympanic membrane and external ear normal.      Left Ear: Hearing, tympanic membrane and external ear normal.      Nose: Nose normal.      Mouth/Throat:      Pharynx: No oropharyngeal exudate.   Eyes:      Conjunctiva/sclera: Conjunctivae normal.      Pupils: Pupils are equal, round, and reactive to light.   Neck:      Thyroid: No thyromegaly.   Cardiovascular:      Rate and Rhythm: Normal rate and regular rhythm.      Heart sounds: Normal heart sounds. No murmur heard.  Pulmonary:      Effort: Pulmonary effort is normal. No respiratory distress.      Breath sounds: Normal breath sounds.   Abdominal:      General: Bowel sounds are normal. There is no distension.      Palpations: Abdomen is soft. There is no hepatomegaly, splenomegaly or mass.      Tenderness: There is no abdominal tenderness. There is no guarding or rebound.   Musculoskeletal:         General: No tenderness. Normal range of motion.      Cervical back: Normal range of motion and neck supple.   Lymphadenopathy:      Cervical: No cervical adenopathy.      Upper Body:      Right upper body: No supraclavicular adenopathy.      Left upper body: No supraclavicular adenopathy.   Skin:     General: Skin is warm and dry.      Findings: No rash.   Neurological:      Mental Status: She is alert and oriented to person, place, and time.         Assessment:     1. Routine history and physical examination of adult  CBC without differential    Comprehensive metabolic panel    Lipid panel      2. Acquired hypothyroidism  levothyroxine (SYNTHROID) 125 MCG tablet    TSH    T4, free      3. DDD (degenerative disc disease), lumbar        4. PCOS (polycystic ovarian syndrome)        5. Encounter for lipid screening for cardiovascular disease  Lipid panel      6. Need for hepatitis C screening  test  Hepatitis C (HCV) antibody, Total      7. Encounter for screening mammogram for high-risk patient  Mammo Digital 2D Screening Bilateral W CAD      8. Cold sore  valACYclovir (Valtrex) 1000 MG tablet      9. Nonintractable episodic headache, unspecified headache type  MRI brain without contrast            Plan:     41 y.o. female presents for physical and fasting blood work    1.Discussed the patient's BMI with her.  Body mass index is 28.05 kg/m.   HIgh BMI Follow Up  BMI Follow Up Care Plan Documented  Encouragement to Exercise  Nutrition and Physical Activity Counseling:  Nutrition Counseling:  Food education, guidance and counseling  Physical Activity Counseling  Patient advised about exercise    2. Counseling: Counseling/Anticipatory Guidance (40-64): Nutrition, physical activity, healthy weight, injury prevention, misuse of tobacco,  alcohol and drugs, sexual behavior and STDs, contraception, dental health, mental health, immunizations, screenings.     3. Hypothyroidism: hashimoto's, TSH was a little higher ~5 last year. Has persistent fatigue that may be symptomatic, uncontrolled hypothyroidism. Levothyroxine increased to last year.   -thyroid US in 2 years, reviewed last [place] 12/2019  -adjust levothyroxine if needed     4. Headaches: has always been during period, but now also happening outside of periods. Does meet criteria for migraine  -reassured  -MRI ordered given change   -counseled on migraines, diagnosis, treatment options, triggers, prevention, headache diary, etc  -treat in step-wise fashion  -start with excedrin at the first sign of migraine  -if does not control, will consider trial of triptan such as imitrex or maxalt  -no indication for prophylactic medication such as topamax for now, but counseled that this is an option if needed in the future  -counseled on signs/symptoms to warrant re-evaluation  Especially red flags.     5. Borderline BP:   -monitor for now, follow up at next  physical in a few months.     6. Back pain: chronic, manageable.   -stable  -continue supportive care    Health maintenance:  -Fasting blood work: today, reviewed planned labs and pt agrees to them.   -Immunizations: UTD  -STD screen: declined  -Derm: UTD  -Pap: UTD  -Mammogram: UTD  -DEXA: due at 57  -Colonoscopy: due at 51  -Vision: UTD  -Dental: UTD  -Advanced directive: discuss at future visit     Return in about 2 months (around 07/06/2021) for follow up current symptoms.     Gust Brooms, MD

## 2021-05-06 NOTE — Progress Notes (Signed)
Have you seen any specialists/other providers since your last visit with us?    Yes  GYNO.    The patient was informed that the following HM items are still outstanding:   Health Maintenance Due   Topic Date Due    HEPATITIS C SCREENING  Never done

## 2021-05-19 NOTE — Progress Notes (Signed)
Pt hasn't viewed/responded to mychart result message. Please call and discuss with pt.

## 2021-05-27 ENCOUNTER — Encounter (INDEPENDENT_AMBULATORY_CARE_PROVIDER_SITE_OTHER): Payer: Self-pay

## 2021-07-08 ENCOUNTER — Encounter (INDEPENDENT_AMBULATORY_CARE_PROVIDER_SITE_OTHER): Payer: Self-pay | Admitting: Internal Medicine

## 2021-07-08 ENCOUNTER — Ambulatory Visit (INDEPENDENT_AMBULATORY_CARE_PROVIDER_SITE_OTHER): Payer: PRIVATE HEALTH INSURANCE | Admitting: Internal Medicine

## 2021-07-08 VITALS — BP 116/82 | HR 81 | Temp 98.0°F | Ht 67.0 in | Wt 160.9 lb

## 2021-07-08 DIAGNOSIS — L739 Follicular disorder, unspecified: Secondary | ICD-10-CM

## 2021-07-08 DIAGNOSIS — E039 Hypothyroidism, unspecified: Secondary | ICD-10-CM

## 2021-07-08 LAB — TSH: TSH: 0.02 u[IU]/mL — ABNORMAL LOW (ref 0.35–4.94)

## 2021-07-08 LAB — T4, FREE: T4 Free: 1.28 ng/dL (ref 0.69–1.48)

## 2021-07-08 MED ORDER — SULFAMETHOXAZOLE-TRIMETHOPRIM 800-160 MG PO TABS
1.0000 | ORAL_TABLET | Freq: Two times a day (BID) | ORAL | 0 refills | Status: AC
Start: 2021-07-08 — End: 2021-07-13

## 2021-07-08 NOTE — Progress Notes (Signed)
Have you seen any specialists/other providers since your last visit with [place]?    Yes  [facility]  Eye doctor.     The patient was informed that the following HM items are still outstanding:   There are no preventive care reminders to display for this patient.

## 2021-07-08 NOTE — Progress Notes (Signed)
Subjective:      Patient ID: Peggy Kennedy is a 41 y.o. female  Chief Complaint   Patient presents with    Headache     Switched birth control and since switching her headaches are much better. Shes feels good.     Mass     A month old, red, swollen, not hot to the touch. And is located on left bikini line.          HPI    Changed OCP with GYN and feels better. Gets a little headaches with period, but not to the level of migraine and so feels better. Didn't get MRI, since headaches are better, would like to hold off.     Has a painful lump on left bikini light. Doesn't feel like it's getting better.     Problem   Acquired Hypothyroidism    Has Hashimoto's thyroiditis, has hypothyroidism. Takes levothyroxine . Since Peggy Kennedy had son who is 2.5yo it has been all over the place. Has felt it was messed up since then. Would like to recheck. Also feels that there's something in her throat.     Since last visit: TSH was a little higher than goal. Would like to recheck.     Lab Results   Component Value Date    TSH 3.63 05/06/2021    T4FREE 1.12 05/06/2021     Lab Results   Component Value Date    TSH 3.11 05/01/2020    T4FREE 1.18 05/01/2020        Lab Results   Component Value Date    TSH 1.53 11/11/2018    T4FREE 1.19 11/11/2018            The following portions of the patient's history were reviewed and updated as appropriate: current medications, allergies, past family history, past medical history, past social history, past surgical history and problem list.     Review of Systems   Constitutional: Negative.    Respiratory: Negative.     Cardiovascular: Negative.    Neurological:  Negative for headaches.         BP 116/82 (BP Site: Left arm, Patient Position: Sitting, Cuff Size: Small)   Pulse 81   Temp 98 F (36.7 C)   Ht 1.702 m (5\' 7" )   Wt 73 kg (160 lb 14.4 oz)   LMP 06/23/2021 (Approximate)   SpO2 98%   BMI 25.20 kg/m    Objective:   Physical Exam  Vitals and nursing note  reviewed.   Constitutional:       General: Peggy Kennedy is not in acute distress.     Appearance: Peggy Kennedy is well-developed.   HENT:      Head: Normocephalic.   Pulmonary:      Effort: Pulmonary effort is normal.   Genitourinary:      Neurological:      Mental Status: Peggy Kennedy is alert. Mental status is at baseline.   Psychiatric:         Mood and Affect: Mood and affect normal.         Assessment:     1. Folliculitis  sulfamethoxazole-trimethoprim (Bactrim DS) 800-160 MG per tablet      2. Acquired hypothyroidism  TSH    T4, free            Plan:     41 y.o. female presents for follow up.     1. Abscess/folliculitis: no significant cellulitis, not spontaneously draining  -counseled on possibly self  limited nature  -bactrim x 5  -warm compresses/baths  -tylenol/ibuprofen prn  -Risk & Benefits of the new medication(s) were explained to the patient (and family) who verbalized understanding & agreed to the treatment plan. Patient (family) encouraged to contact me/clinical staff with any questions/concerns  -counseled on signs/symptoms to warrant re-evaluation. If persists or worsens, may need I&D, would recommend to see GYN given location.     2. Hypothyroidism: TSH higher than goal  -recheck TFTs  -goal TSH 1-2, if still elevated, consider increasing dose.   -counseled on signs/symptoms to warrant re-evaluation   -thyroid US in 2 years, reviewed last US 12/2019    3. Headaches: menstrual, improved with OCP changed  -reassured  -MRI ordered given change, pt would like to hold for now since improved   -counseled on migraines, diagnosis, treatment options, triggers, prevention, headache diary, etc  -treat in step-wise fashion  -start with excedrin at the first sign of migraine  -if does not control, will consider trial of triptan such as imitrex or maxalt  -no indication for prophylactic medication such as topamax for now, but counseled that this is an option if needed in the future  -counseled on signs/symptoms to warrant re-evaluation   Especially red flags.     4. Borderline BP: improved on recheck  -monitor for now, follow up at next physical in a few months.     Did not improve:   5. Back pain: chronic, manageable.   -stable  -continue supportive care    Health maintenance:  -Fasting blood work: today, reviewed planned labs and pt agrees to them.   -Immunizations: UTD  -STD screen: declined  -Derm: UTD  -Pap: UTD  -Mammogram: UTD  -DEXA: due at 6365  -Colonoscopy: due at 5845  -Vision: UTD  -Dental: UTD  -Advanced directive: discuss at future visit     Return for follow up current symptoms.    Gust BroomsMiriam Saad Maryana Pittmon, MD

## 2021-07-09 NOTE — Addendum Note (Signed)
Addended by: Mariann Laster on: 07/09/2021 07:52 AM     Modules accepted: Orders

## 2021-08-09 ENCOUNTER — Other Ambulatory Visit (INDEPENDENT_AMBULATORY_CARE_PROVIDER_SITE_OTHER): Payer: Self-pay | Admitting: Internal Medicine

## 2021-08-09 DIAGNOSIS — E039 Hypothyroidism, unspecified: Secondary | ICD-10-CM

## 2021-08-11 NOTE — Telephone Encounter (Signed)
Last filled March 2023.   Last o/v May 2023.  Patient does not have an upcoming appointment  Queued up 90 with 0 refills.

## 2021-10-07 ENCOUNTER — Encounter (INDEPENDENT_AMBULATORY_CARE_PROVIDER_SITE_OTHER): Payer: Self-pay

## 2021-10-17 ENCOUNTER — Other Ambulatory Visit (INDEPENDENT_AMBULATORY_CARE_PROVIDER_SITE_OTHER): Payer: Self-pay | Admitting: Internal Medicine

## 2021-10-23 ENCOUNTER — Other Ambulatory Visit (INDEPENDENT_AMBULATORY_CARE_PROVIDER_SITE_OTHER): Payer: Self-pay | Admitting: Internal Medicine

## 2021-10-23 DIAGNOSIS — B001 Herpesviral vesicular dermatitis: Secondary | ICD-10-CM

## 2021-10-24 NOTE — Telephone Encounter (Signed)
Refill sent.

## 2021-10-31 ENCOUNTER — Other Ambulatory Visit (INDEPENDENT_AMBULATORY_CARE_PROVIDER_SITE_OTHER): Payer: Self-pay | Admitting: Internal Medicine

## 2021-10-31 DIAGNOSIS — E039 Hypothyroidism, unspecified: Secondary | ICD-10-CM

## 2021-10-31 NOTE — Telephone Encounter (Signed)
Refill sent.

## 2021-11-03 ENCOUNTER — Encounter (INDEPENDENT_AMBULATORY_CARE_PROVIDER_SITE_OTHER): Payer: Self-pay | Admitting: Internal Medicine

## 2021-11-03 ENCOUNTER — Telehealth (INDEPENDENT_AMBULATORY_CARE_PROVIDER_SITE_OTHER): Payer: PRIVATE HEALTH INSURANCE | Admitting: Internal Medicine

## 2021-11-03 DIAGNOSIS — M542 Cervicalgia: Secondary | ICD-10-CM

## 2021-11-03 DIAGNOSIS — E282 Polycystic ovarian syndrome: Secondary | ICD-10-CM

## 2021-11-03 DIAGNOSIS — G44209 Tension-type headache, unspecified, not intractable: Secondary | ICD-10-CM

## 2021-11-03 DIAGNOSIS — R030 Elevated blood-pressure reading, without diagnosis of hypertension: Secondary | ICD-10-CM

## 2021-11-03 MED ORDER — CYCLOBENZAPRINE HCL 5 MG PO TABS
2.5000 mg | ORAL_TABLET | Freq: Every evening | ORAL | 0 refills | Status: AC | PRN
Start: 2021-11-03 — End: 2021-11-13

## 2021-11-03 MED ORDER — DROSPIRENONE-ETHINYL ESTRADIOL 3-0.03 MG PO TABS
1.0000 | ORAL_TABLET | Freq: Every day | ORAL | 1 refills | Status: DC
Start: 2021-11-03 — End: 2022-04-20

## 2021-11-03 NOTE — Progress Notes (Signed)
**Note Kennedy-Identified via Obfuscation** Subjective:      Patient ID: Peggy Kennedy  is a 41 y.o. female  Chief Complaint   Patient presents with    Neck Pain     Elevated Bp follow up and has been doing it and around 140's/ 90's 100's. Comes and goes and turns into a headache ongoing Sx for a week. Pain radiates into spine she states. States it might be the muscle. Nothing otc seems to help.     Hypertension      VIDEO VISIT  Verbal consent has been obtained from the patient to conduct a video and telephone visit to minimize exposure to COVID-19: yes      Neck Pain   This is a new problem. The current episode started 1 to 4 weeks ago (2 weeks). The problem has been waxing and waning. The pain is associated with nothing. The pain is present in the right side. The quality of the pain is described as aching and shooting. The pain is moderate. The symptoms are aggravated by position. Associated symptoms include headaches. She has tried home exercises (posture) for the symptoms.     Has a long history of neck pain, but never really bothered her. She does have hx of lumbar spine problem that needs eventual surgery.  But in the past 2 weeks will get more cervical spine pain. It's slightly to the right. Feels she gets sudden pain in the neck and then drives a sharp pain, headaches/right eye pain. Rest, massage does help. But sudden movement will feel it again.     Blood pressure was high on previous visits. Got home machine and has been checking it. Has been high. Has never checked it and it was 120/80, which used to be her normal. Now more like 130-140/80-90.     Also, can't get in with GYN, but doesn't like current OCP and would like to change back to her previous yasmin    The following portions of the patient's history were reviewed and updated as appropriate: current medications, allergies, past family history, past medical history, past social history, past surgical history and problem list.     Review of Systems   Musculoskeletal:   Positive for neck pain.   Neurological:  Positive for headaches.         There were no vitals taken for this visit. VIDEO VISIT  Objective:   Physical Exam    Pt unable to connect video    BP Readings from Last 3 Encounters:   07/08/21 116/82   05/06/21 (!) 137/98   01/22/21 129/88      Assessment:     1. Neck pain  cyclobenzaprine (FLEXERIL) 5 MG tablet      2. Tension headache  cyclobenzaprine (FLEXERIL) 5 MG tablet      3. Elevated blood pressure reading        4. PCOS (polycystic ovarian syndrome)  drospirenone-ethinyl estradiol (YASMIN) 3-0.03 MG per tablet            Plan:     41 y.o. female presents for the following:     1. Neck pain likely due to muscle spasm causing Tension headaches . No red flags.  -counseled on most likely self limited nature  -Recommend heat with heat pack or hot baths/showers.  -Stretching exercises and massage   -NSAIDs prn.  -flexeril qhs prn  -Risk & Benefits of the new medication(s) were explained to the patient (and family) who verbalized understanding & agreed to the  treatment plan. Patient (family) encouraged to contact me/clinical staff with any questions/concerns  -Recommend to follow up if no improvement in 2-3 weeks and would consider physical therapy referral.  -counseled on signs/symptoms to warrant re-evaluation     2. Elevated blood pressure: possibly new diagnosis HTN vs elevation due to recent neck pain/headaches   -discussed in detail  -monitor home bp readings and send results in 1 month  -if averaging >140/90, consider initiation of lisinopril 5 mg  -counseled on signs/symptoms to warrant re-evaluation     3. PCOS: not liking current OCP, S[facility], would like to go back to yasmin  -yasmin Rx sent  -follow up with gyn next available    Return in about 4 weeks (around 12/01/2021).    Gust Brooms, MD     Time: 19 min

## 2021-11-03 NOTE — Progress Notes (Signed)
Have you seen any specialists/other providers since your last visit with us?    No      The patient was informed that the following HM items are still outstanding:   Health Maintenance Due   Topic Date Due    INFLUENZA VACCINE  09/09/2021

## 2021-12-01 ENCOUNTER — Ambulatory Visit (INDEPENDENT_AMBULATORY_CARE_PROVIDER_SITE_OTHER): Payer: PRIVATE HEALTH INSURANCE | Admitting: Internal Medicine

## 2021-12-03 ENCOUNTER — Encounter (INDEPENDENT_AMBULATORY_CARE_PROVIDER_SITE_OTHER): Payer: Self-pay | Admitting: Internal Medicine

## 2021-12-03 ENCOUNTER — Ambulatory Visit (INDEPENDENT_AMBULATORY_CARE_PROVIDER_SITE_OTHER): Payer: PRIVATE HEALTH INSURANCE | Admitting: Internal Medicine

## 2021-12-03 VITALS — BP 142/91 | HR 85 | Temp 98.2°F | Resp 16 | Ht 67.0 in | Wt 177.8 lb

## 2021-12-03 DIAGNOSIS — Z23 Encounter for immunization: Secondary | ICD-10-CM

## 2021-12-03 DIAGNOSIS — Z8249 Family history of ischemic heart disease and other diseases of the circulatory system: Secondary | ICD-10-CM

## 2021-12-03 DIAGNOSIS — E7800 Pure hypercholesterolemia, unspecified: Secondary | ICD-10-CM

## 2021-12-03 DIAGNOSIS — E039 Hypothyroidism, unspecified: Secondary | ICD-10-CM

## 2021-12-03 DIAGNOSIS — E663 Overweight: Secondary | ICD-10-CM

## 2021-12-03 DIAGNOSIS — Z6827 Body mass index (BMI) 27.0-27.9, adult: Secondary | ICD-10-CM | POA: Insufficient documentation

## 2021-12-03 DIAGNOSIS — Z0000 Encounter for general adult medical examination without abnormal findings: Secondary | ICD-10-CM

## 2021-12-03 DIAGNOSIS — I10 Essential (primary) hypertension: Secondary | ICD-10-CM | POA: Insufficient documentation

## 2021-12-03 DIAGNOSIS — M542 Cervicalgia: Secondary | ICD-10-CM

## 2021-12-03 HISTORY — DX: Overweight: E66.3

## 2021-12-03 LAB — LIPID PANEL
Cholesterol / HDL Ratio: 2.7 Index
Cholesterol: 172 mg/dL (ref 0–199)
HDL: 63 mg/dL (ref 40–9999)
LDL Calculated: 97 mg/dL (ref 0–99)
Triglycerides: 59 mg/dL (ref 34–149)
VLDL Calculated: 12 mg/dL (ref 10–40)

## 2021-12-03 LAB — TSH: TSH: 7.31 u[IU]/mL — ABNORMAL HIGH (ref 0.35–4.94)

## 2021-12-03 LAB — T4, FREE: T4 Free: 1.04 ng/dL (ref 0.69–1.48)

## 2021-12-03 MED ORDER — LISINOPRIL 5 MG PO TABS
5.0000 mg | ORAL_TABLET | Freq: Every day | ORAL | 1 refills | Status: DC
Start: 2021-12-03 — End: 2022-02-27

## 2021-12-03 NOTE — Progress Notes (Signed)
Subjective:      Patient ID: Peggy Kennedy is a 41 y.o. female  Chief Complaint   Patient presents with    Hypertension     Rechecking Blood pressure.     Neck Pain     After visit does believe it was a pulled muscle     Thyroid Problem     Wants lab work to check statis of Hypothyroidism          HPI    Here to follow up last televisit.   Does think neck pain is muscular. Does also get headaches.   Also notes a tender spot under her jaw for the past few days. No masses. Not sure if it's related to teeth or gum and does have some inflamed gum on that side.     Problem   Primary Hypertension    Likely new diagnosis.   Got home machine and has checked the readings. Tends to be 130-140s/90s.   Does have family history. Dad diagnosed in his 29s, mom also and was diagnosed in 18s. Brother in his 107s also diagnosed.     BP Readings from Last 3 Encounters:   12/03/21 (!) 143/99   07/08/21 116/82   05/06/21 (!) 137/98         Overweight With Body Mass Index (Bmi) of 27 to 27.9 in Adult   Acquired Hypothyroidism    Has Hashimoto's thyroiditis, has hypothyroidism. Takes levothyroxine . Since she had son who is 2.5yo it has been all over the place. Has felt it was messed up since then. Would like to recheck. Also feels that there's something in her throat.     Since last visit: TSH low last check. Would like to recheck.     Lab Results   Component Value Date    TSH 0.02 (L) 07/08/2021    T4FREE 1.28 07/08/2021     Lab Results   Component Value Date    TSH 0.02 (L) 07/08/2021    T4FREE 1.28 07/08/2021        Lab Results   Component Value Date    TSH 0.02 (L) 07/08/2021    T4FREE 1.28 07/08/2021            The following portions of the patient's history were reviewed and updated as appropriate: current medications, allergies, past family history, past medical history, past social history, past surgical history and problem list.     Review of Systems   Constitutional: Negative.    Respiratory:  Negative.     Cardiovascular: Negative.          BP (!) 142/91 Comment: 2nd with our machine.  Pulse 85   Temp 98.2 F (36.8 C)   Resp 16   Ht 1.702 m (5\' 7" )   Wt 80.6 kg (177 lb 12.8 oz)   LMP 12/03/2021   SpO2 99%   BMI 27.85 kg/m    Objective:   Physical Exam  Vitals and nursing note reviewed.   Constitutional:       General: She is not in acute distress.     Appearance: She is well-developed.   HENT:      Mouth/Throat:      Mouth: Oral lesions (ulcer seen on inner right cheek) present.   Eyes:      Conjunctiva/sclera: Conjunctivae normal.   Neck:      Thyroid: No thyroid mass or thyromegaly.     Cardiovascular:      Rate and  Rhythm: Normal rate and regular rhythm.      Heart sounds: Normal heart sounds.   Pulmonary:      Effort: Pulmonary effort is normal.      Breath sounds: Normal breath sounds.   Musculoskeletal:      Cervical back: Muscular tenderness present. No spinous process tenderness. Normal range of motion.   Lymphadenopathy:      Cervical: No cervical adenopathy.   Neurological:      General: No focal deficit present.      Mental Status: She is alert.         Assessment:     1. Acquired hypothyroidism  T4, free    TSH      2. Primary hypertension  lisinopril (ZESTRIL) 5 MG tablet    Cardiology Referral: Mal Amabile, MD [facility])      3. Family history of heart disease  lisinopril (ZESTRIL) 5 MG tablet    Cardiology Referral: Mal Amabile, MD [facility])      4. Neck pain        5. Pure hypercholesterolemia  Lipid panel      6. Overweight with body mass index (BMI) of 27 to 27.9 in adult              Plan:     41 y.o. female presents for the following:     1. Neck pain likely due to muscle spasm causing Tension headaches . No red flags.  -counseled on most likely self limited nature  -Recommend heat with heat pack or hot baths/showers.  -Stretching exercises and massage   -NSAIDs prn.  -flexeril qhs prn  -Risk & Benefits of the new medication(s) were explained to the patient (and  family) who verbalized understanding & agreed to the treatment plan. Patient (family) encouraged to contact me/clinical staff with any questions/concerns  -Recommend to follow up if no improvement in 2-3 weeks and would consider physical therapy referral.  -counseled on signs/symptoms to warrant re-evaluation     2. HTN: new diagnosis, family history  -start lisinopril 5mg  daily  -Risk & Benefits of the new medication(s) were explained to the patient (and family) who verbalized understanding & agreed to the treatment plan. Patient (family) encouraged to contact me/clinical staff with any questions/concerns  -continue home bp log and bring to next visit  -confirmed home bp monitor today, seems to be a bit higher with home machine  -continue low salt, healthy diet and maintain healthy weight  -refer to cardio per request given fam hx  -counseled on signs/symptoms to warrant re-evaluation     3. Hypothyroidism: last TSH low  -recheck TSH and adjust levothyroxine if needed .    4. Overweight and difficulty losing weight.   -labs as ordered to rule out medical cause such as uncontrolled hypothyroidism  -discussed options for weight loss at length  -gave info on local medical weight loss programs  -could consider GLP-1 in the future, but advised 3-6 monthsformal weight loss program to ensure insurance coverage  -counseled on low carb/keto/paleo/weight watchers, etc as options  -recommend 177min/week exercise  -counseled on signs/symptoms to warrant re-evaluation     Did not address in detail today:   5. PCOS: not liking current OCP, S[facility], would like to go back to yasmin  -yasmin Rx sent  -follow up with gyn next available    Flu shot today    Return in about 2 months (around 02/02/2022) for follow up bp.    Gust Brooms, MD

## 2021-12-03 NOTE — Progress Notes (Signed)
Have you seen any specialists/other providers since your last visit with us?    No      The patient was informed that the following HM items are still outstanding:   Health Maintenance Due   Topic Date Due    INFLUENZA VACCINE  09/09/2021

## 2021-12-04 ENCOUNTER — Encounter (INDEPENDENT_AMBULATORY_CARE_PROVIDER_SITE_OTHER): Payer: Self-pay

## 2021-12-04 MED ORDER — LEVOTHYROXINE SODIUM 137 MCG PO TABS
137.0000 ug | ORAL_TABLET | Freq: Every day | ORAL | 0 refills | Status: DC
Start: 2021-12-04 — End: 2022-01-19

## 2022-01-12 ENCOUNTER — Ambulatory Visit (INDEPENDENT_AMBULATORY_CARE_PROVIDER_SITE_OTHER): Payer: PRIVATE HEALTH INSURANCE | Admitting: Interventional Cardiology

## 2022-01-12 ENCOUNTER — Encounter (INDEPENDENT_AMBULATORY_CARE_PROVIDER_SITE_OTHER): Payer: Self-pay | Admitting: Interventional Cardiology

## 2022-01-12 VITALS — BP 130/94 | HR 97 | Ht 68.0 in | Wt 177.0 lb

## 2022-01-12 DIAGNOSIS — Z136 Encounter for screening for cardiovascular disorders: Secondary | ICD-10-CM

## 2022-01-12 DIAGNOSIS — I10 Essential (primary) hypertension: Secondary | ICD-10-CM

## 2022-01-12 DIAGNOSIS — Z8249 Family history of ischemic heart disease and other diseases of the circulatory system: Secondary | ICD-10-CM

## 2022-01-12 DIAGNOSIS — E282 Polycystic ovarian syndrome: Secondary | ICD-10-CM

## 2022-01-12 NOTE — Patient Instructions (Addendum)
I would continue Lisinopril at current dose.  If blood pressure is consistently lower than 110/60 mm Hg time to decrease medication dose.  If blood pressure is consistently above 140/80 mm Hg time to increase medication dose.    We discussed adding in regular exercise of cardiac type.    Please schedule an ECHOCARDIOGRAM (heart ultrasound) at [facility] or [facility] office.    Please schedule a Kidney artery ultrasound test at [facility] or [facility] imaging ([address]).    We would like for you to schedule a Coronary CT calcium score to know if you have heart artery plaque at younger age.  Please call [facility] in [place], [place], or [facility] in [place], [place], or Madaline Brilliant or [facility] operator or [employer] in [place], [place] to schedule this test.  You will not need an IV for this test.  It is not covered by insurance and usually costs from $100 to $300.    Return to clinic in 3 months.

## 2022-01-12 NOTE — Progress Notes (Signed)
[facility]    CC:    Chief Complaint   Patient presents with    Hypertension     Referral from Dr. Raeanne Barry for hypertension    HPI:  Peggy Kennedy is a 42 y.o. female with Hashimoto's thyroiditis, hypothyroidism, PCOS, essential HTN, family history of HTN, menstrual HA/migraine, and Lumbar DDD.      She complains of fatigue today.  She notes she is "always tired".  She also has monthly HA.  She denies shortness of breath, chest pain, chest heaviness, or chest tightness.  She does sometimes do HIIT exercises.  She did not have issues doing this but had to stop due to back pains/stiffness.    She would like to discuss her BP.  Her and PCP noted her BP higher readings in last 2 years.  In the last year she has started discussing BP medications.  Started Lisinopril 5mg  daily in October.  She is tolerating well.    She has no history of TIA, CVA, amaurosis fugax.  She has no history of HTN urgency, HTN emergency, or UC/ED visits for HTN.    She notes she had two children with help of fertility specialists.  She denies history of pre-eclampsia, eclampsia, or toxemia.    She played volleyball into her 20's and did not have an issue with exertional symptoms.    Life-long non-smoker  Fam Hx   Father - HTN at young age, CAD / CABG at 68 years, gastric cancer that took his life, CVA  Mother - HTN  Sister - HTN    Past Med Hx:   Active Ambulatory Problems     Diagnosis Date Noted    Acquired hypothyroidism 07/03/2018    DDD (degenerative disc disease), lumbar 07/03/2018    PCOS (polycystic ovarian syndrome) 08/09/2018    Primary hypertension 12/03/2021    Overweight with body mass index (BMI) of 27 to 27.9 in adult 12/03/2021    Family history of coronary artery disease in father 01/12/2022     Resolved Ambulatory Problems     Diagnosis Date Noted    No Resolved Ambulatory Problems     Past Medical History:   Diagnosis Date    Back pain     Hypothyroid        Surg Hx:   Past Surgical  History:   Procedure Laterality Date    CESAREAN SECTION  02/2017    csection  2014       Fam Hx:    Family History   Problem Relation Age of Onset    Hypertension Mother     Hypertension Father     Cancer Father         stomach cancer    Breast cancer Paternal Aunt        Soc Hx:    Social History     Socioeconomic History    Marital status: Married     Spouse name: None    Number of children: None    Years of education: None    Highest education level: None   Occupational History    None   Tobacco Use    Smoking status: Never    Smokeless tobacco: Never   Vaping Use    Vaping Use: Never used   Substance and Sexual Activity    Alcohol use: Yes     Comment: occassionally    Drug use: Never    Sexual activity: Yes  Partners: Male     Birth control/protection: Pill     Comment: [name]   Other Topics Concern    None   Social History Narrative    None     Social Determinants of Health     Financial Resource Strain: Low Risk  (11/02/2021)    Overall Financial Resource Strain (CARDIA)     Difficulty of Paying Living Expenses: Not very hard   Food Insecurity: No Food Insecurity (11/02/2021)    Hunger Vital Sign     Worried About Running Out of Food in the Last Year: Never true     Ran Out of Food in the Last Year: Never true   Transportation Needs: No Transportation Needs (11/02/2021)    PRAPARE - Therapist, art (Medical): No     Lack of Transportation (Non-Medical): No   Physical Activity: Insufficiently Active (01/09/2022)    Exercise Vital Sign     Days of Exercise per Week: 2 days     Minutes of Exercise per Session: 30 min   Stress: No Stress Concern Present (11/02/2021)    [employer] of Occupational Health - Occupational Stress Questionnaire     Feeling of Stress : Only a little   Social Connections: Socially Integrated (11/02/2021)    Social Connection and Isolation Panel [NHANES]     Frequency of Communication with Friends and Family: More than three times a week     Frequency of  Social Gatherings with Friends and Family: More than three times a week     Attends Religious Services: More than 4 times per year     Active Member of [employer] or Organizations: Yes     Attends Engineer, structural: More than 4 times per year     Marital Status: Married   Catering manager Violence: Not At Risk (11/02/2021)    Humiliation, Afraid, Rape, and Kick questionnaire     Fear of Current or Ex-Partner: No     Emotionally Abused: No     Physically Abused: No     Sexually Abused: No   Housing Stability: Low Risk  (11/02/2021)    Housing Stability Vital Sign     Unable to Pay for Housing in the Last Year: No     Number of Places Lived in the Last Year: 1     Unstable Housing in the Last Year: No       Current Meds:   Current Outpatient Medications:     drospirenone-ethinyl estradiol (YASMIN) 3-0.03 MG per tablet, Take 1 tablet by mouth daily, Disp: 90 tablet, Rfl: 1    levothyroxine (SYNTHROID) 137 MCG tablet, Take 1 tablet (137 mcg) by mouth daily, Disp: 90 tablet, Rfl: 0    lisinopril (ZESTRIL) 5 MG tablet, Take 1 tablet (5 mg) by mouth daily, Disp: 90 tablet, Rfl: 1    Allergies:  No Known Allergies      Physical Exam:  BP (!) 130/94 (BP Site: Left arm, Patient Position: Sitting)   Pulse 97   Ht 1.727 m (5\' 8" )   Wt 80.3 kg (177 lb)   SpO2 97%   BMI 26.91 kg/m     Physical Exam  Vitals reviewed.   Constitutional:       General: She is not in acute distress.     Appearance: She is well-developed. She is not diaphoretic.   HENT:      Head: Normocephalic and atraumatic.      Right Ear:  External ear normal.      Left Ear: External ear normal.      Nose: Nose normal. No congestion.      Mouth/Throat:      Mouth: Mucous membranes are moist.      Pharynx: Oropharynx is clear. No oropharyngeal exudate.   Eyes:      General: No scleral icterus.        Right eye: No discharge.         Left eye: No discharge.      Conjunctiva/sclera: Conjunctivae normal.      Pupils: Pupils are equal, round, and reactive to  light.   Neck:      Thyroid: No thyromegaly.      Vascular: No carotid bruit or JVD.      Trachea: No tracheal deviation.   Cardiovascular:      Rate and Rhythm: Normal rate and regular rhythm. No extrasystoles are present.     Chest Wall: PMI is not displaced.      Pulses:           Carotid pulses are 2+ on the right side and 2+ on the left side.       Radial pulses are 2+ on the right side and 2+ on the left side.        Femoral pulses are 2+ on the right side and 2+ on the left side.       Popliteal pulses are 2+ on the right side and 2+ on the left side.        Dorsalis pedis pulses are 2+ on the right side and 2+ on the left side.        Posterior tibial pulses are 1+ on the right side and 1+ on the left side.      Heart sounds: Normal heart sounds, S1 normal and S2 normal. No murmur heard.     No friction rub. No gallop.   Pulmonary:      Effort: Pulmonary effort is normal. No respiratory distress.      Breath sounds: Normal breath sounds. No stridor. No wheezing, rhonchi or rales.   Chest:      Chest wall: No tenderness.   Abdominal:      General: Bowel sounds are normal. There is no distension.      Palpations: Abdomen is soft.      Tenderness: There is no abdominal tenderness. There is no rebound.   Genitourinary:     Comments: deferred  Musculoskeletal:         General: No tenderness or deformity. Normal range of motion.      Cervical back: Normal range of motion and neck supple.      Right lower leg: No edema.      Left lower leg: No edema.   Lymphadenopathy:      Cervical: No cervical adenopathy.   Skin:     General: Skin is warm.      Coloration: Skin is not jaundiced or pale.      Findings: No bruising, erythema, lesion or rash.   Neurological:      General: No focal deficit present.      Mental Status: She is alert and oriented to person, place, and time. Mental status is at baseline.      Cranial Nerves: No cranial nerve deficit.      Motor: No weakness.      Coordination: Coordination normal.       Gait: Gait normal.  Psychiatric:         Behavior: Behavior normal.         Thought Content: Thought content normal.         Judgment: Judgment normal.       LABS:  Lab Results   Component Value Date    WBC 6.27 05/06/2021    HGB 13.3 05/06/2021    HCT 39.7 05/06/2021    MCV 84.6 05/06/2021    PLT 232 05/06/2021       No results found for: "BNP"    '  Chemistry        Component Value Date/Time    NA 140 05/06/2021 1040    K 4.7 05/06/2021 1040    CL 107 05/06/2021 1040    CO2 25 05/06/2021 1040    BUN 12.0 05/06/2021 1040    CREAT 0.8 05/06/2021 1040    GLU 85 05/06/2021 1040        Component Value Date/Time    CA 9.0 05/06/2021 1040    ALKPHOS 60 05/06/2021 1040    AST 23 05/06/2021 1040    ALT 24 05/06/2021 1040    BILITOTAL 0.3 05/06/2021 1040            Lab Results   Component Value Date    CHOL 172 12/03/2021    CHOL 188 05/06/2021    CHOL 159 01/22/2021     Lab Results   Component Value Date    HDL 63 12/03/2021    HDL 53 05/06/2021    HDL 56 01/22/2021     Lab Results   Component Value Date    LDL 97 12/03/2021    LDL 118 (H) 05/06/2021    LDL 82 01/22/2021     Lab Results   Component Value Date    TRIG 59 12/03/2021    TRIG 83 05/06/2021    TRIG 107 01/22/2021       Lab Results   Component Value Date    TSH 7.31 (H) 12/03/2021       No results found for: "CRP"    DIAGNOSTIC STUDIES:  ECHO:  None on file.    ECG (personally reviewed this visit):  SR, rate 86 bpm, horizontal axis, PRWP, no ST-T abnormalities.    IMAGING:  Transvaginal Duplex (12/15/2019):  FINDINGS:  Measurements:  Uterus: 7.4 x 3.2 x 4.2 cm  Endometrium (bilayer thickness): 0.3 cm  Right ovary: 2.0 x 1.5 x 1.2 cm  Left ovary: 1.7 x 1.3 x 1.5 cm     No fibroids or other myometrial abnormalities are visualized.      The double layer endometrial thickness is normal. No focal endometrial abnormality is seen.      Ovarian texture is normal. No suspicious ovarian or adnexal masses are identified. No abnormal free fluid is seen.     IMPRESSION:    Normal examination.    Abdominal Duplex (12/15/2019):  FINDINGS:.  Directed ultrasound demonstrates segmental portions of a normal-appearing appendix which is compressible. Transverse dimension is 3 mm. There is no free fluid in the right lower quadrant. No right hydronephrosis     IMPRESSION:   Negative ultrasound for acute appendicitis    ASSESSMENT/PLAN:  Peggy Kennedy is a 41 y.o. female with     Loralie was seen today for hypertension.    Diagnoses and all orders for this visit:    Primary hypertension  -     Cardiology Referral: Mal Amabile, MD [facility])  -  CT Coronary Calcium Score; Future  -     Echo Transthoracic Adult Complete; Future  -     US Renal Artery Doppler Bilateral; Future    Family history of coronary artery disease in father  -     CT Coronary Calcium Score; Future  -     Echo Transthoracic Adult Complete; Future  -     US Renal Artery Doppler Bilateral; Future    Family history of heart disease  -     Cardiology Referral: Mal Amabile, MD [facility])  -     CT Coronary Calcium Score; Future    Screening for ischemic heart disease  -     ECG 12 lead (Normal)    PCOS (polycystic ovarian syndrome)  -     CT Coronary Calcium Score; Future  -     US Renal Artery Doppler Bilateral; Future      1) Primary HTN  I reviewed recent PCP visit notes.  I reviewed recent labs.  I reviewed prior Abdominal duplex and prior Ovarian/Endometrial ultrasound from 2021.  I reviewed ECG today with patient.  The patient is somewhat young for HTN.  She can add in regular exercise which may help significantly with the amount of medication needed to control HTN and possibly can get off medication with enough cardio-focused exercise.  We also reviewed healthy diet plan which she is already working on.  Continue Lisinopril 5mg  daily.  We will also check a resting echocardiogram with color flow Doppler to assess LV function, LV thickness, valvular function, and presence or absence on  pulmonary hypertension.  Renal arterial duplex to r/o FMD, Renal artery stenosis, and PCKD.    2) Family history of CAD in father  Pt is asymptomatic.  Encouraged regular exercise.  Testing as above.  CAC Score/scan to refine her Cardiovascular risk.    3) Screening for Ischemic heart disease  Pt concerned about her early onset HTN and her family history.  Check ECHO.  Check CAC scan.  Non-smoker.  Exercise encouraged.  Controlling BP with single medx.    4) Hx PCOS  Prior imaging of ovaries did not show ovarian cysts.  Clear association with PCOS and Cardiovascular disease including the increased risk for DM2.  The association of PCOS with HTN is questionable.  Recent Fasting bloodwork Serum Glucose was < 100 mg/dL.  She is currently OCP.  Cardiac testing as above.    RTC 3-4 months with testing.  I discussed with the patient the plan of care including risks and benefits as well as alternatives to this plan of care.    15 mins spent reviewing recent PCP office visit notes, labs, prior Abdominal imaging results, and today's ECG.  30 mins spent with patient directly.  15 mins spent charting and ordering appropriate testing and follow up.  Total time spent on visit today 60 mins.    Mal Amabile, MD, [facility]  Interventional Cardiology  [facility]  [address], [address]  [place], [place]  [address]  Fax [phone]  Phone [phone]

## 2022-01-16 ENCOUNTER — Other Ambulatory Visit (INDEPENDENT_AMBULATORY_CARE_PROVIDER_SITE_OTHER): Payer: Self-pay | Admitting: Internal Medicine

## 2022-01-16 DIAGNOSIS — E039 Hypothyroidism, unspecified: Secondary | ICD-10-CM

## 2022-01-19 MED ORDER — LEVOTHYROXINE SODIUM 137 MCG PO TABS
137.0000 ug | ORAL_TABLET | Freq: Every day | ORAL | 0 refills | Status: DC
Start: 2022-01-19 — End: 2022-02-27

## 2022-02-20 ENCOUNTER — Other Ambulatory Visit (INDEPENDENT_AMBULATORY_CARE_PROVIDER_SITE_OTHER): Payer: Self-pay | Admitting: Internal Medicine

## 2022-02-20 DIAGNOSIS — E039 Hypothyroidism, unspecified: Secondary | ICD-10-CM

## 2022-02-27 ENCOUNTER — Encounter (INDEPENDENT_AMBULATORY_CARE_PROVIDER_SITE_OTHER): Payer: Self-pay | Admitting: Internal Medicine

## 2022-02-27 ENCOUNTER — Telehealth (INDEPENDENT_AMBULATORY_CARE_PROVIDER_SITE_OTHER): Payer: No Typology Code available for payment source | Admitting: Internal Medicine

## 2022-02-27 DIAGNOSIS — E039 Hypothyroidism, unspecified: Secondary | ICD-10-CM

## 2022-02-27 DIAGNOSIS — E282 Polycystic ovarian syndrome: Secondary | ICD-10-CM

## 2022-02-27 DIAGNOSIS — Z8249 Family history of ischemic heart disease and other diseases of the circulatory system: Secondary | ICD-10-CM

## 2022-02-27 DIAGNOSIS — M5136 Other intervertebral disc degeneration, lumbar region: Secondary | ICD-10-CM

## 2022-02-27 DIAGNOSIS — Z1231 Encounter for screening mammogram for malignant neoplasm of breast: Secondary | ICD-10-CM

## 2022-02-27 DIAGNOSIS — I10 Essential (primary) hypertension: Secondary | ICD-10-CM

## 2022-02-27 MED ORDER — LISINOPRIL 5 MG PO TABS
5.0000 mg | ORAL_TABLET | Freq: Every day | ORAL | 1 refills | Status: DC
Start: 2022-02-27 — End: 2022-08-17

## 2022-02-27 MED ORDER — LEVOTHYROXINE SODIUM 137 MCG PO TABS
137.0000 ug | ORAL_TABLET | Freq: Every day | ORAL | 1 refills | Status: DC
Start: 2022-02-27 — End: 2022-06-18

## 2022-02-27 NOTE — Progress Notes (Signed)
Subjective:      Patient ID: Peggy Kennedy is a 42 y.o. female  Chief Complaint   Patient presents with    Hypothyroidism      VIDEO VISIT  Verbal consent has been obtained from the patient to conduct a video and telephone visit to minimize exposure to COVID-19: yes      HPI    Problem   Primary Hypertension    Likely new diagnosis.   Got home machine and has checked the readings. Tends to be 130-140s/90s.   Does have family history. Dad diagnosed in his 74s, mom also and was diagnosed in 43s. Brother in his 48s also diagnosed.     Since last visit: doing well. Did see cardiology and has some studies pending.     BP Readings from Last 3 Encounters:   01/12/22 (!) 130/94   12/03/21 (!) 142/91   07/08/21 116/82         Pcos (Polycystic Ovarian Syndrome)    Symptoms of acne and irregular menses controlled with change in OCP and now on yasmin. Sees GYN     Acquired Hypothyroidism    Has Hashimoto's thyroiditis, has hypothyroidism. Takes levothyroxine 169mcg. Since she had son who is 2.5yo it has been all over the place. Has felt it was messed up since then. Would like to recheck. Also feels that there's something in her throat.     Since last visit: TSH high last check. Dose increase Would like to recheck. Due for refill of the 129mcg.     Lab Results   Component Value Date    TSH 7.31 (H) 12/03/2021    T4FREE 1.04 12/03/2021     Lab Results   Component Value Date    TSH 0.02 (L) 07/08/2021    T4FREE 1.28 07/08/2021     Lab Results   Component Value Date    TSH 0.02 (L) 07/08/2021    T4FREE 1.28 07/08/2021        Lab Results   Component Value Date    TSH 0.02 (L) 07/08/2021    T4FREE 1.28 07/08/2021         Ddd (Degenerative Disc Disease), Lumbar    Feels like it continues and will likely need surgery at some point. But feels the difficulty with weight loss is not helping.     Since last visit: continues with pain. Needs to have surgery, but has to decide when it works for her schedule.            The following portions of the patient's history were reviewed and updated as appropriate: current medications, allergies, past family history, past medical history, past social history, past surgical history and problem list.     Review of Systems   Constitutional: Negative.    Respiratory: Negative.     Cardiovascular: Negative.          There were no vitals taken for this visit. VIDEO VISIT  Objective:   Physical Exam  Nursing note reviewed.   Constitutional:       General: She is not in acute distress.     Appearance: She is well-developed.   Pulmonary:      Effort: Pulmonary effort is normal.   Skin:     Findings: No rash.   Neurological:      General: No focal deficit present.      Mental Status: She is alert.         Assessment:  1. Acquired hypothyroidism  levothyroxine (SYNTHROID) 137 MCG tablet      2. Primary hypertension  lisinopril (ZESTRIL) 5 MG tablet      3. Family history of heart disease  lisinopril (ZESTRIL) 5 MG tablet      4. Encounter for screening mammogram for breast cancer        5. Encounter for screening mammogram for high-risk patient  Mammo Digital 2D Screening Bilateral W CAD      6. DDD (degenerative disc disease), lumbar        7. PCOS (polycystic ovarian syndrome)              Plan:     42 y.o. female presents for the following:     1. Neck pain: resolved  -continue good ergonomics    2. HTN: fairly new diagnosis, family history  -continue lisinopril 5mg  daily  -continue home bp log and bring to next visit  -confirmed home bp monitor at last visit, seems to be a bit higher with home machine  -continue low salt, healthy diet and maintain healthy weight  -referred to cardio per request given fam hx-work up still pending  -counseled on signs/symptoms to warrant re-evaluation     3. Hypothyroidism: last TSH high, dose increased to 174mcg  -recheck TSH and adjust levothyroxine if needed .    4. Overweight and difficulty losing weight.   -ensure thyroid is optimized  -discussed  options for weight loss at length previously  -gave info on local medical weight loss programs previously  -could consider GLP-1 in the future if BMI>27 (last was 26.9), but advised 3-6 months formal weight loss program to ensure insurance coverage  -counseled on low carb/keto/paleo/weight watchers, etc as options  -recommend 198min/week exercise  -counseled on signs/symptoms to warrant re-evaluation     5. PCOS: symptoms improved back on yasmin  -continue yasmin   -follow up with gyn next available    Return in about 6 months (around 08/28/2022) for next physical.    Brunilda Payor, MD    Time: 17 min

## 2022-02-27 NOTE — Progress Notes (Signed)
Have you seen any specialists/other providers since your last visit with us?    No      The patient was informed that the following HM items are still outstanding:   Health Maintenance Due   Topic Date Due    COVID-19 Vaccine (5 - 2023-24 season) 10/10/2021

## 2022-03-03 ENCOUNTER — Encounter (INDEPENDENT_AMBULATORY_CARE_PROVIDER_SITE_OTHER): Payer: No Typology Code available for payment source

## 2022-03-16 ENCOUNTER — Other Ambulatory Visit (INDEPENDENT_AMBULATORY_CARE_PROVIDER_SITE_OTHER): Payer: Self-pay | Admitting: Internal Medicine

## 2022-03-16 DIAGNOSIS — Z1231 Encounter for screening mammogram for malignant neoplasm of breast: Secondary | ICD-10-CM

## 2022-03-26 ENCOUNTER — Other Ambulatory Visit (INDEPENDENT_AMBULATORY_CARE_PROVIDER_SITE_OTHER): Payer: Self-pay | Admitting: Internal Medicine

## 2022-03-26 ENCOUNTER — Ambulatory Visit: Payer: No Typology Code available for payment source | Attending: Internal Medicine

## 2022-03-26 DIAGNOSIS — Z8249 Family history of ischemic heart disease and other diseases of the circulatory system: Secondary | ICD-10-CM

## 2022-03-26 DIAGNOSIS — Z1231 Encounter for screening mammogram for malignant neoplasm of breast: Secondary | ICD-10-CM | POA: Insufficient documentation

## 2022-03-26 DIAGNOSIS — I10 Essential (primary) hypertension: Secondary | ICD-10-CM

## 2022-03-26 DIAGNOSIS — M5136 Other intervertebral disc degeneration, lumbar region: Secondary | ICD-10-CM

## 2022-03-26 DIAGNOSIS — E039 Hypothyroidism, unspecified: Secondary | ICD-10-CM

## 2022-03-26 DIAGNOSIS — E282 Polycystic ovarian syndrome: Secondary | ICD-10-CM

## 2022-04-16 ENCOUNTER — Ambulatory Visit (INDEPENDENT_AMBULATORY_CARE_PROVIDER_SITE_OTHER): Payer: No Typology Code available for payment source | Admitting: Interventional Cardiology

## 2022-04-16 ENCOUNTER — Encounter (INDEPENDENT_AMBULATORY_CARE_PROVIDER_SITE_OTHER): Payer: Self-pay | Admitting: Internal Medicine

## 2022-04-18 ENCOUNTER — Other Ambulatory Visit (INDEPENDENT_AMBULATORY_CARE_PROVIDER_SITE_OTHER): Payer: Self-pay | Admitting: Internal Medicine

## 2022-04-18 DIAGNOSIS — E282 Polycystic ovarian syndrome: Secondary | ICD-10-CM

## 2022-04-20 NOTE — Telephone Encounter (Signed)
Rx refill(s) sent.

## 2022-06-08 ENCOUNTER — Ambulatory Visit (INDEPENDENT_AMBULATORY_CARE_PROVIDER_SITE_OTHER): Payer: No Typology Code available for payment source | Admitting: Internal Medicine

## 2022-06-08 ENCOUNTER — Encounter (INDEPENDENT_AMBULATORY_CARE_PROVIDER_SITE_OTHER): Payer: Self-pay | Admitting: Internal Medicine

## 2022-06-08 VITALS — BP 136/88 | HR 92 | Temp 98.4°F | Ht 68.0 in | Wt 141.0 lb

## 2022-06-08 DIAGNOSIS — O468X9 Other antepartum hemorrhage, unspecified trimester: Secondary | ICD-10-CM | POA: Insufficient documentation

## 2022-06-08 DIAGNOSIS — Z136 Encounter for screening for cardiovascular disorders: Secondary | ICD-10-CM

## 2022-06-08 DIAGNOSIS — E041 Nontoxic single thyroid nodule: Secondary | ICD-10-CM

## 2022-06-08 DIAGNOSIS — M5136 Other intervertebral disc degeneration, lumbar region: Secondary | ICD-10-CM

## 2022-06-08 DIAGNOSIS — Z13220 Encounter for screening for lipoid disorders: Secondary | ICD-10-CM

## 2022-06-08 DIAGNOSIS — E039 Hypothyroidism, unspecified: Secondary | ICD-10-CM

## 2022-06-08 DIAGNOSIS — I10 Essential (primary) hypertension: Secondary | ICD-10-CM

## 2022-06-08 DIAGNOSIS — R748 Abnormal levels of other serum enzymes: Secondary | ICD-10-CM | POA: Insufficient documentation

## 2022-06-08 DIAGNOSIS — Z0000 Encounter for general adult medical examination without abnormal findings: Secondary | ICD-10-CM

## 2022-06-08 DIAGNOSIS — E282 Polycystic ovarian syndrome: Secondary | ICD-10-CM

## 2022-06-08 DIAGNOSIS — O4400 Placenta previa specified as without hemorrhage, unspecified trimester: Secondary | ICD-10-CM | POA: Insufficient documentation

## 2022-06-08 LAB — COMPREHENSIVE METABOLIC PANEL
ALT: 27 U/L (ref 0–55)
AST (SGOT): 20 U/L (ref 5–41)
Albumin/Globulin Ratio: 1.3 (ref 0.9–2.2)
Albumin: 3.6 g/dL (ref 3.5–5.0)
Alkaline Phosphatase: 45 U/L (ref 37–117)
Anion Gap: 8 (ref 5.0–15.0)
BUN: 7 mg/dL (ref 7.0–21.0)
Bilirubin, Total: 0.5 mg/dL (ref 0.2–1.2)
CO2: 24 mEq/L (ref 17–29)
Calcium: 9.1 mg/dL (ref 8.5–10.5)
Chloride: 108 mEq/L (ref 99–111)
Creatinine: 0.8 mg/dL (ref 0.4–1.0)
Globulin: 2.8 g/dL (ref 2.0–3.6)
Glucose: 79 mg/dL (ref 70–100)
Potassium: 4.2 mEq/L (ref 3.5–5.3)
Protein, Total: 6.4 g/dL (ref 6.0–8.3)
Sodium: 140 mEq/L (ref 135–145)
eGFR: >60 mL/min/{1.73_m2} (ref >=60)

## 2022-06-08 LAB — LIPID PANEL
Cholesterol / HDL Ratio: 3.7 Index
Cholesterol: 159 mg/dL (ref 0–199)
HDL: 43 mg/dL (ref 40–9999)
LDL Calculated: 100 mg/dL — AB (ref 0–99)
Triglycerides: 82 mg/dL (ref 34–149)
VLDL Calculated: 16 mg/dL (ref 10–40)

## 2022-06-08 LAB — THYROID STIMULATING HORMONE (TSH) WITH REFLEX TO FREE T4: TSH, Abn Reflex to Free T4, Serum: 0.01 u[IU]/mL — ABNORMAL LOW (ref 0.35–4.94)

## 2022-06-08 LAB — CBC
Absolute NRBC: 0 10*3/uL (ref 0.00–0.00)
Hematocrit: 37.3 % (ref 34.7–43.7)
Hgb: 12.7 g/dL (ref 11.4–14.8)
MCH: 28.7 pg (ref 25.1–33.5)
MCHC: 34 g/dL (ref 31.5–35.8)
MCV: 84.2 fL (ref 78.0–96.0)
MPV: 10.9 fL (ref 8.9–12.5)
Nucleated RBC: 0 /100 WBC (ref 0.0–0.0)
Platelets: 226 10*3/uL (ref 142–346)
RBC: 4.43 10*6/uL (ref 3.90–5.10)
RDW: 12 % (ref 11–15)
WBC: 5.56 10*3/uL (ref 3.10–9.50)

## 2022-06-08 LAB — T4, FREE: T4 Free: 1.41 ng/dL (ref 0.69–1.48)

## 2022-06-08 LAB — HEMOLYSIS INDEX(SOFT): Hemolysis Index: 1 Index (ref 0–24)

## 2022-06-08 NOTE — Progress Notes (Signed)
Have you seen any specialists/other providers since your last visit with [place]?    Yes  GYN - 03/2022      The patient was informed that the following HM items are still outstanding:   Health Maintenance Due   Topic Date Due    COVID-19 Vaccine (5 - 2023-24 season) 10/10/2021

## 2022-06-08 NOTE — Progress Notes (Signed)
Subjective:      Patient ID: Peggy Kennedy is a 42 y.o. female    Chief Complaint   Patient presents with    Annual Exam        Pt presents for physical. No concerns. Was able to lose weight as desired with making diet and exercise changes. 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     The following portions of the patient's history were reviewed and updated as appropriate: current medications, allergies, past family history, past medical history, past social history, past surgical history and problem list.     Review of Systems   Constitutional:  Negative for chills, fatigue, fever and unexpected weight change.   HENT:  Negative for congestion, ear pain, hearing loss, mouth sores, rhinorrhea, sinus pressure and sore throat.    Eyes:  Negative for itching and visual disturbance.   Respiratory:  Negative for cough and shortness of breath.    Cardiovascular:  Negative for chest pain.   Gastrointestinal:  Negative for abdominal pain, blood in stool, constipation, diarrhea, nausea and vomiting.   Genitourinary:  Negative for difficulty urinating, dysuria, hematuria, menstrual problem, vaginal bleeding, vaginal discharge and vaginal pain.   Musculoskeletal:  Negative for arthralgias, joint swelling and myalgias.   Skin:  Negative for rash.   Neurological:  Negative for dizziness, weakness, light-headedness and headaches.   Hematological:  Negative for adenopathy. Does not bruise/bleed easily.   Psychiatric/Behavioral:  The patient is not nervous/anxious.    All other systems reviewed and are negative.       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    On  enoxaparin 40 mg daily  On enoxaparin 40 mg daily    Hypothyroid     Multiple benign melanocytic nevi 02/18/2016    Overweight with body mass index (BMI) of 27 to 27.9 in adult 12/03/2021    Placenta previa 06/08/2022    edge at internal -resolved at 15weeks    Subchorionic hematoma 06/08/2022    5.7x1.8x7.7cm over internal os     Past Surgical History:   Procedure Laterality Date    CESAREAN SECTION  02/2017    csection  2014     Family History   Problem Relation Age of Onset    Hypertension Mother     Hypertension Father     Cancer Father         stomach cancer    Breast cancer Paternal Aunt      Social History     Socioeconomic History    Marital status: Married   Tobacco Use    Smoking status: Never    Smokeless tobacco: Never   Vaping Use    Vaping status: Never Used   Substance and Sexual Activity    Alcohol use: Yes     Comment: occassionally    Drug use: Never    Sexual activity: Yes     Partners: Male     Birth control/protection: Pill     Comment: [name]     Social Determinants of Health  Financial Resource Strain: Low Risk  (11/02/2021)    Overall Financial Resource Strain (CARDIA)     Difficulty of Paying Living Expenses: Not very hard   Food Insecurity: No Food Insecurity (11/02/2021)    Hunger Vital Sign     Worried About Running Out of Food in the Last Year: Never true     Ran Out of Food in the Last Year: Never true   Transportation Needs: No Transportation Needs (11/02/2021)    PRAPARE - Therapist, art (Medical): No     Lack of Transportation (Non-Medical): No   Physical Activity: Insufficiently Active (06/07/2022)    Exercise Vital Sign     Days of Exercise per Week: 3 days     Minutes of Exercise per Session: 30 min   Stress: No Stress Concern Present (11/02/2021)    [employer] of Occupational Health - Occupational Stress Questionnaire     Feeling of Stress : Only a little   Social Connections: Socially Integrated (11/02/2021)    Social Connection and Isolation Panel  [NHANES]     Frequency of Communication with Friends and Family: More than three times a week     Frequency of Social Gatherings with Friends and Family: More than three times a week     Attends Religious Services: More than 4 times per year     Active Member of [employer] or Organizations: Yes     Attends Engineer, structural: More than 4 times per year     Marital Status: Married   Catering manager Violence: Not At Risk (06/07/2022)    Humiliation, Afraid, Rape, and Kick questionnaire     Fear of Current or Ex-Partner: No     Emotionally Abused: No     Physically Abused: No     Sexually Abused: No   Housing Stability: Low Risk  (11/02/2021)    Housing Stability Vital Sign     Unable to Pay for Housing in the Last Year: No     Number of Places Lived in the Last Year: 1     Unstable Housing in the Last Year: No     Current Outpatient Medications   Medication Sig Dispense Refill    levothyroxine (SYNTHROID) 137 MCG tablet Take 1 tablet (137 mcg) by mouth daily 100 tablet 1    lisinopril (ZESTRIL) 5 MG tablet Take 1 tablet (5 mg) by mouth daily 100 tablet 1    Syeda 3-0.03 MG per tablet TAKE 1 TABLET BY MOUTH EVERY DAY 84 tablet 1     No current facility-administered medications for this visit.     No Known Allergies      BP 136/88   Pulse 92   Temp 98.4 F (36.9 C)   Ht 1.727 m (5\' 8" )   Wt 64 kg (141 lb)   LMP 05/19/2022 (Exact Date)   SpO2 98%   BMI 21.44 kg/m    Objective:   Physical Exam  Vitals reviewed.   Constitutional:       Appearance: She is well-developed.   HENT:      Head: Normocephalic and atraumatic.      Right Ear: Hearing, tympanic membrane and external ear normal.      Left Ear: Hearing, tympanic membrane and external ear normal.      Nose: Nose normal.      Mouth/Throat:      Pharynx: No oropharyngeal exudate.   Eyes:      Conjunctiva/sclera: Conjunctivae  normal.      Pupils: Pupils are equal, round, and reactive to light.   Neck:      Thyroid: No thyromegaly.   Cardiovascular:      Rate  and Rhythm: Normal rate and regular rhythm.      Heart sounds: Normal heart sounds. No murmur heard.  Pulmonary:      Effort: Pulmonary effort is normal. No respiratory distress.      Breath sounds: Normal breath sounds.   Abdominal:      General: Bowel sounds are normal. There is no distension.      Palpations: Abdomen is soft. There is no hepatomegaly, splenomegaly or mass.      Tenderness: There is no abdominal tenderness. There is no guarding or rebound.   Musculoskeletal:         General: No tenderness. Normal range of motion.      Cervical back: Normal range of motion and neck supple.   Lymphadenopathy:      Cervical: No cervical adenopathy.      Upper Body:      Right upper body: No supraclavicular adenopathy.      Left upper body: No supraclavicular adenopathy.   Skin:     General: Skin is warm and dry.      Findings: No rash.   Neurological:      Mental Status: She is alert and oriented to person, place, and time.       Wt Readings from Last 3 Encounters:   06/08/22 64 kg (141 lb)   01/12/22 80.3 kg (177 lb)   12/03/21 80.6 kg (177 lb 12.8 oz)            Assessment:     1. Routine history and physical examination of adult  CBC without differential    Comprehensive metabolic panel    Lipid panel      2. Acquired hypothyroidism  TSH, Abn Reflex to Free T4, Serum      3. DDD (degenerative disc disease), lumbar        4. PCOS (polycystic ovarian syndrome)        5. Primary hypertension        6. Elevated liver enzymes        7. Thyroid nodule  [place] Head Neck Soft Tissue      8. Encounter for lipid screening for cardiovascular disease  Lipid panel            Plan:     42 y.o. female presents for physical and fasting blood work    1.Discussed the patient's BMI with her.  Body mass index is 21.44 kg/m.     2. Counseling: Counseling/Anticipatory Guidance (40-64): Nutrition, physical activity, healthy weight, injury prevention, misuse of tobacco, alcohol and drugs, sexual behavior and STDs, contraception, dental  health, mental health, immunizations, screenings.     3. HTN: improved on recheck   -continue lisinopril 5mg  daily  -continue home bp log and bring to next visit  -confirmed home bp monitor at last visit, seems to be a bit higher with home machine  -continue low salt, healthy diet and maintain healthy weight  -referred to cardio per request given fam hx-work up still pending, echo and CAC planned. Advised to schedule asap  -counseled on signs/symptoms to warrant re-evaluation     4. Hypothyroidism: last TSH high, dose increased to and feels better  -recheck TSH   -recheck thyroid US    5. PCOS: stable  -continue OCP  -  follow up with gyn as recommended     Health maintenance:  -Fasting blood work: ordered  -Immunizations: recommend COVID   -STD screen: declined   -Derm: up to date, sees Dr. Gaylyn Rong  -Pap: up to date, sees GYN  -Mammogram: UTD  -DEXA: due at 13  -Colonoscopy: due at 32  -Vision: UTD  -Dental: UTD  -Advanced directive: discuss at future visit      Return in about 1 year (around 06/08/2023) for next physical or sooner as needed.     Gust Brooms, MD

## 2022-06-12 ENCOUNTER — Ambulatory Visit: Payer: No Typology Code available for payment source | Attending: Internal Medicine

## 2022-06-12 DIAGNOSIS — E041 Nontoxic single thyroid nodule: Secondary | ICD-10-CM | POA: Insufficient documentation

## 2022-06-18 ENCOUNTER — Ambulatory Visit (INDEPENDENT_AMBULATORY_CARE_PROVIDER_SITE_OTHER): Payer: No Typology Code available for payment source

## 2022-06-18 ENCOUNTER — Encounter (INDEPENDENT_AMBULATORY_CARE_PROVIDER_SITE_OTHER): Payer: Self-pay | Admitting: Internal Medicine

## 2022-06-18 DIAGNOSIS — I10 Essential (primary) hypertension: Secondary | ICD-10-CM

## 2022-06-18 DIAGNOSIS — Z8249 Family history of ischemic heart disease and other diseases of the circulatory system: Secondary | ICD-10-CM

## 2022-06-18 DIAGNOSIS — E039 Hypothyroidism, unspecified: Secondary | ICD-10-CM

## 2022-06-18 MED ORDER — LEVOTHYROXINE SODIUM 137 MCG PO TABS
125.0000 ug | ORAL_TABLET | Freq: Every day | ORAL | 1 refills | Status: DC
Start: 2022-06-18 — End: 2022-06-24

## 2022-06-21 LAB — ECHO ADULT TTE COMPLETE
AV Area (Cont Eq VTI): 2.35
AV Area (Cont Eq VTI): 2.355
AV Mean Gradient: 3
AV Peak Velocity: 1.25
Ao Root Diameter (2D): 3.5
IVS Diastolic Thickness (2D): 0.928
LA Dimension (2D): 2.7
LA Volume Index (BP A-L): 13
LVID diastole (2D): 4.72
LVID systole (2D): 2.78
MV Area (PHT): 4.101
MV E/A: 1.2
MV E/A: 1.201
MV E/e' (Average): 6.297
Mitral Valve Findings: NORMAL
Prox Ascending Aorta Diameter: 3.8
Prox Ascending Aorta Diameter: 3.8
Pulmonary Valve Findings: NORMAL
RV Basal Diastolic Dimension: 3.18
RV Function: NORMAL
RV Systolic Pressure: 12
RV Systolic Pressure: 13.112
Site RA Size (AS): NORMAL
Site RV Size (AS): NORMAL
Summary: NORMAL
Summary: NORMAL
Summary: NORMAL
Summary: NORMAL
Summary: NORMAL
Summary: NORMAL
TAPSE: 1.84
Tricuspid Valve Findings: NORMAL

## 2022-06-23 NOTE — Progress Notes (Signed)
ECHO was normal x for mild aortic regurgitation and mild ascending aorta dilation.

## 2022-06-24 MED ORDER — LEVOTHYROXINE SODIUM 125 MCG PO TABS
125.0000 ug | ORAL_TABLET | Freq: Every day | ORAL | 1 refills | Status: DC
Start: 2022-06-24 — End: 2022-12-19

## 2022-07-02 ENCOUNTER — Telehealth (INDEPENDENT_AMBULATORY_CARE_PROVIDER_SITE_OTHER): Payer: Self-pay | Admitting: Interventional Cardiology

## 2022-07-02 NOTE — Telephone Encounter (Signed)
Representative from [employer] called to inform MD that CT Coronary Calcium Score  needs peer to peer review .She is requesting to call Insurance to set up a time  Please advise

## 2022-07-03 ENCOUNTER — Ambulatory Visit
Admission: RE | Admit: 2022-07-03 | Discharge: 2022-07-03 | Disposition: A | Discharge status: Home / Self Care | Payer: No Typology Code available for payment source | Source: Ambulatory Visit | Attending: Interventional Cardiology | Admitting: Interventional Cardiology

## 2022-07-03 DIAGNOSIS — E282 Polycystic ovarian syndrome: Secondary | ICD-10-CM | POA: Insufficient documentation

## 2022-07-03 DIAGNOSIS — Z8249 Family history of ischemic heart disease and other diseases of the circulatory system: Secondary | ICD-10-CM | POA: Insufficient documentation

## 2022-07-03 DIAGNOSIS — I10 Essential (primary) hypertension: Secondary | ICD-10-CM

## 2022-08-04 ENCOUNTER — Telehealth (INDEPENDENT_AMBULATORY_CARE_PROVIDER_SITE_OTHER): Payer: No Typology Code available for payment source | Admitting: Interventional Cardiology

## 2022-08-04 ENCOUNTER — Encounter (HOSPITAL_BASED_OUTPATIENT_CLINIC_OR_DEPARTMENT_OTHER): Payer: Self-pay | Admitting: Interventional Cardiology

## 2022-08-04 VITALS — BP 130/90 | Ht 68.0 in | Wt 141.0 lb

## 2022-08-04 DIAGNOSIS — Z136 Encounter for screening for cardiovascular disorders: Secondary | ICD-10-CM

## 2022-08-04 DIAGNOSIS — I77810 Thoracic aortic ectasia: Secondary | ICD-10-CM

## 2022-08-04 DIAGNOSIS — I10 Essential (primary) hypertension: Secondary | ICD-10-CM

## 2022-08-04 DIAGNOSIS — Z8249 Family history of ischemic heart disease and other diseases of the circulatory system: Secondary | ICD-10-CM

## 2022-08-04 DIAGNOSIS — I351 Nonrheumatic aortic (valve) insufficiency: Secondary | ICD-10-CM

## 2022-08-04 NOTE — Progress Notes (Signed)
TELEMEDICINE CONSENT:  The patient gave verbal consent to participate in TELEMEDICINE FOLLOW UP VISIT to help with mandate for social distancing and mandate to avoid non-essential routine face-to-face in-person clinic appointments.    [facility]    CC:    Chief Complaint   Patient presents with    Hypertension   Review test results.    HPI:  Peggy Kennedy is a 42 y.o. female with Hashimoto's thyroiditis, hypothyroidism, PCOS, essential HTN, family history of HTN, menstrual HA/migraine, and Lumbar DDD.      She complains of fatigue today.  She notes she is "always tired".  She also has monthly HA.  She denies shortness of breath, chest pain, chest heaviness, or chest tightness.  She does sometimes do HIIT exercises.  She did not have issues doing this but had to stop due to back pains/stiffness.    She would like to discuss her BP.  Her and PCP noted her BP higher readings in last 2 years.  In the last year she has started discussing BP medications.  Started Lisinopril 5mg  daily in October 2023.  She is tolerating well.  Diastolic number still mildly elevated.  Not able to do some types of exercises due to Lumbar DDD/herniated disc.    She has no history of TIA, CVA, amaurosis fugax.  She has no history of HTN urgency, HTN emergency, or UC/ED visits for HTN.    CAC score was 0.      Renal artery duplex is normal.      ECHO is normal.  Mild aortic regurgitation.  Mild ascending aorta dilation.  LV and RV function were normal.    She notes she had two children with help of fertility specialists.  She denies history of pre-eclampsia, eclampsia, or toxemia.    She played volleyball into her 20's and did not have an issue with exertional symptoms.    Life-long non-smoker  Fam Hx   Father - HTN at young age, CAD / CABG at 42 years, gastric cancer that took his life, CVA  Mother - HTN  Sister - HTN    Past Med Hx:   Active Ambulatory Problems     Diagnosis Date Noted    Acquired  hypothyroidism 07/03/2018    DDD (degenerative disc disease), lumbar 07/03/2018    PCOS (polycystic ovarian syndrome) 08/09/2018    Primary hypertension 12/03/2021    Family history of coronary artery disease in father 01/12/2022    Elevated liver enzymes 06/08/2022    Nonrheumatic aortic valve insufficiency 08/04/2022    Aortic ectasia, thoracic 08/04/2022     Resolved Ambulatory Problems     Diagnosis Date Noted    Overweight with body mass index (BMI) of 27 to 27.9 in adult 12/03/2021    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    Multiple benign melanocytic nevi 02/18/2016    Placenta previa 06/08/2022    Subchorionic hematoma 06/08/2022     Past Medical History:   Diagnosis Date    Back pain     Hypothyroid        Surg Hx:   Past Surgical History:   Procedure Laterality Date    CESAREAN SECTION  02/2017    csection  2014       Fam Hx:    Family History   Problem Relation Age of Onset    Hypertension Mother     Hypertension Father     Cancer Father  stomach cancer    Breast cancer Paternal Aunt        Soc Hx:    Social History     Socioeconomic History    Marital status: Married     Spouse name: None    Number of children: None    Years of education: None    Highest education level: None   Occupational History    None   Tobacco Use    Smoking status: Never     Passive exposure: Never    Smokeless tobacco: Never   Vaping Use    Vaping status: Never Used   Substance and Sexual Activity    Alcohol use: Yes     Comment: occassionally    Drug use: Never    Sexual activity: Yes     Partners: Male     Birth control/protection: Pill     Comment: [name]   Other Topics Concern    None   Social History Narrative    None     Social Determinants of Health     Financial Resource Strain: Low Risk  (08/04/2022)    Overall Financial Resource Strain (CARDIA)     Difficulty of Paying Living Expenses: Not very hard   Food Insecurity: No Food Insecurity (11/02/2021)    Hunger Vital Sign     Worried About Running Out of Food  in the Last Year: Never true     Ran Out of Food in the Last Year: Never true   Transportation Needs: No Transportation Needs (11/02/2021)    PRAPARE - Therapist, art (Medical): No     Lack of Transportation (Non-Medical): No   Physical Activity: Insufficiently Active (08/04/2022)    Exercise Vital Sign     Days of Exercise per Week: 3 days     Minutes of Exercise per Session: 30 min   Stress: No Stress Concern Present (11/02/2021)    [employer] of Occupational Health - Occupational Stress Questionnaire     Feeling of Stress : Only a little   Social Connections: Socially Integrated (11/02/2021)    Social Connection and Isolation Panel [NHANES]     Frequency of Communication with Friends and Family: More than three times a week     Frequency of Social Gatherings with Friends and Family: More than three times a week     Attends Religious Services: More than 4 times per year     Active Member of [employer] or Organizations: Yes     Attends Engineer, structural: More than 4 times per year     Marital Status: Married   Catering manager Violence: Not At Risk (06/07/2022)    Humiliation, Afraid, Rape, and Kick questionnaire     Fear of Current or Ex-Partner: No     Emotionally Abused: No     Physically Abused: No     Sexually Abused: No   Housing Stability: Low Risk  (11/02/2021)    Housing Stability Vital Sign     Unable to Pay for Housing in the Last Year: No     Number of Places Lived in the Last Year: 1     Unstable Housing in the Last Year: No       Current Meds:   Current Outpatient Medications:     levothyroxine (SYNTHROID) 125 MCG tablet, Take 1 tablet (125 mcg) by mouth daily, Disp: 90 tablet, Rfl: 1    lisinopril (ZESTRIL) 5 MG tablet, Take 1 tablet (5  mg) by mouth daily, Disp: 100 tablet, Rfl: 1    Syeda 3-0.03 MG per tablet, TAKE 1 TABLET BY MOUTH EVERY DAY, Disp: 84 tablet, Rfl: 1    Allergies:  No Known Allergies      Physical Exam:  BP 130/90   Ht 1.727 m (5\' 8" )   Wt 64 kg  (141 lb)   BMI 21.44 kg/m     Physical Exam  Vitals reviewed.   Constitutional:       General: She is not in acute distress.     Appearance: She is well-developed. She is not diaphoretic.   HENT:      Head: Normocephalic and atraumatic.      Right Ear: External ear normal.      Left Ear: External ear normal.      Nose: Nose normal. No congestion.      Mouth/Throat:      Pharynx: Oropharynx is clear.   Eyes:      General: No scleral icterus.        Right eye: No discharge.         Left eye: No discharge.      Conjunctiva/sclera: Conjunctivae normal.   Neck:      Thyroid: No thyromegaly.      Vascular: No JVD.      Trachea: No tracheal deviation.   Pulmonary:      Effort: Pulmonary effort is normal. No respiratory distress.      Breath sounds: No rales.   Chest:      Chest wall: No tenderness.   Abdominal:      General: Bowel sounds are normal. There is no distension.      Palpations: Abdomen is soft.      Tenderness: There is no abdominal tenderness.   Genitourinary:     Comments: deferred  Musculoskeletal:         General: No tenderness or deformity. Normal range of motion.      Cervical back: Normal range of motion and neck supple.      Right lower leg: No edema.      Left lower leg: No edema.   Skin:     General: Skin is warm.      Coloration: Skin is not jaundiced or pale.      Findings: No erythema or rash.   Neurological:      General: No focal deficit present.      Mental Status: She is alert and oriented to person, place, and time. Mental status is at baseline.      Cranial Nerves: No cranial nerve deficit.      Motor: No weakness.      Coordination: Coordination normal.      Gait: Gait normal.   Psychiatric:         Behavior: Behavior normal.         Thought Content: Thought content normal.         Judgment: Judgment normal.       LABS:  Lab Results   Component Value Date    WBC 5.56 06/08/2022    HGB 12.7 06/08/2022    HCT 37.3 06/08/2022    MCV 84.2 06/08/2022    PLT 226 06/08/2022       No results found  for: "BNP"    '  Chemistry        Component Value Date/Time    NA 140 06/08/2022 1124    K 4.2 06/08/2022 1124    CL  108 06/08/2022 1124    CO2 24 06/08/2022 1124    BUN 7.0 06/08/2022 1124    CREAT 0.8 06/08/2022 1124    GLU 79 06/08/2022 1124        Component Value Date/Time    CA 9.1 06/08/2022 1124    ALKPHOS 45 06/08/2022 1124    AST 20 06/08/2022 1124    ALT 27 06/08/2022 1124    BILITOTAL 0.5 06/08/2022 1124            Lab Results   Component Value Date    CHOL 159 06/08/2022    CHOL 172 12/03/2021    CHOL 188 05/06/2021     Lab Results   Component Value Date    HDL 43 06/08/2022    HDL 63 12/03/2021    HDL 53 05/06/2021     Lab Results   Component Value Date    LDL 100 (A) 06/08/2022    LDL 97 12/03/2021    LDL 118 (H) 05/06/2021     Lab Results   Component Value Date    TRIG 82 06/08/2022    TRIG 59 12/03/2021    TRIG 83 05/06/2021       Lab Results   Component Value Date    TSH 0.01 (L) 06/08/2022    TSH 7.31 (H) 12/03/2021       No results found for: "CRP"    DIAGNOSTIC STUDIES:  ECHO (06/18/2022):  * Left ventricular systolic function is normal with an EF by Biplane Method of 59%.  LV EF appears visually 60-65%.  * Left ventricular segmental wall motion is normal.  * The left ventricle is normal in size.  * There is normal left ventricular diastolic function.  * The right ventricular cavity size is normal in size.  * Normal right ventricular systolic function.  TAPSE 18 mm.  RV s' annular velocity 11 cm/sec.  * The left atrium is small in size.  * All cardiac valves appear structurally normal.  * The aortic valve is tricuspid.  * There is no aortic stenosis.  * There is mild aortic regurgitation.  * No pulmonary hypertension with estimated right ventricular systolic pressure 15-20 mm Hg.  * The IVC is normal in size with > 50% respiratory variance consistent with normal RA pressure 3 mm Hg.  * No pericardial effusion visualized.  * The aortic root is normal in size at 3.5 cm in diameter.  * The ascending  aorta is mildly dilated at 3.8 cm in diameter (2.2 cm/m squared).  * No prior study is available for comparison.    ECG (01/12/2022):  SR, rate 86 bpm, horizontal axis, PRWP, no ST-T abnormalities.    IMAGING:  CAC SCAN (07/03/2022):  FINDINGS:   CALCIUM SCORING:     Left main:                           0  Left anterior descending:   0  Left circumflex:                  0  Right Coronary:                 0  Posterior Descending:       0     TOTAL CAC SCORE:        0     AGE/SEX MATCHED SCORE PERCENTILE: 91% of patients in this age group also score 0.  OTHER FINDINGS: None    IMPRESSION:    Calcium score is 0.     RENAL ARTERIAL DUPLEX (07/03/2022):  FINDINGS: The highest detectable renal artery velocities translate into renal-aortic velocity ratios (RARs) of 1.2 on the right, and 1.2 on the left. These are less than the 3.5 ratio used to predict the presence of pressure significant renal artery occlusive disease of 60% or more.  No high velocity flow jets or post-stenotic turbulence were identified.     Parenchymal resistive indices are normal at 0.57-0.65 right, and 0.58-0.60 left, thus there is no evidence of increased renovascular resistance.   Both renal veins are patent.   The kidney sizes are within a normal range at 10.4 cm on the right, and 10.7 cm on the left.      The aorta is nondilated and demonstrates no significant plaque formation.     IMPRESSION:    No evidence of significant renal artery stenosis.    Transvaginal Duplex (12/15/2019):  FINDINGS:  Measurements:  Uterus: 7.4 x 3.2 x 4.2 cm  Endometrium (bilayer thickness): 0.3 cm  Right ovary: 2.0 x 1.5 x 1.2 cm  Left ovary: 1.7 x 1.3 x 1.5 cm     No fibroids or other myometrial abnormalities are visualized.      The double layer endometrial thickness is normal. No focal endometrial abnormality is seen.      Ovarian texture is normal. No suspicious ovarian or adnexal masses are identified. No abnormal free fluid is seen.     IMPRESSION:   Normal  examination.    Abdominal Duplex (12/15/2019):  FINDINGS:.  Directed ultrasound demonstrates segmental portions of a normal-appearing appendix which is compressible. Transverse dimension is 3 mm. There is no free fluid in the right lower quadrant. No right hydronephrosis     IMPRESSION:   Negative ultrasound for acute appendicitis    ASSESSMENT/PLAN:  Peggy Kennedy is a 42 y.o. female with     Tresha was seen today for hypertension.    Diagnoses and all orders for this visit:    Primary hypertension    Family history of coronary artery disease in father    Family history of heart disease    Aortic ectasia, thoracic  Comments:  3.8 cm in 2024    Nonrheumatic aortic valve insufficiency    Screening for ischemic heart disease      1) Primary HTN  The patient is somewhat young for HTN.  She can add in regular exercise which may help significantly with the amount of medication needed to control HTN and possibly can get off medication with enough cardio-focused exercise.  ECHO shows normal LV and RV function and no LVH.  We also reviewed healthy diet plan which she is already working on.  Questionably related to recent titration / changes of Synthroid (Last TSH 0.01).  Suspect more familial.  Continue Lisinopril 5mg  daily.  Bring BP cuff into Clinic July 8 AM for BP check and possible calibration if her device allows - possibly needs to increase Lisinopril from 5mg  to 10mg  daily.    2) Family history of CAD in father  Pt is asymptomatic.  CAC Scan - 0 (2024).  Encouraged regular exercise.  ECHO normal x for mild aortic dilation and mild aortic regurgitation.    3) Ascending aortic ectasia  2024 ECHO showed Aortic root upper normal in size at 3.5 cm and the ascending aorta is mildly dilated at 3.8 cm in diameter (2.2 cm/m squared).  Continue Lisinopril 5mg  daily.  Repeat assessment in 3-4 years.    4) Aortic regurgitation, mild  Repeat ECHO in 3-4 years.    5) Screening for Ischemic heart disease  Pt  concerned about her early onset HTN and her family history.  CAC Scan 0 (2024).  Non-smoker.  Exercise encouraged.  Controlling BP with single medx.    RTC 3 years in person for MD follow up.  Plans to come into office for BP check July 8 AM.  I discussed with the patient the plan of care including risks and benefits as well as alternatives to this plan of care.    10 mins spent reviewing recent Cardiology Office Visit note, PCP office visit notes, labs, Renal artery duplex results, ECHO results, CAC scan results, and prior ECG.  15 mins spent with patient directly.  15 mins spent charting and ordering appropriate follow up and arranging MA/RN BP check visit.  Total time spent on visit today 40 mins.    Mal Amabile, MD, [facility]  Interventional Cardiology  [facility]  [address], [address]  [place], [place]  [address]  Fax [phone]  Phone [phone]

## 2022-08-04 NOTE — Patient Instructions (Signed)
Feel free to come to our clinic on morning of July 8 for a Blood pressure check.  I would bring your home blood pressure monitor with you to check accuracy.    No medication changes recommended today.    Return to clinic 3 - 4 years to see MD.

## 2022-08-06 ENCOUNTER — Other Ambulatory Visit (INDEPENDENT_AMBULATORY_CARE_PROVIDER_SITE_OTHER): Payer: Self-pay | Admitting: Internal Medicine

## 2022-08-06 DIAGNOSIS — B001 Herpesviral vesicular dermatitis: Secondary | ICD-10-CM

## 2022-08-07 NOTE — Telephone Encounter (Signed)
Rx refill(s) sent.

## 2022-08-09 ENCOUNTER — Encounter (INDEPENDENT_AMBULATORY_CARE_PROVIDER_SITE_OTHER): Payer: Self-pay | Admitting: Internal Medicine

## 2022-08-10 ENCOUNTER — Encounter (INDEPENDENT_AMBULATORY_CARE_PROVIDER_SITE_OTHER): Payer: Self-pay

## 2022-08-10 ENCOUNTER — Ambulatory Visit (INDEPENDENT_AMBULATORY_CARE_PROVIDER_SITE_OTHER): Payer: No Typology Code available for payment source

## 2022-08-10 VITALS — BP 152/97 | HR 85 | Temp 98.8°F | Wt 145.0 lb

## 2022-08-10 DIAGNOSIS — D649 Anemia, unspecified: Secondary | ICD-10-CM

## 2022-08-10 DIAGNOSIS — K8020 Calculus of gallbladder without cholecystitis without obstruction: Secondary | ICD-10-CM

## 2022-08-10 NOTE — Progress Notes (Signed)
Have you seen any specialists/other providers since your last visit with [place]?    Yes  [facility]      The patient was informed that the following HM items are still outstanding:   Health Maintenance Due   Topic Date Due    COVID-19 Vaccine (5 - 2023-24 season) 10/10/2021

## 2022-08-10 NOTE — Progress Notes (Signed)
Subjective:      Date: 08/10/2022 4:46 PM   Patient ID: Peggy Kennedy is a 42 y.o. female.    Chief Complaint:  Chief Complaint   Patient presents with    ER Follow-up     Seen in ER Saturday for right side abdominal pain. Pain had started Friday after she ate, worsened on Saturday after eating dinner. Did have some diarrhea and n/v. Ultrasound in ER showed Cholelithiasis.       HPI:  Pt presents today for ER follow up from 6/29 ([facility]), for severe RUQ pain.  US showed cholelithiasis (with multiple stones).    Pt reports pain has improved, had some diarrhea on Saturday, no BM since.  Nausea/vomiting on Saturday, but now improved         Problem List:  Patient Active Problem List   Diagnosis    Acquired hypothyroidism    DDD (degenerative disc disease), lumbar    PCOS (polycystic ovarian syndrome)    Primary hypertension    Family history of coronary artery disease in father    Elevated liver enzymes    Nonrheumatic aortic valve insufficiency    Aortic ectasia, thoracic       Current Medications:  Outpatient Medications Marked as Taking for the 08/10/22 encounter (Office Visit) with Neomia Dear, FNP   Medication Sig Dispense Refill    levothyroxine (SYNTHROID) 125 MCG tablet Take 1 tablet (125 mcg) by mouth daily 90 tablet 1    lisinopril (ZESTRIL) 5 MG tablet Take 1 tablet (5 mg) by mouth daily 100 tablet 1    Syeda 3-0.03 MG per tablet TAKE 1 TABLET BY MOUTH EVERY DAY 84 tablet 1       Allergies:  No Known Allergies    Past Medical History:  Past Medical History:   Diagnosis Date    Back pain     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    On enoxaparin 40 mg daily  On enoxaparin 40 mg daily    Hypothyroid     Multiple benign melanocytic nevi 02/18/2016    Overweight with body mass index (BMI) of 27 to 27.9 in adult 12/03/2021    Placenta previa 06/08/2022    edge at internal -resolved at 15weeks    Subchorionic hematoma 06/08/2022    5.7x1.8x7.7cm over internal os       Past  Surgical History:  Past Surgical History:   Procedure Laterality Date    CESAREAN SECTION  02/2017    csection  2014       Family History:  Family History   Problem Relation Age of Onset    Hypertension Mother     Cholelithiasis Mother     Hypertension Father     Cancer Father         stomach cancer    Cholelithiasis Father     Breast cancer Paternal Aunt        Social History:  Social History     Tobacco Use    Smoking status: Never     Passive exposure: Never    Smokeless tobacco: Never   Vaping Use    Vaping status: Never Used   Substance Use Topics    Alcohol use: Yes     Comment: occassionally    Drug use: Never         The following portions of the patient's history were reviewed and updated as appropriate: allergies, current medications, past family history, past medical history,  past social history, past surgical history and problem list.      ROS:  Review of Systems   Constitutional:  Negative for chills and fever.   Gastrointestinal:  Positive for abdominal pain (improving). Negative for nausea and vomiting.   Musculoskeletal:  Negative for arthralgias (right shoulder pain - now resolved.).        Objective:   Vitals:  BP (!) 152/97 (BP Site: Right arm, Patient Position: Sitting)   Pulse 85   Temp 98.8 F (37.1 C)   Wt 65.8 kg (145 lb)   LMP 07/20/2022 (Approximate)   SpO2 98%   BMI 22.05 kg/m       Physical Exam:  General Examination:   Physical Exam  Vitals and nursing note reviewed.   Constitutional:       General: She is not in acute distress.     Appearance: Normal appearance. She is not ill-appearing.   HENT:      Head: Normocephalic.   Pulmonary:      Effort: Pulmonary effort is normal. No respiratory distress.   Abdominal:      General: Bowel sounds are decreased.      Tenderness: There is abdominal tenderness in the right upper quadrant.   Neurological:      General: No focal deficit present.      Mental Status: She is alert and oriented to person, place, and time.   Psychiatric:          Mood and Affect: Mood normal.         Behavior: Behavior normal.          Assessment:       1. Calculus of gallbladder without cholecystitis without obstruction  - Referral to Gastroenterology (EXTERNAL); Future    2. Anemia, unspecified type  Mild anemia noted on lab work.        Plan:       Orders Placed This Encounter    Referral to Gastroenterology (EXTERNAL)        Continue all current Rx medications the same  Follow up with all specialists as recommended/scheduled  Reviewed Labs, Imaging (CT/Xray/MRI), and provider documentation from recent ER Visit.  Discussed with patient.     Follow-up:   Return if symptoms worsen or fail to improve.       Neomia Dear, FNP

## 2022-08-11 ENCOUNTER — Encounter (HOSPITAL_BASED_OUTPATIENT_CLINIC_OR_DEPARTMENT_OTHER): Payer: Self-pay | Admitting: Interventional Cardiology

## 2022-08-12 ENCOUNTER — Ambulatory Visit (INDEPENDENT_AMBULATORY_CARE_PROVIDER_SITE_OTHER): Payer: No Typology Code available for payment source | Admitting: Family Nurse Practitioner

## 2022-08-12 ENCOUNTER — Encounter (INDEPENDENT_AMBULATORY_CARE_PROVIDER_SITE_OTHER): Payer: Self-pay | Admitting: Internal Medicine

## 2022-08-17 ENCOUNTER — Encounter: Payer: Self-pay | Admitting: Surgery

## 2022-08-17 ENCOUNTER — Ambulatory Visit (INDEPENDENT_AMBULATORY_CARE_PROVIDER_SITE_OTHER): Payer: No Typology Code available for payment source

## 2022-08-17 ENCOUNTER — Ambulatory Visit (INDEPENDENT_AMBULATORY_CARE_PROVIDER_SITE_OTHER): Payer: No Typology Code available for payment source | Admitting: Surgery

## 2022-08-17 VITALS — BP 152/111 | HR 87 | Temp 98.4°F | Ht 68.0 in | Wt 136.0 lb

## 2022-08-17 VITALS — BP 132/100

## 2022-08-17 DIAGNOSIS — K8020 Calculus of gallbladder without cholecystitis without obstruction: Secondary | ICD-10-CM

## 2022-08-17 DIAGNOSIS — I10 Essential (primary) hypertension: Secondary | ICD-10-CM

## 2022-08-17 DIAGNOSIS — Z8249 Family history of ischemic heart disease and other diseases of the circulatory system: Secondary | ICD-10-CM

## 2022-08-17 DIAGNOSIS — R748 Abnormal levels of other serum enzymes: Secondary | ICD-10-CM

## 2022-08-17 MED ORDER — LISINOPRIL 5 MG PO TABS
10.0000 mg | ORAL_TABLET | Freq: Every day | ORAL | Status: DC
Start: 2022-08-17 — End: 2022-08-21

## 2022-08-17 NOTE — Progress Notes (Signed)
BP remains elevated at home.  BP today in office electric cuff 152/97 mm Hg.    Pt cuff got 146/112 mm Hg  Manually and got 132/100 mm Hg both Left and Right arm.     She notes her father developed HTN in his early 66's.    She remains on Lisinopril 5 mg daily.  Will increase to 10mg  daily.    She also notes she has gallstones and will meet with General Surgeon today to decide on next steps.

## 2022-08-17 NOTE — Progress Notes (Signed)
Laparoscopic Cholecystectomy    ICD 10 K81.1  CPT code 16109    Surgery Scheduling posting sent to [facility]  [facility] request sent t    NPR per [facility] online  Decision ID #: [id number]    Surgery scheduling details discussed with patient    MyChart confirmation sent to patient

## 2022-08-17 NOTE — Patient Instructions (Signed)
Increase Lisinopril from 5mg  to 10mg  (two tabs daily).      Return to clinic in 4 months with Marva Panda, NP

## 2022-08-17 NOTE — Progress Notes (Unsigned)
Visit Date Time: 08/17/2022  5:37 PM     Referring Provider:   Mariann Laster, MD  [facility] Provider: Talbert Nan, MD    Service: General Surgery    Reason For Visit:  Gallbladder Problem        History of Present Illness:   Peggy Kennedy is a 42 y.o. 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  She  presents with symptoms felt to be related to the gallbladder.  The patient reports pain.  The pain is described as intermittent, sharp, RUQ, without radiation, nausea, and vomiting. Onset was 5 months ago. Symptoms have been waxing and waning since. Aggravating factors: diet.  Alleviating factors: time. Associated symptoms: diarrhea, nausea, and vomiting. The patient denies none.  Workup has included: [place] with findings of gallstones.  There has been no additional workup.    The following data was personally reviewed during the evaluation of this problem:  previous medical records/operative notes and imaging reports  Past Medical History:     Past Medical History:   Diagnosis Date    Back pain     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    On enoxaparin 40 mg daily  On enoxaparin 40 mg daily    Hypertension 2023    Hypothyroid     Multiple benign melanocytic nevi 02/18/2016    Overweight with body mass index (BMI) of 27 to 27.9 in adult 12/03/2021    Placenta previa 06/08/2022    edge at internal -resolved at 15weeks    Subchorionic hematoma 06/08/2022    5.7x1.8x7.7cm over internal os       Past Surgical History:     Past Surgical History:   Procedure Laterality Date    CESAREAN SECTION  02/2017    csection  2014       Family History:     Family History   Problem Relation Age of Onset    Hypertension  Mother     Cholelithiasis Mother     Hypertension Father     Cancer Father         stomach cancer    Cholelithiasis Father     Stroke Father     Breast cancer Paternal Aunt        Social History:     Social History     Socioeconomic History    Marital status: Married   Tobacco Use    Smoking status: Never     Passive exposure: Never    Smokeless tobacco: Never   Vaping Use    Vaping status: Never Used   Substance and Sexual Activity    Alcohol use: Not Currently     Comment: occassionally    Drug use: Never    Sexual activity: Yes     Partners: Male     Birth control/protection: Pill     Comment: Yas     Social Determinants of Health     Financial Resource Strain: Low Risk  (08/04/2022)    Overall Financial Resource Strain (CARDIA)     Difficulty of Paying Living Expenses: Not very hard   Food Insecurity: No Food Insecurity (11/02/2021)    Hunger Vital Sign     Worried About Running Out of Food in the Last Year:  Never true     Ran Out of Food in the Last Year: Never true   Transportation Needs: No Transportation Needs (11/02/2021)    PRAPARE - Therapist, art (Medical): No     Lack of Transportation (Non-Medical): No   Physical Activity: Insufficiently Active (08/04/2022)    Exercise Vital Sign     Days of Exercise per Week: 3 days     Minutes of Exercise per Session: 30 min   Stress: No Stress Concern Present (11/02/2021)    [employer] of Occupational Health - Occupational Stress Questionnaire     Feeling of Stress : Only a little   Social Connections: Socially Integrated (11/02/2021)    Social Connection and Isolation Panel [NHANES]     Frequency of Communication with Friends and Family: More than three times a week     Frequency of Social Gatherings with Friends and Family: More than three times a week     Attends Religious Services: More than 4 times per year     Active Member of [employer] or Organizations: Yes     Attends Engineer, structural: More than 4 times per year      Marital Status: Married   Catering manager Violence: Not At Risk (06/07/2022)    Humiliation, Afraid, Rape, and Kick questionnaire     Fear of Current or Ex-Partner: No     Emotionally Abused: No     Physically Abused: No     Sexually Abused: No   Housing Stability: Low Risk  (11/02/2021)    Housing Stability Vital Sign     Unable to Pay for Housing in the Last Year: No     Number of Places Lived in the Last Year: 1     Unstable Housing in the Last Year: No       Allergies:   Patient has no known allergies.    Medications:     Current Outpatient Medications   Medication Sig Dispense Refill    levothyroxine (SYNTHROID) 125 MCG tablet Take 1 tablet (125 mcg) by mouth daily 90 tablet 1    lisinopril (ZESTRIL) 5 MG tablet Take 2 tablets (10 mg) by mouth daily      Syeda 3-0.03 MG per tablet TAKE 1 TABLET BY MOUTH EVERY DAY 84 tablet 1     No current facility-administered medications for this visit.       Review of Systems:     Review of Systems   Constitutional:  Negative for chills, diaphoresis, fever, malaise/fatigue and weight loss.   HENT:  Negative for congestion, ear discharge, ear pain, hearing loss, nosebleeds, sinus pain, sore throat and tinnitus.    Eyes:  Negative for blurred vision, double vision, photophobia, pain, discharge and redness.   Respiratory:  Negative for cough, hemoptysis, sputum production, shortness of breath, wheezing and stridor.    Cardiovascular:  Negative for chest pain, palpitations, orthopnea, claudication, leg swelling and PND.   Gastrointestinal:  Positive for abdominal pain, constipation, diarrhea, heartburn, nausea and vomiting. Negative for blood in stool and melena.   Genitourinary:  Negative for dysuria, flank pain, frequency, hematuria and urgency.   Musculoskeletal:  Negative for back pain, falls, joint pain, myalgias and neck pain.   Skin:  Negative for itching and rash.   Neurological:  Positive for headaches. Negative for dizziness, tingling, tremors, sensory change, speech  change, focal weakness, seizures, loss of consciousness and weakness.   Endo/Heme/Allergies:  Negative for environmental allergies and  polydipsia. Does not bruise/bleed easily.   Psychiatric/Behavioral:  Negative for depression, hallucinations, memory loss, substance abuse and suicidal ideas. The patient is not nervous/anxious and does not have insomnia.       As per the HPI and above. The patient otherwise denies any additional changes to their otic, opthalmologic, dermatologic, pulmonary, cardiac, gastrointestinal, genitourinary, musculoskeletal, hematologic, constitutional, or psychiatric systems.    Physical Exam:     Vitals:    08/17/22 1059   BP: (!) 152/111   Pulse: 87   Temp: 98.4 F (36.9 C)   SpO2: 98%       Constitutional: Appears well, stated age.  Well nourished and well developed.  Eyes:  Conjunctiva and lids normal with no jaundice, PERRL.  Vision grossly intact.  Ear, Nose, Mouth and Throat:  Normal appearing external ears, nose and mouth.  Hearing grossly normal bilaterally.  Normal lips, teeth and gums.  Neck: Symmetric with no masses, thyromegaly, JVD, adenopathy, bruit, tenderness.  Good range of motion.  Midline trachea.  Respiratory:  Normal respiration with no effort.  No audible wheezing.  Normal and symmetric breath sounds.  No abnormalities to tenderness or percussion.  CV: Normal heart sounds with no murmur.  Normal carotid and pedal pulses.  No peripheral edema.  Abdomen: nonobese abdomen, nondistended, with no masses, organomegaly, or hernias.  Gallbladder is nonpalpable.  There is mild RUQ tenderness without Murphy's sign.  Lymphatic: No adenopathy of neck, axillae or groins.  Skin: Normal color and turgor with no rash, lesions or ulceration.  No induration or nodules on palpation.  Neuro: No gross deficits in cranial nerve function, sensation or motor function.  Psych: Normal judgement, insight, memory, mood, affect.  Oriented X 3.    Labs:    No results found for: "CBC", "BMP"   Lab  Results   Component Value Date    ALT 27 06/08/2022    AST 20 06/08/2022    ALKPHOS 45 06/08/2022    BILITOTAL 0.5 06/08/2022      Rads:     Radiology Results (24 Hour)       ** No results found for the last 24 hours. **          US Abdomen Limited Ruq    Result Date: 08/09/2022  EXAM:  Gallbladder Ultrasound. CLINICAL DATA:  Evaluate for cholecystitis COMPARISON:  None FINDINGS:   Bile Ducts: The common bile duct measures 0.3 cm.  There is no intrahepatic biliary dilatation.   Gallbladder: The gallbladder is distended.  There does be multiple small stones.  There is no pericholecystic fluid or wall thickening.    IMPRESSION: Cholelithiasis.  Distended gallbladder.  No gallbladder wall thickening or biliary dilatation is appreciated. Thank you for this referral. For further consultation, please call [phone] or [phone] (phones answered 24/7/365).  Electronically Signed by Valeda Malm, DO 08/09/2022 5:17 AM [facility]     Impression:     Patient Active Problem List   Diagnosis    Acquired hypothyroidism    DDD (degenerative disc disease), lumbar    PCOS (polycystic ovarian syndrome)    Primary hypertension    Family history of coronary artery disease in father    Elevated liver enzymes    Nonrheumatic aortic valve insufficiency    Aortic ectasia, thoracic     There are no diagnoses linked to this encounter.   Plan:   Patient with multiple (3+) occurences of RUQ abdominal pain with nausea and emesis. In march  patient with elevated LFTs, recently LFTs appropriate. Plan for laparoscopic cholecystectomy.     PLAN: CHOLE.  Given the patient's current signs and symptoms, as well as radiological evidence, I recommend proceeding with a laparoscopic cholecystectomy. The risk and benefits of the procedure were outlined with the patient to include bleeding, infection, conversion to an open procedure, bile leak, retained stone(s) and injury to the common bile duct among other unforeseen events.  The alternatives to surgery were also discussed. The patient understood and agreed to proceed. All questions have been answered at this time.        Hypertension may increase the patient's overall peri-operative risk. We will defer to the anesthesia department to assess this risk and order the appropriate pre-operative labs.    Risk/benefit analysis commensurate with the ACS Risk Calculator was used to counsel the patient regarding surgery.  Thank you for sending this patient to [facility] for evaluation.  We appreciate the opportunity to participate in their care.  We will address the issue for which they were sent, and afterward return the patient to your care and/or their PCP for any ongoing issues.  If you have any questions, do not hesitate to contact us at [phone].  Adele Dan, MD  General Surgery, PGY5  August 17, 2022      Signed by: Talbert Nan, MD

## 2022-08-21 ENCOUNTER — Telehealth (INDEPENDENT_AMBULATORY_CARE_PROVIDER_SITE_OTHER): Payer: No Typology Code available for payment source | Admitting: Internal Medicine

## 2022-08-21 ENCOUNTER — Telehealth (INDEPENDENT_AMBULATORY_CARE_PROVIDER_SITE_OTHER): Payer: Self-pay | Admitting: Internal Medicine

## 2022-08-21 DIAGNOSIS — Z8249 Family history of ischemic heart disease and other diseases of the circulatory system: Secondary | ICD-10-CM

## 2022-08-21 DIAGNOSIS — I10 Essential (primary) hypertension: Secondary | ICD-10-CM

## 2022-08-21 MED ORDER — LISINOPRIL 10 MG PO TABS
10.0000 mg | ORAL_TABLET | Freq: Every day | ORAL | 1 refills | Status: DC
Start: 2022-08-21 — End: 2023-02-12

## 2022-08-21 NOTE — Telephone Encounter (Signed)
Message from cardiologist, Dr. Malen Gauze:   Patient came for BP check Monday and BP still not at goal. I asked her to double Lisinopril from 5mg  to 10mg  daily. She would prefer that you continue to give her Lisinopril so in next few days can you send her 10mg  tabs to local pharmacy. Thanks.     Rx sent for lisinopril 10mg  as requested.     Recommend follow up in 1 month after medication change. Mychart message sent.     Gust Brooms, MD

## 2022-08-24 ENCOUNTER — Ambulatory Visit: Payer: No Typology Code available for payment source | Attending: Surgery | Admitting: Surgery

## 2022-08-24 ENCOUNTER — Other Ambulatory Visit: Payer: No Typology Code available for payment source | Admitting: Surgery

## 2022-08-24 ENCOUNTER — Ambulatory Visit (AMBULATORY_SURGERY_CENTER): Payer: No Typology Code available for payment source | Admitting: Surgery

## 2022-08-24 DIAGNOSIS — R748 Abnormal levels of other serum enzymes: Secondary | ICD-10-CM

## 2022-08-24 DIAGNOSIS — K811 Chronic cholecystitis: Secondary | ICD-10-CM

## 2022-08-24 DIAGNOSIS — K8020 Calculus of gallbladder without cholecystitis without obstruction: Secondary | ICD-10-CM

## 2022-08-24 DIAGNOSIS — I10 Essential (primary) hypertension: Secondary | ICD-10-CM

## 2022-08-24 HISTORY — PX: LAPAROSCOPIC, CHOLECYSTECTOMY: SHX4474

## 2022-08-24 MED ORDER — TRAMADOL HCL 50 MG PO TABS
50.0000 mg | ORAL_TABLET | Freq: Four times a day (QID) | ORAL | 0 refills | Status: AC | PRN
Start: 2022-08-24 — End: 2022-09-05

## 2022-08-24 NOTE — Progress Notes (Signed)
See report

## 2022-08-25 ENCOUNTER — Telehealth: Payer: Self-pay | Admitting: Internal Medicine

## 2022-08-25 ENCOUNTER — Encounter: Payer: Self-pay | Admitting: Surgery

## 2022-08-25 ENCOUNTER — Other Ambulatory Visit: Payer: Self-pay | Admitting: Surgery

## 2022-08-25 ENCOUNTER — Telehealth: Payer: Self-pay

## 2022-08-25 MED ORDER — NITROFURANTOIN MONOHYD MACRO 100 MG PO CAPS
100.0000 mg | ORAL_CAPSULE | Freq: Two times a day (BID) | ORAL | 0 refills | Status: AC
Start: 2022-08-25 — End: 2022-08-28

## 2022-08-25 NOTE — Progress Notes (Signed)
 Possible UTI

## 2022-08-25 NOTE — Telephone Encounter (Signed)
Dr. Eustace Quail ordered ABX for UTI, pt aware

## 2022-08-25 NOTE — Telephone Encounter (Signed)
Pt called in,     She had surgery done yesterday to remove her gallbladder and is experiencing UTI Symptoms. She's wondering if she can have antibiotics sent over to her pharmacy. Pt would like a cb.    Pls advise

## 2022-08-25 NOTE — Telephone Encounter (Signed)
S/p lap chole on 08/24/2022 with Dr. Eustace Quail.    Pt's husband called to leave voicemail stating pt having trouble urinating, she has no pain or any other symptoms. Just takes a while for urine to actually come out.     Calling back: pt now with urinary frequency, unsuccessful emptying of bladder, burning with urination. Informed pt of possible UTI and suggested she go to PCP or urgent care, pt requested Dr. Eustace Quail to send antibiotics,     Message sent to Dr. Eustace Quail.

## 2022-08-26 ENCOUNTER — Encounter: Payer: Self-pay | Admitting: Surgery

## 2022-08-26 ENCOUNTER — Other Ambulatory Visit: Payer: Self-pay | Admitting: Surgery

## 2022-08-26 ENCOUNTER — Telehealth: Payer: Self-pay

## 2022-08-26 MED ORDER — ONDANSETRON 4 MG PO TBDP
4.0000 mg | ORAL_TABLET | Freq: Three times a day (TID) | ORAL | 0 refills | Status: DC | PRN
Start: 2022-08-26 — End: 2022-09-09

## 2022-08-26 NOTE — Telephone Encounter (Signed)
Husband called to state that pt now with nausea and vomiting.    No yellowing of sclera or palms, no pale colored bowel movement, no continuous or increased pain, no s/s of infections.     Pt unable to keep some food down. Pt unable to keep medications down after taking them due to n/v.     Pt able to to drink and has been staying hydrated.     Message sent to Dr. Eustace Quail.

## 2022-08-26 NOTE — Telephone Encounter (Addendum)
Medication ordered, pt aware.    Informed pt to continue to monitor symptoms and update this office if worsening of symptoms.     Pt verbalized understanding.

## 2022-08-26 NOTE — Progress Notes (Signed)
ponv

## 2022-08-27 LAB — SURGICAL PATHOLOGY

## 2022-08-27 NOTE — Progress Notes (Signed)
 See 7/12 PCP telephone encounter

## 2022-08-28 ENCOUNTER — Other Ambulatory Visit (INDEPENDENT_AMBULATORY_CARE_PROVIDER_SITE_OTHER): Payer: Self-pay | Admitting: Internal Medicine

## 2022-08-28 DIAGNOSIS — I10 Essential (primary) hypertension: Secondary | ICD-10-CM

## 2022-08-28 DIAGNOSIS — Z8249 Family history of ischemic heart disease and other diseases of the circulatory system: Secondary | ICD-10-CM

## 2022-09-09 ENCOUNTER — Ambulatory Visit (INDEPENDENT_AMBULATORY_CARE_PROVIDER_SITE_OTHER): Payer: No Typology Code available for payment source | Admitting: Physician Assistant

## 2022-09-09 ENCOUNTER — Encounter: Payer: Self-pay | Admitting: Physician Assistant

## 2022-09-09 VITALS — BP 129/88 | HR 83 | Temp 97.6°F | Ht 68.0 in | Wt 131.1 lb

## 2022-09-09 DIAGNOSIS — Z9889 Other specified postprocedural states: Secondary | ICD-10-CM

## 2022-09-09 NOTE — Progress Notes (Signed)
Visit Date Time: 09/09/2022  9:40 AM     Referring Provider:    [facility] Provider: Ruel Favors, PA    POSTOP NOTE:    Reason for visit:  Here for a postop visit    Peggy Kennedy is a 42 y.o. 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  She  presents after recent surgery.  She had  laparoscopic cholecystectomy  by Dr. Eustace Quail on 08/24/2022 at  [facility] .     Pathology: In chart    She reports uneventful postop recovery and denies any other issues.    The patient has not been to the ED since surgery/last visit.  The patient did not require readmission since surgery/last visit.    Additional issues: None.    Past Medical History:     Past Medical History:   Diagnosis Date    Back pain     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    On enoxaparin 40 mg daily  On enoxaparin 40 mg daily    Hypertension 2023    Hypothyroid     Multiple benign melanocytic nevi 02/18/2016    Overweight with body mass index (BMI) of 27 to 27.9 in adult 12/03/2021    Placenta previa 06/08/2022    edge at internal -resolved at 15weeks    Subchorionic hematoma 06/08/2022    5.7x1.8x7.7cm over internal os       Past Surgical History:     Past Surgical History:   Procedure Laterality Date    CESAREAN SECTION  02/2017    csection  2014    LAPAROSCOPIC, CHOLECYSTECTOMY  08/24/2022    Dr. Eustace Quail at [facility]       Family History:     Family History   Problem Relation Age of Onset    Hypertension Mother     Cholelithiasis Mother     Hypertension Father     Cancer Father         stomach cancer    Cholelithiasis Father     Stroke Father     Breast cancer Paternal Aunt        Social History:   Social History[1]    Allergies:   Patient has  no known allergies.    Medications:   Current Medications[2]    Review of Systems:     ROS   As per the HPI and above. The patient otherwise denies any additional changes to their otic, opthalmologic, dermatologic, pulmonary, cardiac, gastrointestinal, genitourinary, musculoskeletal, hematologic, constitutional, or psychiatric systems.      Physical Exam:     Vitals:    09/09/22 0908   BP: 129/88   Pulse: 83   Temp: 97.6 F (36.4 C)   SpO2: 97%       Appears comfortable  No labored breathing  The wound(s) demonstrate good healing and appearance. and There are no sutures or staples to remove and no incisional care needed.    Assessment:     Problem List[3]  1. Post-operative state       Plan:     Postop instructions: wound care instructions given (no further care)    Follow up: as needed    Thank you for sending this patient to [facility] for surgical treatment.  We  appreciate the opportunity to participate in their care.  Their surgical issue has been addressed, and they will be returned to your care and/or their PCP for any ongoing issues.  If you have any questions, do not hesitate to contact us at [phone].    Signed by: Ruel Favors, PA          [1]   Social History  Socioeconomic History    Marital status: Married   Tobacco Use    Smoking status: Never     Passive exposure: Never    Smokeless tobacco: Never   Vaping Use    Vaping status: Never Used   Substance and Sexual Activity    Alcohol use: Not Currently     Comment: occassionally    Drug use: Never    Sexual activity: Yes     Partners: Male     Birth control/protection: Pill     Comment: Yas     Social Determinants of Health     Financial Resource Strain: Low Risk  (08/04/2022)    Overall Financial Resource Strain (CARDIA)     Difficulty of Paying Living Expenses: Not very hard   Food Insecurity: No Food Insecurity (11/02/2021)    Hunger Vital Sign     Worried About Running Out of Food in the Last Year: Never true     Ran Out of Food in the Last  Year: Never true   Transportation Needs: No Transportation Needs (09/09/2022)    PRAPARE - Therapist, art (Medical): No     Lack of Transportation (Non-Medical): No   Physical Activity: Insufficiently Active (08/04/2022)    Exercise Vital Sign     Days of Exercise per Week: 3 days     Minutes of Exercise per Session: 30 min   Stress: No Stress Concern Present (11/02/2021)    [employer] of Occupational Health - Occupational Stress Questionnaire     Feeling of Stress : Only a little   Social Connections: Socially Integrated (11/02/2021)    Social Connection and Isolation Panel [NHANES]     Frequency of Communication with Friends and Family: More than three times a week     Frequency of Social Gatherings with Friends and Family: More than three times a week     Attends Religious Services: More than 4 times per year     Active Member of [employer] or Organizations: Yes     Attends Engineer, structural: More than 4 times per year     Marital Status: Married   Catering manager Violence: Not At Risk (06/07/2022)    Humiliation, Afraid, Rape, and Kick questionnaire     Fear of Current or Ex-Partner: No     Emotionally Abused: No     Physically Abused: No     Sexually Abused: No   Housing Stability: Low Risk  (11/02/2021)    Housing Stability Vital Sign     Unable to Pay for Housing in the Last Year: No     Number of Places Lived in the Last Year: 1     Unstable Housing in the Last Year: No   [2]   Current Outpatient Medications   Medication Sig Dispense Refill    levothyroxine (SYNTHROID) 125 MCG tablet Take 1 tablet (125 mcg) by mouth daily 90 tablet 1    lisinopril (ZESTRIL) 10 MG tablet Take 1 tablet (10 mg) by mouth daily 90 tablet 1    Syeda 3-0.03 MG  per tablet TAKE 1 TABLET BY MOUTH EVERY DAY 84 tablet 1    ondansetron (ZOFRAN-ODT) 4 MG disintegrating tablet Take 1 tablet (4 mg) by mouth every 8 (eight) hours as needed for Nausea 20 tablet 0     No current facility-administered  medications for this visit.   [3]   Patient Active Problem List  Diagnosis    Acquired hypothyroidism    DDD (degenerative disc disease), lumbar    PCOS (polycystic ovarian syndrome)    Primary hypertension    Family history of coronary artery disease in father    Elevated liver enzymes    Nonrheumatic aortic valve insufficiency    Aortic ectasia, thoracic

## 2022-10-08 ENCOUNTER — Other Ambulatory Visit (INDEPENDENT_AMBULATORY_CARE_PROVIDER_SITE_OTHER): Payer: Self-pay | Admitting: Internal Medicine

## 2022-10-08 DIAGNOSIS — E282 Polycystic ovarian syndrome: Secondary | ICD-10-CM

## 2022-10-08 NOTE — Telephone Encounter (Signed)
Refill sent.

## 2022-10-14 ENCOUNTER — Other Ambulatory Visit (INDEPENDENT_AMBULATORY_CARE_PROVIDER_SITE_OTHER): Payer: Self-pay | Admitting: Internal Medicine

## 2022-10-14 ENCOUNTER — Ambulatory Visit (INDEPENDENT_AMBULATORY_CARE_PROVIDER_SITE_OTHER): Payer: No Typology Code available for payment source | Admitting: Internal Medicine

## 2022-10-14 ENCOUNTER — Encounter (INDEPENDENT_AMBULATORY_CARE_PROVIDER_SITE_OTHER): Payer: Self-pay | Admitting: Internal Medicine

## 2022-10-14 VITALS — BP 118/86 | HR 86 | Temp 98.3°F | Ht 68.0 in | Wt 131.0 lb

## 2022-10-14 DIAGNOSIS — M79675 Pain in left toe(s): Secondary | ICD-10-CM

## 2022-10-14 DIAGNOSIS — B351 Tinea unguium: Secondary | ICD-10-CM

## 2022-10-14 MED ORDER — EFINACONAZOLE 10 % EX SOLN
1.0000 | Freq: Every evening | CUTANEOUS | 1 refills | Status: DC
Start: 2022-10-14 — End: 2022-12-18

## 2022-10-14 NOTE — Progress Notes (Signed)
Have you seen any specialists/other providers since your last visit with [place]?    Yes      The patient was informed that the following HM items are still outstanding:   Health Maintenance Due   Topic Date Due    INFLUENZA VACCINE  09/10/2022    COVID-19 Vaccine (5 - 2023-24 season) 10/11/2022

## 2022-10-14 NOTE — Progress Notes (Signed)
Subjective:      Patient ID: Peggy Kennedy is a 42 y.o. female  Chief Complaint   Patient presents with    Nail Problem     Left Big Toe   Worsening for a few months   Feeling some pain with tennis shoes during exercise   Throughout summer with sandals no pain   Now with closed toed season - pain resumed          Toe Pain   The incident occurred more than 1 week ago. There was no injury mechanism. The pain is present in the left toes. The quality of the pain is described as aching. The pain is moderate. The pain has been Worsening since onset. The symptoms are aggravated by movement, palpation and weight bearing (shoe wearing). She has tried nothing for the symptoms.     Had surgery over the summer for gall bladder pain. In July had severe pain, went to ED, has [place], showed gallstones. Had gall bladder removed.     The following portions of the patient's history were reviewed and updated as appropriate: current medications, allergies, past family history, past medical history, past social history, past surgical history and problem list.     Review of Systems   Constitutional: Negative.    Respiratory: Negative.     Cardiovascular: Negative.          BP (!) 141/96 --> 118/86  Pulse 86   Temp 98.3 F (36.8 C)   Ht 1.727 m (5\' 8" )   Wt 59.4 kg (131 lb)   LMP 10/06/2022 (Approximate)   SpO2 98%   BMI 19.92 kg/m    Objective:   Physical Exam  Vitals and nursing note reviewed.   Constitutional:       General: She is not in acute distress.     Appearance: She is well-developed.   HENT:      Head: Normocephalic.   Pulmonary:      Effort: Pulmonary effort is normal.   Musculoskeletal:        Feet:    Feet:      Left foot:      Toenail Condition: Left toenails are abnormally thick. Fungal disease present.     Comments: Lateral nail with darker brownish color, lateral with yellowish, thickened appearance.   Neurological:      Mental Status: She is alert. Mental status is at baseline.    Psychiatric:         Mood and Affect: Mood and affect normal.         Assessment:     1. Onychomycosis  Efinaconazole 10 % Solution    Referral to Podiatry      2. Pain of toe of left foot              Plan:     42 y.o. female presents for chronic, worsening toe pain with nail thickening and discoloration most likely onychomycosis.   -discussed differential and options for treatment in detail  -will start with topical treatment and advised to use diligently for several months  -if no improvement, will discuss oral treatment such as lamisil or griseo  -ensure good hygiene  -handout given in AVS  -consider kerasal to help with thickening  -if no improvement, refer to podiatry  -Risk & Benefits of the new medication(s) were explained to the patient (and family) who verbalized understanding & agreed to the treatment plan. Patient (family) encouraged to contact me/clinical staff with  any questions/concerns  -counseled on signs/symptoms to warrant re-evaluation     Follow up: prn or sooner if needed    Gust Brooms, MD

## 2022-10-14 NOTE — Telephone Encounter (Signed)
Please call pt to discuss. She can call her insurance or look into other pharmacies. [facility] heard [facility] [place] will mail this prescription for a reasonable price. Or she can wait to see podiatry

## 2022-10-28 ENCOUNTER — Other Ambulatory Visit (INDEPENDENT_AMBULATORY_CARE_PROVIDER_SITE_OTHER): Payer: Self-pay | Admitting: Internal Medicine

## 2022-10-28 ENCOUNTER — Encounter (INDEPENDENT_AMBULATORY_CARE_PROVIDER_SITE_OTHER): Payer: Self-pay | Admitting: Internal Medicine

## 2022-11-13 ENCOUNTER — Ambulatory Visit (INDEPENDENT_AMBULATORY_CARE_PROVIDER_SITE_OTHER): Payer: No Typology Code available for payment source

## 2022-11-13 DIAGNOSIS — Z23 Encounter for immunization: Secondary | ICD-10-CM

## 2022-11-13 NOTE — Progress Notes (Signed)
Vaccination administration:  Patient presented to the office for the following vaccination administration:    Influenza given in the Left upper deltoid    No reactions were noted and patient left in good condition.

## 2022-11-16 ENCOUNTER — Encounter (INDEPENDENT_AMBULATORY_CARE_PROVIDER_SITE_OTHER): Payer: Self-pay

## 2022-11-17 ENCOUNTER — Encounter (INDEPENDENT_AMBULATORY_CARE_PROVIDER_SITE_OTHER): Payer: Self-pay

## 2022-12-18 ENCOUNTER — Ambulatory Visit (INDEPENDENT_AMBULATORY_CARE_PROVIDER_SITE_OTHER): Payer: No Typology Code available for payment source | Admitting: Interventional Cardiology

## 2022-12-18 ENCOUNTER — Encounter (INDEPENDENT_AMBULATORY_CARE_PROVIDER_SITE_OTHER): Payer: Self-pay | Admitting: Interventional Cardiology

## 2022-12-18 VITALS — BP 119/85 | HR 97 | Ht 68.0 in | Wt 128.2 lb

## 2022-12-18 DIAGNOSIS — Z8249 Family history of ischemic heart disease and other diseases of the circulatory system: Secondary | ICD-10-CM

## 2022-12-18 DIAGNOSIS — I77810 Thoracic aortic ectasia: Secondary | ICD-10-CM

## 2022-12-18 DIAGNOSIS — I351 Nonrheumatic aortic (valve) insufficiency: Secondary | ICD-10-CM

## 2022-12-18 DIAGNOSIS — R002 Palpitations: Secondary | ICD-10-CM

## 2022-12-18 DIAGNOSIS — I10 Essential (primary) hypertension: Secondary | ICD-10-CM

## 2022-12-18 DIAGNOSIS — Z136 Encounter for screening for cardiovascular disorders: Secondary | ICD-10-CM

## 2022-12-18 NOTE — Progress Notes (Signed)
 [facility]    CC:    Chief Complaint   Patient presents with    Primary hypertension    Follow-up     No cardiac sx      HPI:  Peggy Kennedy is a 42 y.o. female with Hashimoto's thyroiditis, hypothyroidism, PCOS,

## 2022-12-18 NOTE — Patient Instructions (Signed)
 No medication changes today.    Return to clinic in 1 year.

## 2022-12-19 ENCOUNTER — Other Ambulatory Visit (INDEPENDENT_AMBULATORY_CARE_PROVIDER_SITE_OTHER): Payer: Self-pay | Admitting: Internal Medicine

## 2022-12-19 DIAGNOSIS — E039 Hypothyroidism, unspecified: Secondary | ICD-10-CM

## 2022-12-19 NOTE — Telephone Encounter (Signed)
 Refill sent.

## 2022-12-25 ENCOUNTER — Other Ambulatory Visit: Payer: No Typology Code available for payment source

## 2023-02-12 ENCOUNTER — Other Ambulatory Visit (INDEPENDENT_AMBULATORY_CARE_PROVIDER_SITE_OTHER): Payer: Self-pay | Admitting: Internal Medicine

## 2023-02-12 DIAGNOSIS — I10 Essential (primary) hypertension: Secondary | ICD-10-CM

## 2023-02-12 DIAGNOSIS — Z8249 Family history of ischemic heart disease and other diseases of the circulatory system: Secondary | ICD-10-CM

## 2023-02-12 NOTE — Telephone Encounter (Signed)
Rx refill sent.

## 2023-02-16 ENCOUNTER — Encounter (INDEPENDENT_AMBULATORY_CARE_PROVIDER_SITE_OTHER): Payer: Self-pay | Admitting: Internal Medicine

## 2023-02-16 DIAGNOSIS — Z1231 Encounter for screening mammogram for malignant neoplasm of breast: Secondary | ICD-10-CM

## 2023-02-19 ENCOUNTER — Telehealth (INDEPENDENT_AMBULATORY_CARE_PROVIDER_SITE_OTHER): Payer: No Typology Code available for payment source | Admitting: Internal Medicine

## 2023-02-22 ENCOUNTER — Other Ambulatory Visit (FREE_STANDING_LABORATORY_FACILITY): Payer: No Typology Code available for payment source

## 2023-02-22 DIAGNOSIS — E039 Hypothyroidism, unspecified: Secondary | ICD-10-CM

## 2023-02-22 LAB — TSH: TSH: 0.04 u[IU]/mL — ABNORMAL LOW (ref 0.35–4.94)

## 2023-02-22 LAB — T4, FREE: T4 Free: 1.07 ng/dL (ref 0.69–1.48)

## 2023-02-23 ENCOUNTER — Encounter (INDEPENDENT_AMBULATORY_CARE_PROVIDER_SITE_OTHER): Payer: Self-pay | Admitting: Internal Medicine

## 2023-02-23 DIAGNOSIS — E039 Hypothyroidism, unspecified: Secondary | ICD-10-CM

## 2023-02-24 MED ORDER — LEVOTHYROXINE SODIUM 112 MCG PO TABS
112.0000 ug | ORAL_TABLET | Freq: Every day | ORAL | 1 refills | Status: DC
Start: 2023-02-24 — End: 2023-05-19

## 2023-03-18 ENCOUNTER — Ambulatory Visit (INDEPENDENT_AMBULATORY_CARE_PROVIDER_SITE_OTHER): Payer: No Typology Code available for payment source | Admitting: Internal Medicine

## 2023-03-18 ENCOUNTER — Telehealth (INDEPENDENT_AMBULATORY_CARE_PROVIDER_SITE_OTHER): Payer: No Typology Code available for payment source | Admitting: Internal Medicine

## 2023-03-18 ENCOUNTER — Encounter (INDEPENDENT_AMBULATORY_CARE_PROVIDER_SITE_OTHER): Payer: Self-pay | Admitting: Internal Medicine

## 2023-03-18 DIAGNOSIS — G44209 Tension-type headache, unspecified, not intractable: Secondary | ICD-10-CM

## 2023-03-18 DIAGNOSIS — R221 Localized swelling, mass and lump, neck: Secondary | ICD-10-CM

## 2023-03-18 DIAGNOSIS — M542 Cervicalgia: Secondary | ICD-10-CM

## 2023-03-18 MED ORDER — CYCLOBENZAPRINE HCL 5 MG PO TABS
2.5000 mg | ORAL_TABLET | Freq: Every evening | ORAL | 0 refills | Status: AC | PRN
Start: 2023-03-18 — End: 2023-03-28

## 2023-03-18 NOTE — Progress Notes (Signed)
 Have you seen any specialists/other providers since your last visit with us ?    Yes      The patient was informed that the following HM items are still outstanding:   Health Maintenance Due   Topic Date Due    COVID-19 Vaccine (5 - 2024-25 season) 10/11/2022

## 2023-03-18 NOTE — Progress Notes (Signed)
 Subjective:      Patient ID: Peggy Kennedy is a 43 y.o. female  Chief Complaint   Patient presents with    Migraine     No hx - consistent headache, starts in the neck and goes all over the head  Super consistent this past month    Mass     Back of the neck in the center has been feeling a lump   Noted more than a month- not small lump   Feels like more when neck is bent      VIDEO VISIT  Verbal consent has been obtained from the patient to conduct a video and telephone visit to minimize exposure to COVID-19: yes      Headache   This is a chronic problem. The current episode started more than 1 year ago. The problem occurs intermittently. The problem has been gradually worsening. The pain is located in the Bilateral and temporal region. The pain does not radiate. The pain quality is similar to prior headaches. The quality of the pain is described as aching. The pain is mild. Associated symptoms include neck pain. Pertinent negatives include no dizziness. The symptoms are aggravated by activity. She has tried NSAIDs for the symptoms.     Has been going on a long time on and off. But now for the past month is almost every day. Goes to bed almost every night with a strong headache and gets worse through the day.   For at least a month feels something in her neck. Feels a mass in the back of her neck especially when she looks up. Constant     The following portions of the patient's history were reviewed and updated as appropriate: current medications, allergies, past family history, past medical history, past social history, past surgical history and problem list.     Review of Systems   Constitutional: Negative.    Musculoskeletal:  Positive for neck pain.   Neurological:  Positive for headaches. Negative for dizziness.         There were no vitals taken for this visit. VIDEO VISIT  Objective:   Physical Exam  Nursing note reviewed.   Constitutional:       General: She is not in acute  distress.     Appearance: She is well-developed.   Neck:     Pulmonary:      Effort: Pulmonary effort is normal.   Skin:     Findings: No rash.   Neurological:      General: No focal deficit present.      Mental Status: She is alert.         Assessment:     1. Neck mass  US  Head Neck Soft Tissue    cyclobenzaprine  (FLEXERIL ) 5 MG tablet      2. Neck pain  cyclobenzaprine  (FLEXERIL ) 5 MG tablet      3. Tension headache  cyclobenzaprine  (FLEXERIL ) 5 MG tablet            Plan:      43 y.o. female presents with chronic, worsening neck pain likely due to muscle spasm causing tension headaches. No red flags. Unable to examine today due to weather. Has physical in 2 weeks and plan to examine at that time.  -neck us  ordered to rule out mass  -counseled on most likely self limited nature   -Recommend heat with heat pack or hot baths/showers.   -Stretching exercises and massage    -NSAIDs  prn.   -flexeril  qhs prn   -if no improvement, consider trial medrol dose pack   -Recommend to follow up if no improvement in 1-2 weeks and would consider physical therapy referral.   -counseled on signs/symptoms to warrant re-evaluation     Follow up: 2 weeks for physical, already scheduled, or sooner if needed     Eva Roetta Shiley, MD    Time: 20 min

## 2023-03-21 ENCOUNTER — Other Ambulatory Visit (INDEPENDENT_AMBULATORY_CARE_PROVIDER_SITE_OTHER): Payer: Self-pay | Admitting: Internal Medicine

## 2023-03-21 DIAGNOSIS — E282 Polycystic ovarian syndrome: Secondary | ICD-10-CM

## 2023-03-22 ENCOUNTER — Ambulatory Visit
Admission: RE | Admit: 2023-03-22 | Discharge: 2023-03-22 | Disposition: A | Discharge status: Home / Self Care | Payer: No Typology Code available for payment source | Source: Ambulatory Visit | Attending: Internal Medicine | Admitting: Internal Medicine

## 2023-03-22 DIAGNOSIS — R221 Localized swelling, mass and lump, neck: Secondary | ICD-10-CM | POA: Insufficient documentation

## 2023-03-23 ENCOUNTER — Other Ambulatory Visit (INDEPENDENT_AMBULATORY_CARE_PROVIDER_SITE_OTHER): Payer: Self-pay | Admitting: Internal Medicine

## 2023-03-23 DIAGNOSIS — E282 Polycystic ovarian syndrome: Secondary | ICD-10-CM

## 2023-03-24 ENCOUNTER — Encounter (INDEPENDENT_AMBULATORY_CARE_PROVIDER_SITE_OTHER): Payer: Self-pay | Admitting: Internal Medicine

## 2023-03-24 MED ORDER — DROSPIRENONE-ETHINYL ESTRADIOL 3-0.03 MG PO TABS
1.0000 | ORAL_TABLET | Freq: Every day | ORAL | 0 refills | Status: DC
Start: 2023-03-24 — End: 2023-05-19

## 2023-03-24 NOTE — Telephone Encounter (Signed)
 Rx refill sent.

## 2023-03-31 ENCOUNTER — Encounter (INDEPENDENT_AMBULATORY_CARE_PROVIDER_SITE_OTHER): Payer: No Typology Code available for payment source | Admitting: Internal Medicine

## 2023-04-01 ENCOUNTER — Encounter (INDEPENDENT_AMBULATORY_CARE_PROVIDER_SITE_OTHER): Payer: Self-pay | Admitting: Surgery

## 2023-04-02 ENCOUNTER — Encounter (INDEPENDENT_AMBULATORY_CARE_PROVIDER_SITE_OTHER): Payer: Self-pay | Admitting: Surgery

## 2023-04-02 ENCOUNTER — Telehealth (INDEPENDENT_AMBULATORY_CARE_PROVIDER_SITE_OTHER): Payer: No Typology Code available for payment source | Admitting: Internal Medicine

## 2023-04-02 ENCOUNTER — Ambulatory Visit (INDEPENDENT_AMBULATORY_CARE_PROVIDER_SITE_OTHER): Payer: No Typology Code available for payment source | Admitting: Surgery

## 2023-04-02 VITALS — BP 138/84 | HR 79 | Temp 98.0°F | Ht 68.0 in | Wt 132.0 lb

## 2023-04-02 DIAGNOSIS — R221 Localized swelling, mass and lump, neck: Secondary | ICD-10-CM | POA: Insufficient documentation

## 2023-04-02 NOTE — Progress Notes (Signed)
 Peggy Kennedy Sumaya Riedesel is a 43 y.o. female with a PMH of Hashimoto Thyroiditis and tension headaches presenting with a chief complaint of a posterior neck mass for one month. She noticed the mass approximately one month ago while massaging her neck for tension headaches. She localizes the mass to the right of the midline. The mass does not cause her pain, although isn't sure if it is contributing to the headaches, and only protrudes when the neck is flexed.     Patient had an ultrasound on 03/24/23 which showed a structure approximately 0.5 cm deep to the skin with solid and cystic structure measuring 0.5 x 0.4 x 0.3 cm.     Denies fever, chills, vision changes, hearing changes, or dysphagia.     Medical History[1]  Past Surgical History[2]  Social History     Occupational History    Not on file   Tobacco Use    Smoking status: Never     Passive exposure: Never    Smokeless tobacco: Never   Vaping Use    Vaping status: Never Used   Substance and Sexual Activity    Alcohol use: Not Currently     Comment: occassionally    Drug use: Never    Sexual activity: Yes     Partners: Male     Birth control/protection: Pill     Comment: Yas     Family History[3]  Allergies[4]  Medications Taking[5]  Review of Systems   Constitutional:  Negative for chills and fever.   HENT:  Negative for hearing loss and sore throat.    Eyes:  Negative for blurred vision and double vision.   Respiratory:  Negative for cough and shortness of breath.    Cardiovascular:  Negative for chest pain and palpitations.   Gastrointestinal:  Negative for constipation, diarrhea, nausea and vomiting.       Vitals:    04/02/23 1236   BP: 138/84   Pulse: 79   Temp: 98 F (36.7 C)   SpO2: 98%     Height:  1.727 m (5' 8)  Weight: 59.9 kg (132 lb)  Body mass index is 20.07 kg/m.    Blood Pressure Screening  Patient's blood pressure is BP: 138/84. Patient is currently on Lisinopril .     Physical Exam  Constitutional:       Appearance: Normal  appearance.   HENT:      Head: Normocephalic and atraumatic.   Eyes:      Extraocular Movements: Extraocular movements intact.      Conjunctiva/sclera: Conjunctivae normal.   Neck:        Comments: 2cm, ovoid, borders well defined, fixed at the inferior surface, overlying skin is mobile, non-tender mass in posterior neck, right of the midline. Palpable only when neck is flexed and the muscle is lax.   Cardiovascular:      Rate and Rhythm: Normal rate and regular rhythm.   Pulmonary:      Effort: Pulmonary effort is normal.   Musculoskeletal:         General: Normal range of motion.      Cervical back: Normal range of motion and neck supple. No pain with movement.   Neurological:      Mental Status: She is alert.   Psychiatric:         Mood and Affect: Mood normal.       Lab Results   Component Value Date    WBC 5.56 06/08/2022    HGB  12.7 06/08/2022    HCT 37.3 06/08/2022    PLT 226 06/08/2022    CHOL 159 06/08/2022    TRIG 82 06/08/2022    HDL 43 06/08/2022    LDL 100 (A) 06/08/2022    ALT 27 06/08/2022    AST 20 06/08/2022    NA 140 06/08/2022    K 4.2 06/08/2022    CL 108 06/08/2022    CREAT 0.8 06/08/2022    BUN 7.0 06/08/2022    CO2 24 06/08/2022    TSH 0.04 (L) 02/22/2023    GLU 79 06/08/2022     Radiology: U/S on 03/24/23 showed approximately 0.5 cm deep to the skin there is a solid and cystic structure measuring approximately 0.5 cm x 0.4 cm x 0.3 cm. There is no internal vascularity identified. No calcification or simple cystic component is seen.    In office U/S showed possible intramuscular structure. Difficult to image completely.    Assessment   Peggy Kennedy is a 43 year old female with a posterior neck mass right of the midline that protrudes with neck flexion but is otherwise asymptomatic.    Problem List[6]    Plan   We explained that definitive treatment would be excision of the lesion. We discussed that while this could be done in office, as it is difficult to know with  ultrasound alone the depth and involvement of the mass with nearby structures, specifically whether en block muscle will need to be resected. we may need to create a larger incision. Recommended performing the procedure in the OR under general anesthesia. Discussed R/B of the procedure.   After our discussion, patient opted for surgical excision in the OR for which she will be scheduled in the coming weeks.     No orders of the defined types were placed in this encounter.    Roselyn Coe, MS-3    I was present with the medical student while the E/M service was performed. I have performed (or re-performed) the physical exam and medical decision making documented by the student. I agree with and have verified that the student documentation is accurate, or have edited the note.      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 Wilhelminia at [facility] [address]   [3]   Family History  Problem Relation Name Age of Onset    Hypertension Mother Father     Cholelithiasis Mother Father     Hypertension Father Richie     Cancer Father Richie         stomach cancer    Cholelithiasis Father Richie     Stroke Father Gilberto  Breast cancer Paternal Aunt     [4] No Known Allergies  [5]   Outpatient Medications Marked as Taking for the 04/02/23 encounter (Office Visit) with Gleen Reena CROME, MD   Medication Sig Dispense Refill     drospirenone -ethinyl estradiol  (Syeda ) 3-0.03 MG per tablet Take 1 tablet by mouth daily 84 tablet 0    levothyroxine  (SYNTHROID ) 112 MCG tablet Take 1 tablet (112 mcg) by mouth daily 100 tablet 1    lisinopril  (ZESTRIL ) 10 MG tablet TAKE 1 TABLET (10 MG) BY MOUTH DAILY. 90 tablet 0   [6]   Patient Active Problem List  Diagnosis    Acquired hypothyroidism    DDD (degenerative disc disease), lumbar    PCOS (polycystic ovarian syndrome)    Primary hypertension    Family history of coronary artery disease in father    Elevated liver enzymes    Nonrheumatic aortic valve insufficiency    Aortic ectasia, thoracic    Mass of right side of neck

## 2023-04-05 ENCOUNTER — Encounter (INDEPENDENT_AMBULATORY_CARE_PROVIDER_SITE_OTHER): Payer: Self-pay | Admitting: Internal Medicine

## 2023-04-05 ENCOUNTER — Encounter (INDEPENDENT_AMBULATORY_CARE_PROVIDER_SITE_OTHER): Payer: Self-pay

## 2023-04-05 ENCOUNTER — Ambulatory Visit: Payer: No Typology Code available for payment source | Attending: Internal Medicine

## 2023-04-05 DIAGNOSIS — Z1231 Encounter for screening mammogram for malignant neoplasm of breast: Secondary | ICD-10-CM | POA: Insufficient documentation

## 2023-04-05 NOTE — Progress Notes (Signed)
 04/05/2023 at 959am-.Marland KitchenPosting sheet was received via Teams.  Surgery was scheduled for 05/10/23 at 1030am at [facility] with Baruch Goldmann.  Surgery instructions were sent via Pt mychart.  Post-op appointment was scheduled. Pts questions were addressed.

## 2023-04-06 NOTE — Progress Notes (Signed)
 Pt called in to request a call back for mammo results. Please advice. Thank you.

## 2023-04-06 NOTE — Telephone Encounter (Signed)
Awaiting provider review.

## 2023-04-12 ENCOUNTER — Other Ambulatory Visit: Payer: Self-pay | Admitting: Internal Medicine

## 2023-04-12 ENCOUNTER — Telehealth (INDEPENDENT_AMBULATORY_CARE_PROVIDER_SITE_OTHER): Payer: Self-pay | Admitting: Internal Medicine

## 2023-04-12 DIAGNOSIS — R928 Other abnormal and inconclusive findings on diagnostic imaging of breast: Secondary | ICD-10-CM

## 2023-04-12 NOTE — Telephone Encounter (Signed)
 [facility] radiology called stated patient needs the following orders placed.    Left [place] Biaopsy for mass  Stereotactic biopsy 2 site for calcifications    Please Advise appt 3/25

## 2023-04-13 ENCOUNTER — Telehealth (INDEPENDENT_AMBULATORY_CARE_PROVIDER_SITE_OTHER): Payer: Self-pay | Admitting: Internal Medicine

## 2023-04-13 ENCOUNTER — Ambulatory Visit (INDEPENDENT_AMBULATORY_CARE_PROVIDER_SITE_OTHER): Payer: No Typology Code available for payment source

## 2023-04-13 ENCOUNTER — Encounter (INDEPENDENT_AMBULATORY_CARE_PROVIDER_SITE_OTHER): Payer: Self-pay

## 2023-04-13 NOTE — Telephone Encounter (Signed)
 Copied from telephone encounter 04/13/23:    Pt would like a call back from PCP in regards to recent Mammo and left breast US  results Pt also needs an order for a breast biopsy. Pt is scheduled for imaging on 05/04/2023, however she would like order ASAP to find another location that will do it sooner. Please advise, thank you.         Phone: [phone]

## 2023-04-13 NOTE — Telephone Encounter (Signed)
 Pt would like a call back from PCP in regards to recent Mammo and left breast US  results Pt also needs an order for a breast biopsy. Pt is scheduled for imaging on 05/04/2023, however she would like order ASAP to find another location that will do it sooner. Please advise, thank you.       Phone: [phone]

## 2023-04-13 NOTE — Telephone Encounter (Signed)
 Orders placed and sent pt mychart result message.    Peggy Kennedy ,   The radiology team said they discussed this with you already, but I wanted to check in too. Your additional pictures still show  abnormalities including a mass and calcifications. Given these findings, biopsies are recommended. I know it's super scary, but would try to just take it one step at a time and try not to let your mind go to worse case scenario, bc that is frequently not the case. I've had to have the stereotactic biopsy before, so I totally know the feeling. Most of these biopsies end up benign. On the chance that it is breast cancer, it would hopefully be early stage and we have an amazing team that would take amazing care of you. But like I said, there's a huge chance that won't be the case! But we will be here to support you every step of the way. I placed the orders and hopefully we can get the biopsy scheduled soon. And usually the result comes back a week or so later and we'll take it from there.    Hang in there. Would be happy to discuss in person or by video in more detail if that helps. We're here for whatever you need. Keep me updated     Eva Roetta Shiley, MD

## 2023-04-13 NOTE — PSS Phone Screening (Signed)
 Pre-Anesthesia Evaluation    Pre-op phone visit requested by:   Reason for pre-op phone visit: Patient anticipating EXCISION OF POSTERIOR NECK MASS,RIGHT procedure.         No orders of the defined types were placed in this encounter.    Abn TFTs on 02/22/23.  Synthroid  dose adjusted from 125mcg to 112 mcg.  Pt will have repeat labs done at [facility] on 04/15/23.     Cardiology LOV on 12/18/22.  EKG tracing 01/12/22 under Media.    History of Present Illness/Summary:        Problem List:  Medical Problems       Hospital Problem List  Date Reviewed: 04/02/2023   None        Non-Hospital Problem List  Date Reviewed: 04/02/2023            ICD-10-CM Priority Class Noted Diagnosed    Acquired hypothyroidism E03.9   07/03/2018     Overview Addendum 02/27/2022  8:46 AM by Kathaleen Eva RAMAN, MD     Has Hashimoto's thyroiditis, has hypothyroidism. Takes levothyroxine  112mcg. Since she had son who is 2.5yo it has been all over the place. Has felt it was messed up since then. Would like to recheck. Also feels that there's something in her throat.     Since last visit: TSH high last check. Dose increase Would like to recheck. Due for refill of the 137mcg.     Lab Results   Component Value Date    TSH 7.31 (H) 12/03/2021    T4FREE 1.04 12/03/2021       Lab Results   Component Value Date    TSH 0.02 (L) 07/08/2021    T4FREE 1.28 07/08/2021     Lab Results   Component Value Date    TSH 0.02 (L) 07/08/2021    T4FREE 1.28 07/08/2021        Lab Results   Component Value Date    TSH 0.02 (L) 07/08/2021    T4FREE 1.28 07/08/2021             DDD (degenerative disc disease), lumbar M51.369   07/03/2018     Overview Addendum 02/27/2022  8:53 AM by Kathaleen Eva RAMAN, MD     Feels like it continues and will likely need surgery at some point. But feels the difficulty with weight loss is not helping.     Since last visit: continues with pain. Needs to have surgery, but has to decide when it works for her schedule.          PCOS (polycystic ovarian  syndrome) E28.2   08/09/2018     Overview Signed 02/27/2022  8:55 AM by Kathaleen Eva RAMAN, MD     Symptoms of acne and irregular menses controlled with change in OCP and now on yasmin . Sees GYN         Primary hypertension I10   12/03/2021     Overview Addendum 02/27/2022  8:47 AM by Kathaleen Eva RAMAN, MD     Likely new diagnosis.   Got home machine and has checked the readings. Tends to be 130-140s/90s.   Does have family history. Dad diagnosed in his 27s, mom also and was diagnosed in 31s. Brother in his 23s also diagnosed.     Since last visit: doing well. Did see cardiology and has some studies pending.     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 Always use your most recent med list.                drospirenone -ethinyl estradiol  3-0.03 MG per tablet  Take 1 tablet by mouth daily  Commonly known as: Syeda   Medication Adjustments for Surgery: Take as prescribed     levothyroxine  112 MCG tablet  Take 1 tablet (112 mcg) by mouth daily  Commonly known as: SYNTHROID   Medication Adjustments for Surgery: Take as  prescribed     lisinopril  10 MG tablet  TAKE 1 TABLET (10 MG) BY MOUTH DAILY.  Commonly known as: ZESTRIL   Medication Adjustments for Surgery: Take as prescribed            Allergies[2]  Family History[3]  Social History     Occupational History    Not on file   Tobacco Use    Smoking status: Never     Passive exposure: Never    Smokeless tobacco: Never   Vaping Use    Vaping status: Never Used   Substance and Sexual Activity    Alcohol use: Not Currently     Comment: occassionally    Drug use: Never    Sexual activity: Yes     Partners: Male     Birth control/protection: Pill     Comment: Yas       Menstrual History:   LMP / Status  Oral contraceptive      No LMP recorded. (Menstrual status: Oral contraceptive ).    Tubal Ligation?  No valid surgical or medical questions entered.           Exam Scores:   SDB score           STBUR score       PONV score  Nausea Risk: VERY SEVERE RISK    MST score  MST Score: 0    PEN-FAST score       Frailty score       CHADsVasc            Visit Vitals  Ht 1.702 m (5' 7)   Wt 63.5 kg (140 lb)   BMI 21.93 kg/m  Recent Labs   Other (last 180 days) 02/22/23  1420   TSH 0.04*                      [1]   Past Surgical History:  Procedure Laterality Date    CESAREAN SECTION  02/2017    csection  2014    LAPAROSCOPIC, CHOLECYSTECTOMY  08/24/2022    Dr. [facility]helminia at [facility]   [2] No Known Allergies  [3]   Family History  Problem Relation Name Age of Onset    Hypertension Mother Father     Cholelithiasis Mother Father     Hypertension Father Richie     Cancer Father Richie         stomach cancer    Cholelithiasis Father Richie     Stroke Father Richie     Breast cancer Paternal Aunt

## 2023-04-13 NOTE — Addendum Note (Signed)
 Addended by: Mariann Laster on: 04/13/2023 05:40 PM     Modules accepted: Orders

## 2023-04-13 NOTE — Telephone Encounter (Signed)
 Called and spoke to patient regarding Mychart message. Patient informed staff that she needs an order for a diagnostic breast biopsy. Orders needed:    Left US Biopsy for mass  Stereotactic biopsy 2 site for calcifications

## 2023-04-13 NOTE — Telephone Encounter (Signed)
 See telephone encounter on 04/12/23

## 2023-04-15 ENCOUNTER — Encounter (INDEPENDENT_AMBULATORY_CARE_PROVIDER_SITE_OTHER): Payer: Self-pay | Admitting: Internal Medicine

## 2023-04-15 ENCOUNTER — Telehealth (INDEPENDENT_AMBULATORY_CARE_PROVIDER_SITE_OTHER): Admitting: Internal Medicine

## 2023-04-15 ENCOUNTER — Other Ambulatory Visit (FREE_STANDING_LABORATORY_FACILITY)

## 2023-04-15 DIAGNOSIS — N6325 Unspecified lump in the left breast, overlapping quadrants: Secondary | ICD-10-CM

## 2023-04-15 DIAGNOSIS — R921 Mammographic calcification found on diagnostic imaging of breast: Secondary | ICD-10-CM

## 2023-04-15 DIAGNOSIS — R221 Localized swelling, mass and lump, neck: Secondary | ICD-10-CM

## 2023-04-15 DIAGNOSIS — E039 Hypothyroidism, unspecified: Secondary | ICD-10-CM

## 2023-04-15 DIAGNOSIS — O34219 Maternal care for unspecified type scar from previous cesarean delivery: Secondary | ICD-10-CM | POA: Insufficient documentation

## 2023-04-15 NOTE — Progress Notes (Signed)
 Have you seen any specialists/other providers since your last visit with us ?    Yes  -radiologist       The patient was informed that the following HM items are still outstanding:   Health Maintenance Due   Topic Date Due    COVID-19 Vaccine (5 - 2024-25 season) 10/11/2022

## 2023-04-15 NOTE — Progress Notes (Signed)
 [facility]   OFFICE VISIT            Telehealth:  The Patient has given verbal consent for delivery of health care via telehealth.   The patient is located at Other: car  in [place]   The encounter provider is located at their Medical Office in [place]   Epic Video Client was utilized for Real Time/Synchronous Telehealth.   The time spent in medical discussion during this visit was 32 minutes.        HPI     Chief Complaint   Patient presents with    Breast Problem     Abnormal mammo       HPI  History of Present Illness       Here to discuss recent abnormal mammo. Has biopsies scheduled.   She has excision of the neck mass scheduled 3/31.       ROS   Review of Systems see above    Vital Signs   There were no vitals taken for this visit. VIDEO    Physical Exam   Physical Exam  Constitutional:       General: She is not in acute distress.     Appearance: She is well-developed.   Pulmonary:      Effort: Pulmonary effort is normal.   Skin:     Findings: No rash.   Neurological:      General: No focal deficit present.      Mental Status: She is alert.             Mammo Digital Additional Views Left  Narrative: [facility]  [facility]    Peggy, Kennedy PERFORMED AT:  [id number]       DOB:1980/08/13                 [facility] Radiology  04/12/2023     AGE:43   Gender:F              [facility]                                                [address], [address]                                                [place], [place] [address] [phone]                                                Physicians Only: [phone]         EVA GORMAN SHILEY MD                                 [W,H]       [address]       [place], [place] [address]    ULTRASOUND LEFT BREAST, LIMITED  LEFT  DIGITAL MAMMOGRAM ADDITIONAL VIEWS    ADDENDED BY: Carolynne Sovereign M.D.  04/12/23    ** ADDENDUM #1 TO REPORT OF 04/12/23 **    The  results and recommendations were discussed with the PAA Jocelyn J in  the office of Shaquil Aldana SAAD Yazlynn Birkeland MD on 04/12/2023 at 2:14 PM by MA Elveria HERO from our office.      ** ORIGINAL REPORT **    ULTRASOUND LEFT BREAST, LIMITED  LEFT DIGITAL MAMMOGRAM ADDITIONAL VIEWS    HISTORY: Call back left breast calcifications and distortion    COMPARISON: 04/05/2023.    PARENCHYMAL PATTERN:  The breasts are heterogeneously dense, which may  obscure small masses.    TECHNIQUE: Left digital diagnostic mammogram with CAD and 3D tomosynthesis  performed.    FINDINGS:      MAMMOGRAM:  Magnification imaging demonstrates 10 mm span of grouped heterogeneous  calcifications in the upper outer mid depth breast with similar findings in  the retroareolar breast.    ULTRASOUND:  Targeted ultrasound of left breast performed to evaluate the questioned  mammographic finding.  There is an irregular shadowing mass at 2:00, 4 cm  from the nipple. The lesion measures 0.9 x 0.7 x 0.5 cm. Prominent  vascularity is seen along the anterior and lateral margins. Normal left  axillary lymph nodes.  Impression:      1. Suspicious left 2:00 shadowing sonographic mass. Recommend  ultrasound-guided needle core biopsy.  2. Indeterminant calcifications in the left retroareolar and upper outer  mid depth breast. Recommend 2 site stereotactic biopsy for further  assessment.    The results and recommendations were discussed with the patient and will  contact the office of Sharah Finnell SAAD Beatrix Breece MD.  Your patient will be  tentatively scheduled for ultrasound and 2 site stereotactic biopsy.    The patient has received breast biopsy preparation information and may  reach our navigators/facilitators with any additional questions.    [facility] Navigator: [phone]  [facility] Navigator: [phone]  [facility] Navigator: [phone]  [facility] Facilitator: [phone]    BIRADS CATEGORY  04-SUSPICIOUS FINDING-  Biopsy Should Be Considered    Electronically signed by: Carolynne Sovereign M.D.  [facility]  [facility]  [facility]  Narrative: [facility]  [facility]    Peggy, Kennedy PERFORMED AT:  [id number]       DOB:08-22-1980                 [facility] Radiology  04/12/2023     AGE:43   Gender:F              [facility]                                                [address], [address]                                                [place], [place] [address] [phone]  Physicians Only: [phone]         EVA GORMAN SHILEY MD                                 [W,H]       [address]       [place], [place] [address]    ULTRASOUND LEFT BREAST, LIMITED  LEFT DIGITAL MAMMOGRAM ADDITIONAL VIEWS    ADDENDED BY: Carolynne Sovereign M.D.  04/12/23    ** ADDENDUM #1 TO REPORT OF 04/12/23 **    The results and recommendations were discussed with the PAA Jocelyn J in  the office of Jenness Stemler SAAD Esley Brooking MD on 04/12/2023 at 2:14 PM by MA Elveria HERO from our office.      ** ORIGINAL REPORT **    ULTRASOUND LEFT BREAST, LIMITED  LEFT DIGITAL MAMMOGRAM ADDITIONAL VIEWS    HISTORY: Call back left breast calcifications and distortion    COMPARISON: 04/05/2023.    PARENCHYMAL PATTERN:  The breasts are heterogeneously dense, which may  obscure small masses.    TECHNIQUE: Left digital diagnostic mammogram with CAD and 3D tomosynthesis  performed.    FINDINGS:      MAMMOGRAM:  Magnification imaging demonstrates 10 mm span of grouped heterogeneous  calcifications in the upper outer mid depth breast with similar findings in  the retroareolar breast.    ULTRASOUND:  Targeted ultrasound of left breast performed to evaluate the questioned  mammographic finding.  There is an irregular shadowing mass at 2:00, 4 cm  from the nipple. The lesion measures 0.9 x 0.7 x  0.5 cm. Prominent  vascularity is seen along the anterior and lateral margins. Normal left  axillary lymph nodes.  Impression:      1. Suspicious left 2:00 shadowing sonographic mass. Recommend  ultrasound-guided needle core biopsy.  2. Indeterminant calcifications in the left retroareolar and upper outer  mid depth breast. Recommend 2 site stereotactic biopsy for further  assessment.    The results and recommendations were discussed with the patient and will  contact the office of Corry Storie SAAD Dessie Tatem MD.  Your patient will be  tentatively scheduled for ultrasound and 2 site stereotactic biopsy.    The patient has received breast biopsy preparation information and may  reach our navigators/facilitators with any additional questions.    [facility] Navigator: [phone]  [facility] Navigator: [phone]  [facility] Navigator: [phone]  [facility] Facilitator: [phone]    BIRADS CATEGORY 04-SUSPICIOUS FINDING-  Biopsy Should Be Considered    Electronically signed by: Carolynne Sovereign M.D.  [facility]     Assessment/Plan     Assessment & Plan  Mass overlapping multiple quadrants of left breast         Breast calcifications         Neck mass         43 y.o. female presents for follow up abnormal mammogram.   -discussed next steps in detail  -US  guided biopsy tomorrow  -Stereotactic biopsies next week  -refer to breast surgeon pending result  -neck mass excision scheduled 05/10/2023  -counseled on resources for support  -counseled on signs/symptoms to warrant re-evaluation    Eva Roetta Shiley, MD     The following activities were performed on the date of service:  preparing to see the patient: - chart review   - review of prior imaging tests  counseling and educating the patient/family/caregiver  documenting clinical information in the electronic or other health records    Total time spent performing  activities on date of service:  33  minutes

## 2023-04-15 NOTE — Progress Notes (Signed)
 Patient had appointment today.

## 2023-04-15 NOTE — Telephone Encounter (Signed)
 Patient had appointment today on 04/15/23

## 2023-04-16 ENCOUNTER — Other Ambulatory Visit (INDEPENDENT_AMBULATORY_CARE_PROVIDER_SITE_OTHER): Payer: Self-pay | Admitting: Internal Medicine

## 2023-04-16 LAB — TSH: TSH: 0.36 u[IU]/mL (ref 0.35–4.94)

## 2023-04-16 LAB — T4, FREE: T4 Free: 1.13 ng/dL (ref 0.69–1.48)

## 2023-04-21 ENCOUNTER — Other Ambulatory Visit: Payer: Self-pay | Admitting: Internal Medicine

## 2023-04-22 ENCOUNTER — Encounter (INDEPENDENT_AMBULATORY_CARE_PROVIDER_SITE_OTHER): Payer: Self-pay | Admitting: Internal Medicine

## 2023-04-26 ENCOUNTER — Encounter (INDEPENDENT_AMBULATORY_CARE_PROVIDER_SITE_OTHER): Payer: Self-pay

## 2023-04-27 ENCOUNTER — Encounter (INDEPENDENT_AMBULATORY_CARE_PROVIDER_SITE_OTHER): Payer: Self-pay | Admitting: Internal Medicine

## 2023-04-27 DIAGNOSIS — N6325 Unspecified lump in the left breast, overlapping quadrants: Secondary | ICD-10-CM

## 2023-04-27 DIAGNOSIS — R921 Mammographic calcification found on diagnostic imaging of breast: Secondary | ICD-10-CM

## 2023-05-03 ENCOUNTER — Encounter: Payer: Self-pay | Admitting: Surgery

## 2023-05-03 ENCOUNTER — Telehealth: Payer: Self-pay

## 2023-05-03 NOTE — Telephone Encounter (Addendum)
 Who is providing the information:    [x] Patient               [] Other (enter contact information)     Reason for Visit:    [] Breast Cancer - new diagnosis  [] Breast Pain  [] Breast Surgery Consult (fibroadenoma, papilloma, radial scar, ALH, ADH, LCIS)  [x] Lump or Mass without biopsy (schedule first visit with NP or document why not seeing NP first)  [] Other     Location Preference:    [] [place]               [] [place]               [x] [place]                   [] [place]                  [] [place]       [] First Available    Physician/APP Preference:    [] Hillery Aldo, MD           [] Anner Crete, MD  [] Tilda Burrow, MD       [] Henderson Baltimore, MD            [] Jacqualine Mau, MD               [] Molli Hazard, MD                [x] Darla Lesches, MD                [] Cleotis Lema, MD               [] First Available     [] Morene Antu, NP               [] Nolon Lennert, NP  [] Antionette Poles, NP              [] Thurmond Butts, NP     Referral:    Referring Provider: Raeanne Barry, MD     Is an insurance referral required? No      Does the patient need to be referred to the Oncology Financial Navigator? No       Personal History:    Personal history of Breast Cancer: No   (if yes, fill out information below)    Site [] Left  [] Right  [] Bilateral Date:     Surgical Intervention [] Yes  [] No If Yes:  [] Lumpectomy  [] Mastectomy  Date:  Location:    Non-surgical Intervention [] Yes  [] No [] Prior Chemotherapy  [] Prior Radiation   Hormone Receptor Testing [] Yes  [] No If Yes:  Date:  Location:   FISH Testing [] Yes  [] No If Yes:  Date:  Location:     Breast Surgery History:    Has the patient had breast surgery? No (if yes, fill out information below)    Mastectomy [] Yes     [] Right     [] Left     [] Bilateral  [] No If yes:  Date:  Facility: Postsurgical testing:  [] Receptor      [] ER+      [] PR+      [] HER2+  [] Oncotype  [] None   Lumpectomy [] Yes     [] Right     [] Left     [] Bilateral  [] No If  yes:  Date(s):  Facility: Postsurgical testing:  [] Receptor      [] ER+      [] PR+      [] HER2+  [] Oncotype  [] None     Personal history of Ovarian  Cancer: No   (if yes, fill out information below)    Date:     [] Chemotherapy  [] Radiation  [] Neither    Personal history of any other type of cancer: No (if yes, fill out information below)    Date:                               Type of Cancer:  [] Chemotherapy  [] Radiation  [] Neither     Family History:    Family history of breast cancer: Paternal aunt     Family history of ovarian cancer:     Family history of any other type of cancer: Father - stomach cancer;  Maternal grandfather - lung cancer; Paternal grandfather - skin and liver cancer (if yes, document information below)    Ashkenazi Jewish Ancestry: No     Genetic Testing:    Has the patient had any genetic testing done? No     Imaging History:    Mammogram [x] Yes  [] No If yes:  Date: 2/24 & 3/3  Facility:[facility]   Breast MRI [] Yes  [] No If yes:  Date:  Facility:   Breast Ultrasound [x] Yes  [] No If yes:  Date:3/3  Facility:[facility]        Breast Biopsy History:    Has the patient ever had a breast biopsy? Yes (if yes, fill out information below)    Ultrasound-Guided Biopsy [x] Yes      [] Right      [x] Left  [] No If yes:  Date(s): 3/7  Facility where done:[facility]   MRI-Guided Biopsy [] Yes      [] Right      [] Left  [] No If yes:  Date(s):  Facility where done:   Stereotactic Biopsy  [x] Yes       [] Right       [] Left  [] No If yes:  Date(s):3/12  Facility where done:[facility]   Surgical Biopsy [] Yes      [] Right      [] Left  [] No If Yes:  Date(s):  Facility where done:   Core Needle Biopsy [] Yes      [] Right      [] Left  [] No If Yes:  Date(s):  Facility where done:   Unknown type [] Yes      [] Right      [] Left  [] No If yes:  Date(s):  Facility where done:        Other:    Is a Release of Information form needed by the patient to acquire records/imaging/pathology? No      ROI form sent to patient - No      Patient advised of need to have  records in office prior to appointment? No        Need to obtain results. Once received will call patient to set up an appointment    Records received and reviewed with Jude - Appointment scheduled with Dr. Delbert Phenix 4/25.

## 2023-05-04 ENCOUNTER — Other Ambulatory Visit (INDEPENDENT_AMBULATORY_CARE_PROVIDER_SITE_OTHER): Payer: Self-pay

## 2023-05-04 ENCOUNTER — Encounter (INDEPENDENT_AMBULATORY_CARE_PROVIDER_SITE_OTHER): Payer: Self-pay | Admitting: Internal Medicine

## 2023-05-04 ENCOUNTER — Ambulatory Visit (INDEPENDENT_AMBULATORY_CARE_PROVIDER_SITE_OTHER): Payer: Self-pay | Admitting: Internal Medicine

## 2023-05-04 DIAGNOSIS — R928 Other abnormal and inconclusive findings on diagnostic imaging of breast: Secondary | ICD-10-CM

## 2023-05-04 NOTE — Progress Notes (Unsigned)
 She had two biopsies. One was ultrasound and one was stereotactic. One result was in my folder that I gave to [place] and needed to be scanned to the order. The other also needed to be rescanned to the ultrasound guided biopsy order.

## 2023-05-04 NOTE — Addendum Note (Signed)
 Addended by: Zella Ball on: 05/04/2023 03:46 PM     Modules accepted: Orders

## 2023-05-04 NOTE — Telephone Encounter (Signed)
 Pathology reports scanned to orders

## 2023-05-04 NOTE — Addendum Note (Signed)
 Addended by: Zella Ball on: 05/04/2023 03:49 PM     Modules accepted: Orders

## 2023-05-05 ENCOUNTER — Other Ambulatory Visit (HOSPITAL_BASED_OUTPATIENT_CLINIC_OR_DEPARTMENT_OTHER): Payer: Self-pay

## 2023-05-05 DIAGNOSIS — R928 Other abnormal and inconclusive findings on diagnostic imaging of breast: Secondary | ICD-10-CM

## 2023-05-07 ENCOUNTER — Telehealth (INDEPENDENT_AMBULATORY_CARE_PROVIDER_SITE_OTHER): Admitting: Internal Medicine

## 2023-05-07 NOTE — Discharge Instr - AVS First Page (Addendum)
 Surgery Aftercare    YOUR PROCEDURE:  You have had a mass removed from your neck.    CONTACTING US  AFTER SURGERY:  If you have any problems or questions after discharge, or need to schedule your follow-up appointment with Dr. Gleen for 2-3 weeks after your procedure, please call our clinic office during business hours (8:30am-5pm) at [phone]. For problems after hours, the office number will reach the answering service, who will contact the doctor on call.  If you have difficulty contacting the answering service, call [phone] and ask to have  Dr. Gleen or the covering General Surgeon on call paged.  For a medical emergency, call 911.    HOME CARE INSTRUCTIONS AFTER YOUR SURGERY:  You may do most activities, except those that hurt your incision site.  You have special glue or tape on your skin that is protecting your incision.  It will start to fall off on its own in 2-3 weeks.  The stitches under the skin will dissolve. Keep the wound dry and clean. You may shower 24 hours after surgery. The wound may be washed gently with soap and water . Gently blot or dab dry following cleansing. Do not take baths, use swimming pools, or use hot tubs for ten days.   Once home, an ice pack applied to the operative site may help with discomfort and keep swelling down.  You can apply ice as often as needed.  After several days heat may help.  Many people find that Nonsteroidal Antiinflammatory drugs will help relieve their discomfort very well, without the narcotic side effects, especially if they are taken routinely for the first few days after surgery.  You may choose to take:  Ibuprofen  (Advil , Motrin ): 600-800mg  (3-4 of the over-the-counter pills) every 8 hours around-the-clock, with some food)  Naproxen (Aleve, Naprosyn): 250-500mg  (1-2 pills) every 12 hours around-the-clock, with some food  Celecoxib (Celebrex): 100-200mg  twice a day, around-the-clock  Acetaminophen  (Tylenol ): 650mg -1gm every 6 hours  Please check  with your physician before using these medications if you have chronic liver or kidney diseases    One effective way to manage your non-narcotic pain medication is to alternate your medication every 4 hours between acetaminophen  and ibuprofen .  Sample NSAID post-operative medication schedule:  6 am 10 am 2 pm 6 pm 10 pm (2 am)   400-800 mg ibuprofen    (Advil , Motrin ) [id number] mg acetaminophen  (Tylenol ) 400-800 mg ibuprofen  (Advil , Motrin ) [id number] mg acetaminophen  (Tylenol ) 400-800 mg ibuprofen  (Advil , Motrin ) [id number] mg acetaminophen  (Tylenol )         CALL US  IF:  There is redness, swelling, or increasing pain from a wound.   There is pus coming from a wound.   There is drainage from a wound lasting longer than one day.  An unexplained oral temperature above 102 F (38.9 C) develops.  You notice a foul smell coming from a wound or dressing.  There is a breaking open of the wound (edges not staying together).  You notice increasing pain in the right lower part of your abdomen.  You notice increasing pain in the shoulders (shoulder strap areas) or at the operative site.  You develop dizzy episodes or fainting while standing.  You develop persistent nausea or vomiting.    SEEK IMMEDIATE MEDICAL CARE IF:  You develop a rash.  You have difficulty breathing.  You develop any reaction or side effects to medications given.       =========================================================================    Anesthesia Discharge Instructions: After Your Surgery  You've just had surgery. During surgery you were given medicine called anesthesia to keep you relaxed and comfortable. After surgery you may have some pain or nausea. This is common. Your doctor or nurse will show you how to take care of yourself when you go home. He or she will also answer your questions. Here are some tips for feeling better and getting well after surgery.    For the first 24 hours after your surgery:  Do not drive or use heavy equipment.   Do not  make important decisions or sign legal papers.  Do not drink alcohol.  Have someone stay with you, if needed. He or she can watch for problems and help keep you safe.  Have an adult family member or friend drive you home.    Managing pain  If you have pain after surgery, pain medicine will help you feel better. Take it as ordered by your surgeon, before pain becomes severe. Also, ask your doctor about other ways to control pain. This might be with rest, ice, repositioning, and elevation. Follow any other instructions your surgeon or nurse gives you.    Tips for taking pain medicine:    Stay on schedule with your medication  Most pain relievers taken by mouth need at least 20 to 30 minutes to start to work.  Taking medicine on a schedule can help you remember to take it. Try to time your medicine so that you can take it before starting an activity. This might be before you get dressed, go for a walk, or sit down for dinner.    Common side effects of prescription pain medications  Pain medicines can cause nausea. Eat a little food before taking pain medicine to avoid this.  Constipation is a common side effect of pain medicines. Drinking lots of fluids and eating foods, such as fruits and vegetables, that are high in fiber can also help.   Call your doctor before taking any medicines such as laxatives or stool softeners to help ease constipation. Also, ask if you should avoid any foods. Remember, do not take laxatives unless your surgeon has prescribed them  Drinking alcohol and taking pain medicines can cause dizziness and slow your breathing. It can even be deadly. Do not drink alcohol while taking pain medicines.  Pain medicines can make you react more slowly to things. Do not drive or run machinery while taking pain medicines.    Important facts about Acetaminophen  (Tylenol )    Acetaminophen , also known as Tylenol  is a common, over the counter pain reliever.  Your health care provider may tell you to take  acetaminophen  to help ease your pain. Ask him or her how much you should take each day.     Recommended total dose of acetaminophen  (Tylenol ) is 3000mg  over 24 hours  Check your prescription pain medication labels to see if it contains acetaminophen .  Your prescription pain medication contains _______325_____ mg per tablet of acetaminophen .  Some over the counter (OTC) medicines also contain acetaminophen  (Tylenol ).    Be sure to check the labels for acetaminophen  in the ingredients.    Using both prescription pain medicines and over the counter (OTC) acetaminophen  for pain can cause you to overdose on acetaminophen  (Tylenol ).    More than 4000mg  of acetaminophen  in 24 hours may lead to liver failure.         Important facts about Ibuprofen  (Advil ; Motrin )  Ibuprofen  is a non-steroidal anti-inflammatory drug (NSAID) that works to decrease inflammation which  in turn provides pain relief.  Check with your surgeon if ibuprofen  is allowed after surgery.   Ask him or her how much you should take each day.   Maximum amount of Ibuprofen  (Advil ; Motrin ) is 3200mg  over 24 hours.    For the next 48 hours, it is recommended for patients to take over the counter (OTC) medications as the main source of pain management with opioids (prescription pain medication) as an added medication to be taken only if pain is very severe.    Effective way to manage pain is to alternate your medications every 4 hours between acetaminophen  and ibuprofen .      Last Tylenol  given at 9 am next dose will be at 5 pm (can be taken every 8 hrs)  Last Toradol / Ibuprofen  given at 11:30 am next dose will be at 7:30 pm (can be taken every 8 hrs)               Please check with your physician before using these medications if you have chronic liver or kidney diseases and if the ibuprofen  is allowed by your surgeon after surgery  If you have questions or do not understand the information, ask your pharmacist or health care provider to explain it to you  before you take your medicine.    Managing nausea  Some people have an upset stomach after surgery. This is often because of anesthesia, pain, pain medicines, or the stress of surgery. If you were on a special food plan before surgery, ask your doctor if you should follow it while you get better. The following are tips to help you manage nausea after anesthesia:  Do not push yourself to eat. Your body will tell you when to eat and how much.  Start off with clear liquids and soup. They are easier to digest.  Next try semi-solid foods such as mashed potatoes, applesauce, and gelatin, as you feel ready.  Slowly move to solid foods. Don't eat fatty, rich, or spicy foods at first.  Do not force yourself to have 3 large meals a day. Instead eat smaller amounts more often.  Take pain medicines with a small amount of food, such as crackers or toast, to avoid nausea.     Call your surgeon if:  You have worsening pain an hour after taking pain medicine. The pain medicine may not be strong enough.  You feel too sleepy, dizzy, or groggy. The pain medicine may be too strong.  You have side effects like persistent nausea, vomiting, or skin changes such as rash, itching, or hives.   You have bleeding through your dressing or your dressing becomes saturated.  You have signs of infection such as redness, fever, or increased/foul smelling drainage.      If you have obstructive sleep apnea (OSA)  You were given anesthesia medicine during surgery to keep you comfortable and free of pain. After surgery, you may have more apnea spells because of this medicine and other medicines you were given. These episodes may last longer than usual.   At home:   Keep using the continuous positive airway pressure (CPAP) device when you sleep. Unless your health care provider tells you not to, use it when you sleep, or take a nap; day or night. CPAP is a common device used to treat OSA.  Sleep/nap in upright position(ie. with pillows), or use a  recliner for first week and/or while on opioid medications or medications that are sedating  Another adult needs to  be with you at all times during the first 24 hours home.  Limit opioid use when possible and use alternative comfort measures.   Talk with your provider before taking any pain medicine, muscle relaxants, or sedatives. Your provider will tell you about the possible dangers of taking these medicines.

## 2023-05-09 ENCOUNTER — Other Ambulatory Visit: Payer: Self-pay

## 2023-05-10 ENCOUNTER — Ambulatory Visit: Admitting: Anesthesiology

## 2023-05-10 ENCOUNTER — Encounter: Payer: Self-pay | Admitting: Surgery

## 2023-05-10 ENCOUNTER — Other Ambulatory Visit: Payer: Self-pay

## 2023-05-10 ENCOUNTER — Ambulatory Visit
Admission: RE | Admit: 2023-05-10 | Discharge: 2023-05-10 | Disposition: A | Discharge status: Home / Self Care | Payer: No Typology Code available for payment source | Source: Ambulatory Visit | Attending: Surgery | Admitting: Surgery

## 2023-05-10 ENCOUNTER — Encounter
Admission: RE | Disposition: A | Discharge status: Home / Self Care | Payer: Self-pay | Source: Ambulatory Visit | Attending: Surgery

## 2023-05-10 DIAGNOSIS — R221 Localized swelling, mass and lump, neck: Secondary | ICD-10-CM

## 2023-05-10 DIAGNOSIS — I77810 Thoracic aortic ectasia: Secondary | ICD-10-CM | POA: Insufficient documentation

## 2023-05-10 DIAGNOSIS — E039 Hypothyroidism, unspecified: Secondary | ICD-10-CM | POA: Insufficient documentation

## 2023-05-10 DIAGNOSIS — Z79890 Hormone replacement therapy (postmenopausal): Secondary | ICD-10-CM | POA: Insufficient documentation

## 2023-05-10 DIAGNOSIS — D210 Benign neoplasm of connective and other soft tissue of head, face and neck: Secondary | ICD-10-CM | POA: Insufficient documentation

## 2023-05-10 DIAGNOSIS — Z79899 Other long term (current) drug therapy: Secondary | ICD-10-CM | POA: Insufficient documentation

## 2023-05-10 DIAGNOSIS — I10 Essential (primary) hypertension: Secondary | ICD-10-CM | POA: Insufficient documentation

## 2023-05-10 DIAGNOSIS — I351 Nonrheumatic aortic (valve) insufficiency: Secondary | ICD-10-CM | POA: Insufficient documentation

## 2023-05-10 HISTORY — PX: EXCISION, SOFT TISSUE: SHX4032

## 2023-05-10 LAB — POCT PREGNANCY TEST, URINE HCG: POCT Pregnancy HCG Test, UR: NEGATIVE

## 2023-05-10 SURGERY — EXCISION, SOFT TISSUE
Anesthesia: Anesthesia General | Site: Neck | Laterality: Right | Wound class: Clean

## 2023-05-10 MED ORDER — PROPOFOL 10 MG/ML IV EMUL (WRAP)
INTRAVENOUS | Status: AC
Start: 2023-05-10 — End: ?
  Filled 2023-05-10: qty 100

## 2023-05-10 MED ORDER — LIDOCAINE HCL (PF) 2 % IJ SOLN
INTRAMUSCULAR | Status: AC
Start: 2023-05-10 — End: ?
  Filled 2023-05-10: qty 5

## 2023-05-10 MED ORDER — HYDROMORPHONE HCL 1 MG/ML IJ SOLN
0.5000 mg | INTRAMUSCULAR | Status: DC | PRN
Start: 2023-05-10 — End: 2023-05-10

## 2023-05-10 MED ORDER — DEXAMETHASONE SODIUM PHOSPHATE 4 MG/ML IJ SOLN (WRAP)
INTRAMUSCULAR | Status: DC | PRN
Start: 2023-05-10 — End: 2023-05-10
  Administered 2023-05-10: 6 mg via INTRAVENOUS

## 2023-05-10 MED ORDER — ACETAMINOPHEN-CODEINE 300-30 MG PO TABS
1.0000 | ORAL_TABLET | ORAL | 0 refills | Status: DC | PRN
Start: 2023-05-10 — End: 2023-05-10

## 2023-05-10 MED ORDER — BUPIVACAINE HCL (PF) 0.5 % IJ SOLN
INTRAMUSCULAR | Status: AC
Start: 2023-05-10 — End: ?
  Filled 2023-05-10: qty 30

## 2023-05-10 MED ORDER — ROCURONIUM BROMIDE 10 MG/ML IV SOLN (WRAP)
INTRAVENOUS | Status: DC | PRN
Start: 2023-05-10 — End: 2023-05-10
  Administered 2023-05-10: 10 mg via INTRAVENOUS

## 2023-05-10 MED ORDER — SUCCINYLCHOLINE CHLORIDE 20 MG/ML IJ SOLN
INTRAMUSCULAR | Status: DC | PRN
Start: 2023-05-10 — End: 2023-05-10
  Administered 2023-05-10: 100 mg via INTRAVENOUS

## 2023-05-10 MED ORDER — PHENYLEPHRINE HCL 10 MG/ML IV SOLN (WRAP)
Status: DC | PRN
Start: 2023-05-10 — End: 2023-05-10
  Administered 2023-05-10 (×3): 100 ug via INTRAVENOUS
  Administered 2023-05-10: 50 ug via INTRAVENOUS

## 2023-05-10 MED ORDER — METOCLOPRAMIDE HCL 5 MG/ML IJ SOLN
10.0000 mg | Freq: Once | INTRAMUSCULAR | Status: DC | PRN
Start: 2023-05-10 — End: 2023-05-10

## 2023-05-10 MED ORDER — MIDAZOLAM HCL 1 MG/ML IJ SOLN (WRAP)
INTRAMUSCULAR | Status: DC | PRN
Start: 2023-05-10 — End: 2023-05-10
  Administered 2023-05-10: 2 mg via INTRAVENOUS

## 2023-05-10 MED ORDER — OXYCODONE HCL 5 MG PO TABS
5.0000 mg | ORAL_TABLET | Freq: Once | ORAL | Status: DC | PRN
Start: 2023-05-10 — End: 2023-05-10

## 2023-05-10 MED ORDER — FENTANYL CITRATE (PF) 50 MCG/ML IJ SOLN (WRAP)
INTRAMUSCULAR | Status: DC | PRN
Start: 2023-05-10 — End: 2023-05-10
  Administered 2023-05-10: 100 ug via INTRAVENOUS

## 2023-05-10 MED ORDER — GLYCOPYRROLATE 0.2 MG/ML IJ SOLN (WRAP)
INTRAMUSCULAR | Status: AC
Start: 2023-05-10 — End: ?
  Filled 2023-05-10: qty 1

## 2023-05-10 MED ORDER — ACETAMINOPHEN 325 MG PO TABS
1000.0000 mg | ORAL_TABLET | Freq: Four times a day (QID) | ORAL | Status: AC
Start: 2023-05-10 — End: 2023-05-12

## 2023-05-10 MED ORDER — KETOROLAC TROMETHAMINE 30 MG/ML IJ SOLN
INTRAMUSCULAR | Status: DC | PRN
Start: 2023-05-10 — End: 2023-05-10
  Administered 2023-05-10: 30 mg via INTRAVENOUS

## 2023-05-10 MED ORDER — PROPOFOL INFUSION 10 MG/ML
INTRAVENOUS | Status: DC | PRN
Start: 2023-05-10 — End: 2023-05-10
  Administered 2023-05-10: 50 ug/kg/min via INTRAVENOUS

## 2023-05-10 MED ORDER — ACETAMINOPHEN 500 MG PO TABS
1000.0000 mg | ORAL_TABLET | Freq: Once | ORAL | Status: DC
Start: 2023-05-10 — End: 2023-05-10

## 2023-05-10 MED ORDER — ACETAMINOPHEN-CODEINE 300-30 MG PO TABS
1.0000 | ORAL_TABLET | ORAL | 0 refills | Status: AC | PRN
Start: 2023-05-10 — End: 2023-05-17
  Filled 2023-05-10: qty 5, 1d supply, fill #0

## 2023-05-10 MED ORDER — MIDAZOLAM HCL 1 MG/ML IJ SOLN (WRAP)
INTRAMUSCULAR | Status: AC
Start: 2023-05-10 — End: ?
  Filled 2023-05-10: qty 2

## 2023-05-10 MED ORDER — ONDANSETRON HCL 4 MG/2ML IJ SOLN
INTRAMUSCULAR | Status: AC
Start: 2023-05-10 — End: ?
  Filled 2023-05-10: qty 2

## 2023-05-10 MED ORDER — KETOROLAC TROMETHAMINE 60 MG/2ML IM SOLN
INTRAMUSCULAR | Status: AC
Start: 2023-05-10 — End: ?
  Filled 2023-05-10: qty 2

## 2023-05-10 MED ORDER — PROPOFOL 10 MG/ML IV EMUL (WRAP)
INTRAVENOUS | Status: AC
Start: 2023-05-10 — End: ?
  Filled 2023-05-10: qty 20

## 2023-05-10 MED ORDER — FENTANYL CITRATE (PF) 50 MCG/ML IJ SOLN (WRAP)
INTRAMUSCULAR | Status: AC
Start: 2023-05-10 — End: ?
  Filled 2023-05-10: qty 2

## 2023-05-10 MED ORDER — LIDOCAINE-EPINEPHRINE 1 %-1:100000 IJ SOLN
INTRAMUSCULAR | Status: AC
Start: 2023-05-10 — End: ?
  Filled 2023-05-10: qty 20

## 2023-05-10 MED ORDER — ACETAMINOPHEN 500 MG PO TABS
1000.0000 mg | ORAL_TABLET | Freq: Once | ORAL | Status: AC
Start: 2023-05-10 — End: 2023-05-10

## 2023-05-10 MED ORDER — IBUPROFEN 200 MG PO TABS
600.0000 mg | ORAL_TABLET | Freq: Four times a day (QID) | ORAL | Status: AC
Start: 2023-05-10 — End: 2023-05-12

## 2023-05-10 MED ORDER — PROPOFOL 10 MG/ML IV EMUL (WRAP)
INTRAVENOUS | Status: DC | PRN
Start: 2023-05-10 — End: 2023-05-10
  Administered 2023-05-10: 200 mg via INTRAVENOUS

## 2023-05-10 MED ORDER — ONDANSETRON HCL 4 MG/2ML IJ SOLN
INTRAMUSCULAR | Status: DC | PRN
Start: 2023-05-10 — End: 2023-05-10
  Administered 2023-05-10: 4 mg via INTRAVENOUS

## 2023-05-10 MED ORDER — SODIUM CHLORIDE 0.9 % IR SOLN
Status: DC | PRN
Start: 2023-05-10 — End: 2023-05-10
  Administered 2023-05-10: 1000 mL

## 2023-05-10 MED ORDER — LIDOCAINE HCL 2 % IJ SOLN
INTRAMUSCULAR | Status: DC | PRN
Start: 2023-05-10 — End: 2023-05-10
  Administered 2023-05-10: 30 mg

## 2023-05-10 MED ORDER — FENTANYL CITRATE (PF) 50 MCG/ML IJ SOLN (WRAP)
25.0000 ug | INTRAMUSCULAR | Status: DC | PRN
Start: 2023-05-10 — End: 2023-05-10

## 2023-05-10 MED ORDER — GLYCOPYRROLATE 0.2 MG/ML IJ SOLN (WRAP)
INTRAMUSCULAR | Status: DC | PRN
Start: 2023-05-10 — End: 2023-05-10
  Administered 2023-05-10: .2 mg via INTRAVENOUS

## 2023-05-10 MED ORDER — LIDOCAINE-EPINEPHRINE 1 %-1:100000 IJ SOLN
INTRAMUSCULAR | Status: DC | PRN
Start: 2023-05-10 — End: 2023-05-10
  Administered 2023-05-10: 10 mL

## 2023-05-10 MED ORDER — LIDOCAINE 4 % EX CREAM (WRAP)
TOPICAL_CREAM | Freq: Once | CUTANEOUS | Status: DC | PRN
Start: 2023-05-10 — End: 2023-05-10

## 2023-05-10 MED ORDER — ROCURONIUM BROMIDE 50 MG/5ML IV SOLN
INTRAVENOUS | Status: AC
Start: 2023-05-10 — End: ?
  Filled 2023-05-10: qty 5

## 2023-05-10 MED ORDER — LIDOCAINE HCL 1 % IJ SOLN
1.0000 mL | Freq: Once | INTRAMUSCULAR | Status: DC | PRN
Start: 2023-05-10 — End: 2023-05-10

## 2023-05-10 MED ORDER — ONDANSETRON HCL 4 MG/2ML IJ SOLN
4.0000 mg | Freq: Once | INTRAMUSCULAR | Status: DC | PRN
Start: 2023-05-10 — End: 2023-05-10

## 2023-05-10 MED ORDER — BUPIVACAINE HCL 0.5 % IJ SOLN
INTRAMUSCULAR | Status: DC | PRN
Start: 2023-05-10 — End: 2023-05-10
  Administered 2023-05-10: 10 mL

## 2023-05-10 MED ORDER — LACTATED RINGERS IV SOLN
INTRAVENOUS | Status: DC
Start: 2023-05-10 — End: 2023-05-10
  Filled 2023-05-10: qty 1000

## 2023-05-10 MED ORDER — ACETAMINOPHEN 500 MG PO TABS
ORAL_TABLET | ORAL | Status: AC
Start: 2023-05-10 — End: 2023-05-10
  Administered 2023-05-10: 1000 mg via ORAL
  Filled 2023-05-10: qty 2

## 2023-05-10 SURGICAL SUPPLY — 38 items
ADHESIVE LIQUID WATERPROOF VIAL PREP NONSTAIN MASTISOL STYRAX GUM (Skin Closure) ×2 IMPLANT
ADHESIVE LQ STYRAX GUM MASTIC ALC MTHY (Skin Closure) ×2 IMPLANT
ADHESIVE SKIN CLOSURE DERMABOND ADVANCED (Skin Closure) ×4 IMPLANT
ADHESIVE SKIN CLOSURE DERMABOND ADVANCED .7 ML LIQUID APPLICATOR (Skin Closure) ×3 IMPLANT
APPLICATOR CHLORAPREP 26 ML 70% ISOPROPYL ALCOHOL 2% CHLORHEXIDINE (Applicator) ×2 IMPLANT
APPLICATOR PRP 70% ISPRP 2% CHG 26ML (Applicator) ×2 IMPLANT
BLADE SURGICAL CLIPPER WIDE W37.2 MM (Blade) ×2 IMPLANT
BLADE SURGICAL CLIPPER WIDE W37.2 MM GENERAL PURPOSE EXIST HANDLE DROP (Blade) ×2 IMPLANT
CONTAINER PATH PP 8OZ LF NS LEK RST LID (Procedure Accessories) ×2 IMPLANT
CONTAINER PATHOLOGY POLYPROPYLENE LK ESSTNT LID FRZBL 8OZ NONSTRL LF (Procedure Accessories) ×2 IMPLANT
COVER FLEXIBLE LIGHT HANDLE PLASTIC GREEN (Procedure Accessories) ×4 IMPLANT
COVER FLEXIBLE MEDLINE LIGHT HANDLE (Procedure Accessories) ×4 IMPLANT
DRESSING TRANSPARENT L2 3/4 IN X W2 3/8 (Dressing) ×2 IMPLANT
DRESSING TRANSPARENT L2 3/4 IN X W2 3/8 IN POLYURETHANE ADHESIVE (Dressing) ×2 IMPLANT
ELECTRODE ADULT PATIENT RETURN L9 FT REM POLYHESIVE ACRYLIC FOAM (Procedure Accessories) ×2 IMPLANT
ELECTRODE PATIENT RETURN L9 FT VALLEYLAB (Procedure Accessories) ×2 IMPLANT
GLOVE SURGICAL 6 1/2 BIOGEL PI INDICATOR (Glove) ×2 IMPLANT
GLOVE SURGICAL 6 1/2 BIOGEL PI INDICATOR UNDERGLOVE POWDER FREE SMOOTH (Glove) ×2 IMPLANT
GLOVE SURGICAL 6 BIOGEL PI MICRO POWDER (Glove) ×2 IMPLANT
GLOVE SURGICAL 6 BIOGEL PI MICRO POWDER FREE BEAD CUFF MICRO ROUGHEN (Glove) ×2 IMPLANT
GOWN SRG LG SMARTGOWN LF STRL LVL 4 (Gown) ×2 IMPLANT
GOWN SRGCL LG LEVEL 4 BRTHBL STRL LF DSPSBL SMARTGOWN (Gown) ×2 IMPLANT
PACK SURGICAL GENERAL MAJOR SHARE (Procedure Accessories) ×2 IMPLANT
PACK SURGICAL GENERAL MAJOR SHARE MEDLINE INDUSTRIES,INC. (Procedure Accessories) ×2 IMPLANT
PENCIL SMOKE EVACUATOR COATED PUSH (Cautery) ×2 IMPLANT
PENCIL SMOKE EVACUATOR COATED PUSH BUTTON NEPTUNE E-SEP (Cautery) ×2 IMPLANT
SLEEVE CMPR NYL XL KN LGTH KDL SCD EXP (Sleeve) ×2 IMPLANT
SLEEVE COMPRESSION NYLON XL KNEE LENGTH KENDALL ADJUSTABLE (Sleeve) ×2 IMPLANT
STRIP SKIN CLOSURE L4 IN X W1/2 IN (Dressing) ×2 IMPLANT
STRIP SKIN CLOSURE L4 IN X W1/2 IN REINFORCE STERI-STRIP POLYESTER (Dressing) ×2 IMPLANT
SUTURE ABS 4-0 P-3 COAT VCL 18IN BRD UD (Suture) IMPLANT
SUTURE COATED VICRYL 4-0 P-3 18IN BRAID UNDYED ABSORBABLE (Suture) IMPLANT
SUTURE COATED VICRYL PLUS 3-0 SH L27 IN (Suture) IMPLANT
SUTURE COATED VICRYL PLUS 3-0 SH L27 IN BRAID ANTIBACTERIAL COATED (Suture) IMPLANT
SUTURE COATED VICRYL PLUS 4-0 PS-2 L27 (Suture) IMPLANT
SUTURE COATED VICRYL PLUS 4-0 PS-2 L27 IN BRAID ANTIBACTERIAL COATED (Suture) IMPLANT
TAPE SURGICAL L10 YD X W3 IN (Tape) ×2 IMPLANT
TAPE SURGICAL L10 YD X W3 IN HYPOALLERGENIC WATER RESISTANT (Tape) ×2 IMPLANT

## 2023-05-10 NOTE — Anesthesia Preprocedure Evaluation (Addendum)
 Anesthesia Evaluation    AIRWAY    Mallampati: III    TM distance: >3 FB  Neck ROM: full  Mouth Opening:full  Planned to use difficult airway equipment: No CARDIOVASCULAR    cardiovascular exam normal       DENTAL    no notable dental hx               PULMONARY    pulmonary exam normal     OTHER FINDINGS      43 y/o woman PMH significant for hypothyroid, HTN p/f posterior neck mass excision    Labs 06/08/22  Hct 37  Plt 226  Cr 0.8  K 4.2    TTE 06/18/2022 - wnl                                    Relevant Problems   CARDIO   (+) Aortic ectasia, thoracic   (+) Nonrheumatic aortic valve insufficiency   (+) Primary hypertension      ENDO   (+) Acquired hypothyroidism               Anesthesia Plan    ASA 2     general                     Detailed anesthesia plan: general endotracheal            informed consent obtained                     Signed by: Janean LITTIE Speed, MD 05/10/23 8:05 AM

## 2023-05-10 NOTE — Anesthesia Postprocedure Evaluation (Signed)
 Anesthesia Post Evaluation    Patient: Consepcion Arantes Ervin Everitt Crete    EXCISION OF POSTERIOR NECK MASS,RIGHT (Right: Neck)    Anesthesia type: general    Last Vitals:   Vitals Value Taken Time   BP 143/95 05/10/23 12:30   Temp 36.6 C (97.9 F) 05/10/23 12:30   Pulse 83 05/10/23 12:30   Resp 18 05/10/23 12:30   SpO2 96 % 05/10/23 12:30                 Anesthesia Post Evaluation:     Patient Evaluated: PACU    Level of Consciousness: awake and alert    Pain Management: adequate  Multimodal analgesia pain management approach  Strategies: acetaminophen  and local anesthesia  Airway Patency: patent  Two or more mitigation strategies used for obstructive sleep apnea.  Strategies: awake extubation and multimodal analgesia    Anesthetic complications: No      PONV Status: none    Cardiovascular status: acceptable  Respiratory status: acceptable  Hydration status: acceptable          Signed by: Janean LITTIE Speed, MD, 05/10/2023 1:32 PM

## 2023-05-10 NOTE — Brief Op Note (Signed)
 BRIEF OP NOTE    Date Time: 05/10/23 12:07 PM    Patient Name:   Peggy Kennedy    Date of Operation:   05/10/2023    Providers Performing:   Surgeons and Role:     * Gleen Reena CROME, MD - Primary     * Zwemer, Dorothyann DEL, MD - Resident - Assisting    Operative Procedure:   Procedure(s):  EXCISION OF POSTERIOR NECK MASS,RIGHT    Preoperative Diagnosis:   Pre-Op Diagnosis Codes:      * Neck mass [R22.1]    Postoperative Diagnosis:   Neck mass     Anesthesia:   General    Fluids (I/O):   EBL:  5 mL  UOP: none  Crystalloid: see anesthesia record  Colloid: none  Blood Products: none    Implants:   * No implants in log *    Drains:   None    Specimens:   Right posterior neck mass     Findings:   1.6 cm posterior neck mass     Wound Class:   Clean    Complications:   None    Signed by: Catherine H Zwemer, MD  [facility] MA[facility] OR

## 2023-05-10 NOTE — H&P (Signed)
**Note Kennedy-Identified via Obfuscation**  HISTORY AND PHYSICAL EXAM  Surgery  Team 6: General Surgery, [facility]  [id number]    Date Time: 05/10/23 8:56 AM  Patient Name: Peggy Kennedy  Attending Physician: Gleen Mems, MD  Chief Complaint: Posterior neck mass     History of Present Illness:   Peggy Kennedy is a 43 y.o. female who presents to the hospital with PMHx of Hashimoto thyroiditis and a history of posterior neck mass for two months. Ultrasound of the mass 03/24/23 showing structure approx 0.5cm deep to the skin with solid and cystic structure measuring 0.5 x 0.4 x 0.3cm. Palpable only when neck is flexed and muscle is lax.  She was seen by Dr. Gleen in clinic 04/02/23.    Since seeing Dr. Gleen, she had mammography screening in the last two weeks with 3 biopsies all benign. She will be following up with a breast surgery.     Denies new medications or hospitalizations. Denies use of blood thinners.     Pt wishes to proceed with procedure as planned. All questions asked and answered to patient satisfaction.    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 Authorizing Provider   drospirenone -ethinyl estradiol  (Syeda ) 3-0.03 MG per tablet Take 1 tablet by mouth daily 03/24/23   Lender, Eva RAMAN, MD   levothyroxine  (SYNTHROID ) 112 MCG tablet Take 1 tablet (112 mcg) by mouth daily 02/24/23   Lender, Eva RAMAN, MD   lisinopril  (ZESTRIL ) 10 MG tablet TAKE 1 TABLET (10 MG) BY MOUTH DAILY. 02/12/23   Lender, Eva RAMAN, MD       Review of Systems:    Comprehensive ROS complete.  Negative except as above.    Physical Exam:     Vitals:    05/10/23 0832   BP: 120/83   Pulse: 75   Resp: 15   Temp: 98.2 F (36.8 C)   SpO2: 99%       Body mass index is 21.93 kg/m.    Intake and Output Summary (Last 24 hours) at Date Time  No intake or output data in the 24 hours ending 05/10/23 0856    General:  Laying in bed in NAD, A&Ox3, Well appearing  CV: RRR  Pulm: Non-labored breathing, CTAB, Equal chest rise  GI: Abdomen soft, non-tender, non-distended, no rebound or guarding  Extremities: No obvious deformities  Neuro: Moves all extremities spontaneously   Skin: Warm, well perfused  Neck: palpable mass on the right side of posterior neck palpable only when flexed      Problem List:   Problem List[5]    Assessment:   43 y.o. female with 0.5cm deep cystic structure of the posterior neck.     Plan:   - Plan to OR for procedure as planned  - Patient has been evaluated and is deemed safe to proceed forward  with the procedure discussed.   - Patient has been given ample opportunity to ask appropriate questions and those questions have been answered to their satisfaction.   - The consent form has been signed, witnessed and placed into the chart.    Signed by: Loman Logan H France Lusty, MD         [1]   Past Surgical History:  Procedure Laterality Date    CESAREAN SECTION  02/2017    LAPAROSCOPIC, CHOLECYSTECTOMY  08/24/2022    Dr. Wilhelminia at [facility]   [2]   Family History  Problem Relation Name Age of Onset    Hypertension Mother Father     Cholelithiasis Mother Father     Hypertension Father Richie     Cancer Father Richie         stomach cancer    Cholelithiasis Father Richie     Stroke Father Richie     Breast cancer Paternal Aunt     [3]   Social History  Socioeconomic History    Marital status: Married   Tobacco Use    Smoking status: Never     Passive exposure: Never    Smokeless tobacco: Never   Vaping Use    Vaping status: Never Used   Substance and Sexual Activity     Alcohol use: Not Currently     Comment: occassionally    Drug use: Never    Sexual activity: Yes     Partners: Male     Birth control/protection: Pill     Comment: Yas     Social Drivers of Psychologist, Prison And Probation Services Strain: Low Risk  (05/10/2023)    Overall Financial Resource Strain (CARDIA)     Difficulty of Paying Living Expenses: Not very hard   Food Insecurity: No Food Insecurity (05/10/2023)    Hunger Vital Sign     Worried About Running Out of Food in the Last Year: Never true     Ran Out of Food in the Last Year: Never true   Transportation Needs: No Transportation Needs (05/10/2023)    PRAPARE - Therapist, Art (Medical): No     Lack of Transportation (Non-Medical): No   Physical Activity: Insufficiently Active (05/10/2023)    Exercise Vital Sign     Days of Exercise per Week: 3 days     Minutes of Exercise per Session: 30 min   Stress: No Stress Concern Present (05/10/2023)    [employer] of Occupational Health - Occupational Stress Questionnaire     Feeling of Stress : Only a little   Social Connections: Socially Integrated (05/10/2023)    Social Connection and Isolation Panel [NHANES]     Frequency of Communication with Friends and Family: More than three times a week     Frequency of Social Gatherings with Friends and Family: More than three times a week     Attends Religious Services: More than 4 times per year     Active Member of [employer] or Organizations: Yes     Attends Banker Meetings: More than 4 times per year     Marital Status: Married   Catering Manager Violence: Not At Risk (05/10/2023)    Humiliation, Afraid, Rape, and Kick questionnaire     Fear of Current or Ex-Partner: No     Emotionally Abused: No     Physically Abused: No     Sexually Abused: No   Housing Stability: Low Risk  (05/10/2023)  Housing Stability Vital Sign     Unable to Pay for Housing in the Last Year: No     Number of Times Moved in the Last Year: 0     Homeless in the Last Year: No    [4] No Known Allergies  [5]   Patient Active Problem List  Diagnosis    Acquired hypothyroidism    DDD (degenerative disc disease), lumbar    PCOS (polycystic ovarian syndrome)    Primary hypertension    Family history of coronary artery disease in father    Nonrheumatic aortic valve insufficiency    Aortic ectasia, thoracic    Mass of right side of neck    Subcutaneous mass of neck

## 2023-05-10 NOTE — Op Note (Signed)
 FULL OPERATIVE NOTE    Date Time: 05/10/23 12:53 PM  Patient Name: Peggy Kennedy, Peggy Kennedy (MRN: [id number])  Attending Physician: Gleen Reena CROME, MD      Date of Operation:   05/10/2023    Providers Performing:   Surgeons and Role:     * Gleen Reena CROME, MD - Primary     * Zwemer, Dorothyann DEL, MD - Resident - Assisting    Surgical First Assistant(s):   None    Operative Procedure:   EXCISION OF POSTERIOR NECK MASS,RIGHT: [phone] (CPT)      Preoperative Diagnosis:   Pre-Op Diagnosis Codes:      * Neck mass [R22.1]    Postoperative Diagnosis:   Post-Op Diagnosis Codes:     * Neck mass [R22.1]    Anesthesia:   General    Findings:   Neck mass     Indications:   Neck mass    Operative Notes:   The patient was identified and the mass marked in the preoperative holding area and transported to the OR. She was moved to the operating table in supine position with arms abducted, pressure points padded, and SCDs applied. General anesthesia was induced without incident. The patient was then positioned in the lateral decubitus postion with bean bags and padding where appropriate. The nape of the neck was clipped of hair and ultrasound jelly was used to slick the small hairs out of the surgical field. The back of the neck was then prepped with ChloraPrep and draped to expose only the back of the neck and the surgical area. A presurgical pause was conducted with all in attendance and the correct patient, procedure, and laterality were confirmed.     A total of 10cc of local anesthetic was injected surrounding the skin marking. A 3cm incision was made over the site of the mass. The dermis was opened and the subcutaneous, soft tissue layer entered and divided with the electrosurgery pencil. The mass was exposed along the underlying muscular fascia, and the mass was sharply separated from the fascia and off the muscle lying posterior. The appearance of the mass was white and fibrous-appearing. The mass was excised and  measured 1.6cm x 1.0 cm by 0.6 cm. It was placed in a container to be sent for pathology.    The wound bed was then irrigated. Hemostasis was achieved.  A layered closure was then performed. The muscle fascia was reapproximated with 3-0 Vicryl. Deep dermal 3-0 Vicryl interrupted buried sutures were placed to approximate the skin edges, followed by a 4-0 Vicryl subcuticular running suture.   Surgical glue was applied to the incision. Mallissa tolerated the procedure well.    Estimated Blood Loss:   5 mL    Implants:   * No implants in log *       Drains:   Drains: no      Specimens:     ID Type Source Tests Collected by Time Destination   1 : RIGHT POSTERIOR NECK MASS Tissue Neck (Specify if applicable) SURGICAL PATHOLOGY Gleen Reena CROME, MD 05/10/2023 1102          Complications:   None    Signed by: Catherine H Zwemer, MD, MD  [facility]      Catherine Zwemer, MD  General Surgery, PGY-1  [facility]    I was present and scrubbed for the entire procedure and I agree with the resident's documentation.  Reena Gleen, MD FACS

## 2023-05-11 ENCOUNTER — Encounter: Payer: Self-pay | Admitting: Surgery

## 2023-05-14 LAB — SURGICAL PATHOLOGY

## 2023-05-16 ENCOUNTER — Other Ambulatory Visit (INDEPENDENT_AMBULATORY_CARE_PROVIDER_SITE_OTHER): Payer: Self-pay | Admitting: Internal Medicine

## 2023-05-16 DIAGNOSIS — Z8249 Family history of ischemic heart disease and other diseases of the circulatory system: Secondary | ICD-10-CM

## 2023-05-16 DIAGNOSIS — I10 Essential (primary) hypertension: Secondary | ICD-10-CM

## 2023-05-17 NOTE — Telephone Encounter (Signed)
 Last filled January 2025.   Last o/v March 2025.  Patient does not have an upcoming appointment  Queued up 90 with 0 refills.

## 2023-05-18 NOTE — Telephone Encounter (Signed)
 Refill sent.

## 2023-05-19 ENCOUNTER — Encounter (INDEPENDENT_AMBULATORY_CARE_PROVIDER_SITE_OTHER): Payer: Self-pay | Admitting: Internal Medicine

## 2023-05-19 ENCOUNTER — Ambulatory Visit (INDEPENDENT_AMBULATORY_CARE_PROVIDER_SITE_OTHER): Payer: No Typology Code available for payment source | Admitting: Internal Medicine

## 2023-05-19 VITALS — BP 123/83 | HR 86 | Ht 67.0 in | Wt 134.5 lb

## 2023-05-19 DIAGNOSIS — E282 Polycystic ovarian syndrome: Secondary | ICD-10-CM

## 2023-05-19 DIAGNOSIS — R921 Mammographic calcification found on diagnostic imaging of breast: Secondary | ICD-10-CM

## 2023-05-19 DIAGNOSIS — E559 Vitamin D deficiency, unspecified: Secondary | ICD-10-CM

## 2023-05-19 DIAGNOSIS — R928 Other abnormal and inconclusive findings on diagnostic imaging of breast: Secondary | ICD-10-CM

## 2023-05-19 DIAGNOSIS — Z0000 Encounter for general adult medical examination without abnormal findings: Secondary | ICD-10-CM

## 2023-05-19 DIAGNOSIS — M51369 Other intervertebral disc degeneration, lumbar region without mention of lumbar back pain or lower extremity pain: Secondary | ICD-10-CM

## 2023-05-19 DIAGNOSIS — E039 Hypothyroidism, unspecified: Secondary | ICD-10-CM

## 2023-05-19 DIAGNOSIS — N6325 Unspecified lump in the left breast, overlapping quadrants: Secondary | ICD-10-CM

## 2023-05-19 DIAGNOSIS — Z8249 Family history of ischemic heart disease and other diseases of the circulatory system: Secondary | ICD-10-CM

## 2023-05-19 DIAGNOSIS — I10 Essential (primary) hypertension: Secondary | ICD-10-CM

## 2023-05-19 MED ORDER — LEVOTHYROXINE SODIUM 112 MCG PO TABS
112.0000 ug | ORAL_TABLET | Freq: Every day | ORAL | 3 refills | Status: AC
Start: 2023-05-19 — End: ?

## 2023-05-19 MED ORDER — DROSPIRENONE-ETHINYL ESTRADIOL 3-0.03 MG PO TABS
1.0000 | ORAL_TABLET | Freq: Every day | ORAL | 3 refills | Status: AC
Start: 2023-05-19 — End: ?

## 2023-05-19 MED ORDER — LISINOPRIL 10 MG PO TABS
10.0000 mg | ORAL_TABLET | Freq: Every day | ORAL | 3 refills | Status: AC
Start: 2023-05-19 — End: ?

## 2023-05-19 MED ORDER — ERGOCALCIFEROL 1.25 MG (50000 UT) PO CAPS
50000.0000 [IU] | ORAL_CAPSULE | ORAL | 1 refills | Status: DC
Start: 2023-05-19 — End: 2023-11-15

## 2023-05-19 NOTE — Progress Notes (Signed)
 Have you seen any specialists/other providers since your last visit with us ?    Yes GYN      The patient was informed that the following HM items are still outstanding:   Health Maintenance Due   Topic Date Due    COVID-19 Vaccine (3 - 2024-25 season) 10/11/2022

## 2023-05-19 NOTE — Progress Notes (Signed)
 Subjective:      Patient ID: Peggy Kennedy is a 43 y.o. female    Chief Complaint   Patient presents with    Annual Exam     Patient is not fasting, saw GYN on 3/10 had labs done         Pt presents for physical.   History of Present Illness  Peggy Kennedy is a 43 year old female who presents for an annual physical exam.     She has a history of low vitamin D  levels, confirmed during a recent visit to her gynecologist. She does not consume whole milk and has darker skin, which may contribute to her low vitamin D  levels. She is not currently on a vitamin D  supplement.    A mass on her neck was excised and found to be non-worrisome by pathology.    She has an upcoming breast appointment and is scheduled to undergo an MRI due to incongruencies between imaging and biopsy results.    She is currently on birth control, which has improved her skin and hair condition. She experiences mild headaches during her menstrual period but is otherwise satisfied with the current birth control regimen.    She has a history of gallbladder surgery, which left a scar that she feels is darker than expected. She wears one-piece bathing suits and is considering using silicone strips to improve the appearance of the scar.    The following portions of the patient's history were reviewed and updated as appropriate: current medications, allergies, past family history, past medical history, past social history, past surgical history and problem list.     Review of Systems   Constitutional:  Negative for chills, fatigue, fever and unexpected weight change.   HENT:  Negative for congestion, ear pain, hearing loss, mouth sores, rhinorrhea, sinus pressure and sore throat.    Eyes:  Negative for itching and visual disturbance.   Respiratory:  Negative for cough and shortness of breath.    Cardiovascular:  Negative for chest pain.   Gastrointestinal:  Negative for abdominal pain, blood in stool,  constipation, diarrhea, nausea and vomiting.   Genitourinary:  Negative for difficulty urinating, dysuria, hematuria, menstrual problem, vaginal bleeding, vaginal discharge and vaginal pain.   Musculoskeletal:  Negative for arthralgias, joint swelling and myalgias.   Skin:  Negative for rash.   Neurological:  Negative for dizziness, weakness, light-headedness and headaches.   Hematological:  Negative for adenopathy. Does not bruise/bleed easily.   Psychiatric/Behavioral:  The patient is not nervous/anxious.    All other systems reviewed and are negative.       Problem List[1]  Medical History[2]  Past Surgical History[3]  Family History[4]  Social History[5]  Current Medications[6]  Allergies[7]     BP 123/83 (BP Site: Left arm, Patient Position: Sitting, Cuff Size: Medium)   Pulse 86   Ht 1.702 m (5\' 7" )   Wt 61 kg (134 lb 8 oz)   LMP 05/19/2023   SpO2 98%   BMI 21.07 kg/m    Objective:   Physical Exam  Vitals reviewed.   Constitutional:       Appearance: She is well-developed.   HENT:      Head: Normocephalic and atraumatic.      Right Ear: Hearing, tympanic membrane and external ear normal.      Left Ear: Hearing, tympanic membrane and external ear normal.      Nose: Nose normal.  Mouth/Throat:      Pharynx: No oropharyngeal exudate.   Eyes:      Conjunctiva/sclera: Conjunctivae normal.      Pupils: Pupils are equal, round, and reactive to light.   Neck:      Thyroid : No thyromegaly.     Cardiovascular:      Rate and Rhythm: Normal rate and regular rhythm.      Heart sounds: Normal heart sounds. No murmur heard.  Pulmonary:      Effort: Pulmonary effort is normal. No respiratory distress.      Breath sounds: Normal breath sounds.   Abdominal:      General: Bowel sounds are normal. There is no distension.      Palpations: Abdomen is soft. There is no hepatomegaly, splenomegaly or mass.      Tenderness: There is no abdominal tenderness. There is no guarding or rebound.   Musculoskeletal:         General:  No tenderness. Normal range of motion.      Cervical back: Normal range of motion and neck supple.   Lymphadenopathy:      Cervical: No cervical adenopathy.      Upper Body:      Right upper body: No supraclavicular adenopathy.      Left upper body: No supraclavicular adenopathy.   Skin:     General: Skin is warm and dry.      Findings: No rash.   Neurological:      Mental Status: She is alert and oriented to person, place, and time.         Assessment:     1. Routine history and physical examination of adult        2. Acquired hypothyroidism  Thyroid  Stimulating Hormone (TSH) with Reflex to Free T4    levothyroxine  (SYNTHROID ) 112 MCG tablet      3. Degeneration of intervertebral disc of lumbar region, unspecified whether pain present        4. PCOS (polycystic ovarian syndrome)  drospirenone -ethinyl estradiol  (Syeda ) 3-0.03 MG per tablet      5. Primary hypertension  lisinopril  (ZESTRIL ) 10 MG tablet      6. Breast calcifications        7. Mass overlapping multiple quadrants of left breast        8. Abnormal mammogram        9. Vitamin D  deficiency  Vitamin D , 25 OH, Total    ergocalciferol  (ERGOCALCIFEROL ) 1.25 MG (50000 UT) capsule      10. Family history of heart disease  lisinopril  (ZESTRIL ) 10 MG tablet            Plan:     Assessment & Plan  Recording duration: 19 minutes     43 y.o. female presents for physical and fasting blood work    1.Discussed the patient's BMI with her.  Body mass index is 21.07 kg/m.     2. Counseling: Counseling/Anticipatory Guidance (40-64): Nutrition, physical activity, healthy weight, injury prevention, misuse of tobacco, alcohol and drugs, sexual behavior and STDs, contraception, dental health, mental health, immunizations, screenings.     3.  HQI:ONGE controlled   -continue lisinopril  10mg  daily  -continue home bp log and bring to next visit  -confirmed home bp monitor at last visit, seems to be a bit higher with home machine  -continue low salt, healthy diet and maintain  healthy weight  -had cardio work up 2024  -counseled on signs/symptoms to warrant re-evaluation  4. Vitamin D  deficiency  Persistent deficiency likely due to limited sun exposure and dietary intake, compounded by darker skin pigment. Goal: levels above 30 ng/mL.  - Prescribe vitamin D  50,000 units weekly for 12 weeks.  - Advise daily supplementation of 1,000 to 2,000 units of vitamin D  indefinitely post-treatment.  - Repeat vitamin D  levels in 3 months.    5. Neck mass  Small neck mass with benign pathology findings, likely non-concerning.  -recommend sun protection  -follow up with gen surg as scheduled    6. Breast mass with calcifications  Left breast mass with calcifications; initial benign biopsy results incongruent with imaging. MRI planned for further evaluation. Consultation with Dr. August Leavens scheduled to discuss potential risks and management, including possible lumpectomy based on MRI findings.  - Perform breast MRI on May 24, 2023.  - Consult with Dr. August Leavens post-MRI for further evaluation and management.    7. Hypothyroidism  Thyroid  function improved with current Synthroid  dosage; TSH levels stable.  - Continue current Synthroid  dosage.  - Schedule repeat thyroid  function tests in 3 months.    Health maintenance:  -Fasting blood work: future labs ordered  -Immunizations: recommend COVID   -STD screen: declined   -Pap: up to date, sees GYN  -Mammogram: UTD  -DEXA: due at 12  -Colonoscopy: due at 42  -Derm: up to date, sees Dr. Dossie Gayer  -Vision: UTD  -Dental: UTD  -Advanced directive: discuss at future visit     Follow up 1 year for next physical or sooner if needed.    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 Alvaro Jim at [facility]   [4]   Family History  Problem Relation Name Age of Onset    Hypertension Mother Father     Cholelithiasis Mother Father     Hypertension Father Millard Alliance     Cancer Father Millard Alliance         stomach cancer    Cholelithiasis Father Millard Alliance     Stroke Father Millard Alliance     Breast cancer Paternal Aunt     [5]   Social History  Socioeconomic History    Marital status: Married   Tobacco Use    Smoking status: Never     Passive exposure: Never    Smokeless tobacco: Never   Vaping Use  Vaping status: Never Used   Substance and Sexual Activity    Alcohol use: Not Currently     Comment: occassionally    Drug use: Never    Sexual activity: Yes     Partners: Male     Birth control/protection: Pill     Comment: Yas     Social Drivers of Psychologist, prison and probation services Strain: Low Risk  (05/10/2023)    Overall Financial Resource Strain (CARDIA)     Difficulty of Paying Living Expenses: Not very hard   Food Insecurity: No Food Insecurity (05/10/2023)    Hunger Vital Sign     Worried About Running Out of Food in the Last Year: Never true     Ran Out of Food in the Last Year: Never true   Transportation Needs: No Transportation Needs (05/10/2023)    PRAPARE - Therapist, art (Medical): No     Lack of Transportation (Non-Medical): No   Physical Activity: Insufficiently  Active (05/10/2023)    Exercise Vital Sign     Days of Exercise per Week: 3 days     Minutes of Exercise per Session: 30 min   Stress: No Stress Concern Present (05/10/2023)    [employer] of Occupational Health - Occupational Stress Questionnaire     Feeling of Stress : Only a little   Social Connections: Socially Integrated (05/10/2023)    Social Connection and Isolation Panel [NHANES]     Frequency of Communication with Friends and Family: More than three times a week     Frequency of Social Gatherings with Friends and Family: More than three times a week     Attends Religious Services: More than 4 times per year     Active Member of [employer] or Organizations: Yes     Attends Engineer, structural: More than 4 times per year     Marital Status: Married   Catering manager Violence: Not At Risk (05/10/2023)    Humiliation, Afraid, Rape, and Kick questionnaire     Fear of Current or Ex-Partner: No     Emotionally Abused: No     Physically Abused: No     Sexually Abused: No   Housing Stability: Low Risk  (05/10/2023)    Housing Stability Vital Sign     Unable to Pay for Housing in the Last Year: No     Number of Times Moved in the Last Year: 0     Homeless in the Last Year: No   [6]   Current Outpatient Medications   Medication Sig Dispense Refill    drospirenone -ethinyl estradiol  (Syeda ) 3-0.03 MG per tablet Take 1 tablet by mouth daily 84 tablet 3    ergocalciferol  (ERGOCALCIFEROL ) 1.25 MG (50000 UT) capsule Take 1 capsule (50,000 Units) by mouth once a week 12 capsule 1    levothyroxine  (SYNTHROID ) 112 MCG tablet Take 1 tablet (112 mcg) by mouth daily 100 tablet 3    lisinopril  (ZESTRIL ) 10 MG tablet Take 1 tablet (10 mg) by mouth daily 100 tablet 3     No current facility-administered medications for this visit.   [7] No Known Allergies

## 2023-05-20 NOTE — Progress Notes (Signed)
 Discussed during today's visit

## 2023-05-21 ENCOUNTER — Encounter (INDEPENDENT_AMBULATORY_CARE_PROVIDER_SITE_OTHER): Payer: Self-pay | Admitting: Surgery

## 2023-05-21 ENCOUNTER — Ambulatory Visit (INDEPENDENT_AMBULATORY_CARE_PROVIDER_SITE_OTHER): Payer: No Typology Code available for payment source | Admitting: Surgery

## 2023-05-21 VITALS — BP 135/84 | HR 78 | Temp 98.2°F | Ht 67.0 in | Wt 133.0 lb

## 2023-05-21 DIAGNOSIS — Z4889 Encounter for other specified surgical aftercare: Secondary | ICD-10-CM

## 2023-05-21 DIAGNOSIS — Z09 Encounter for follow-up examination after completed treatment for conditions other than malignant neoplasm: Secondary | ICD-10-CM

## 2023-05-21 NOTE — Progress Notes (Signed)
 Peggy Kennedy Peggy Kennedy [name] is a 43 y.o. female who presents today for a General Surgery post-operative visit.  She underwent a posterior right neck subcutaneous mass which was associated with the muscular fascia on 05/10/23.      Since her operation, her pain was well managed with just oral NSAIDs for a day or two.    She notes a firm area at the surgical site and mentions a lymph node she feels is reactive, located in a different spot from the original issue. The bump was noticeable when bending her neck forward prior to surgery. She has been cautious about not having anything rub against the surgical site, especially given the cold weather requiring her to wear a jacket.    She denies fever, chills, nausea, vomiting, diarrhea, constipation or incision complications.  Objective   Vital Signs  Temperature: 98.2 F (36.8 C) (Temporal)  Heart Rate: 78  BP:  135/84  SaO2:  98%  Weight: 60.3 kg (133 lb)  Height:  1.702 m (5\' 7" )  Body mass index is 20.83 kg/m.    Incision:    Physical Exam  Neck:         Physical Exam  NECK: Neck incision healing with slight redness. Right neck lymph node palpable, likely reactive.    Pathology reviewed with patient:     Final Diagnosis   Right posterior neck mass, excision: Unremarkable dense muscular and fibrous tissue with minimal adipose tissue, suggestive for scar          Assessment   11 days S/P excision of neck mass - benign scar tissue     Assessment & Plan  Posterior neck mass  Excised mass confirmed as scar tissue. Minimal post-procedure pain managed with ibuprofen  and acetaminophen . Normal firmness at surgical site expected to resolve in three months. Reactive lymph node likely due to procedure, expected to settle in one month.  - Monitor surgical site for changes in firmness and lymph node size.  - Advise use of Moderma cream or silicone gel for scar management after one to two weeks.  - Return for evaluation if lymph node remains enlarged or concerns arise in  two and a half months.      Plan     [email or web address]    Brannon Calamity, MD FACS    Verbal consent obtained to record this visit.

## 2023-05-23 ENCOUNTER — Ambulatory Visit (HOSPITAL_BASED_OUTPATIENT_CLINIC_OR_DEPARTMENT_OTHER): Payer: Self-pay

## 2023-05-24 ENCOUNTER — Other Ambulatory Visit: Payer: Self-pay

## 2023-05-24 ENCOUNTER — Other Ambulatory Visit (INDEPENDENT_AMBULATORY_CARE_PROVIDER_SITE_OTHER): Payer: Self-pay

## 2023-05-25 ENCOUNTER — Other Ambulatory Visit (HOSPITAL_BASED_OUTPATIENT_CLINIC_OR_DEPARTMENT_OTHER): Payer: Self-pay

## 2023-05-30 ENCOUNTER — Other Ambulatory Visit (INDEPENDENT_AMBULATORY_CARE_PROVIDER_SITE_OTHER): Payer: Self-pay | Admitting: Internal Medicine

## 2023-05-30 DIAGNOSIS — B001 Herpesviral vesicular dermatitis: Secondary | ICD-10-CM

## 2023-05-31 NOTE — Telephone Encounter (Signed)
 Refill sent.

## 2023-05-31 NOTE — Telephone Encounter (Signed)
 Last filled June 2024.   Last o/v April 2025.  Patient has an upcoming appointment on May 22 2024.  Queued up  20  with 0 refills.

## 2023-06-04 ENCOUNTER — Other Ambulatory Visit (HOSPITAL_BASED_OUTPATIENT_CLINIC_OR_DEPARTMENT_OTHER): Payer: Self-pay

## 2023-06-04 ENCOUNTER — Ambulatory Visit (INDEPENDENT_AMBULATORY_CARE_PROVIDER_SITE_OTHER)

## 2023-06-04 ENCOUNTER — Encounter (HOSPITAL_BASED_OUTPATIENT_CLINIC_OR_DEPARTMENT_OTHER): Payer: Self-pay

## 2023-06-04 VITALS — BP 100/66 | HR 81 | Resp 16 | Ht 67.0 in | Wt 140.0 lb

## 2023-06-04 DIAGNOSIS — D4862 Neoplasm of uncertain behavior of left breast: Secondary | ICD-10-CM

## 2023-06-04 DIAGNOSIS — R928 Other abnormal and inconclusive findings on diagnostic imaging of breast: Secondary | ICD-10-CM

## 2023-06-04 DIAGNOSIS — R921 Mammographic calcification found on diagnostic imaging of breast: Secondary | ICD-10-CM

## 2023-06-04 DIAGNOSIS — N6325 Unspecified lump in the left breast, overlapping quadrants: Secondary | ICD-10-CM

## 2023-06-04 NOTE — Progress Notes (Unsigned)
 [facility]  [address], [address], [place], [place] [address]  [phone]      Chief complaint : Discordant breast biopsy    History of Present Illness:     Ms Peggy Kennedy is a 43 y.o. female patient referred by  Shellye Dibble, MD for evaluation and management of a discordant breast biopsy. She has no breast complaints.  She denies breast masses, skin changes, new breast asymmetry, breast pain, nipple discharge, or nipple retraction.     The patient underwent a bilateral screening mammogram with tomosynthesis at [facility] on 04/05/2023 that showed her breast tissue is heterogeneously dense and there were questionable calcifications in the upper outer left breast.  There was also a questionable asymmetry in the middle third of the left breast on the MLO view.  There were no concerning findings on the right.  A left diagnostic mammogram was performed on 04/12/2023 that showed a 1 cm span of grouped calcifications in the upper left breast with similar findings in the retroareolar breast.  A targeted left breast ultrasound was performed that showed an irregular shadowing mass at 2:00, 4 cm from the nipple measuring 9 x 7 x 5 mm.  Her axillary lymph nodes were normal in appearance.  Ultrasound-guided core needle biopsy of the left breast 2:00 mass was performed on 04/16/2023 with pathology showing benign breast tissue with variably dense stroma.  A HydroMARK open coil clip was placed in the appropriate position on the postbiopsy mammogram.  This was deemed discordant.  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  A HydroMARK butterfly clip was placed in an appropriate position on the postbiopsy mammogram.  Stereotactic core biopsy of the retroareolar lower calcifications was also performed that showed benign findings  associated with pseudoangiomatous stromal hyperplasia.  A HydroMARK barrel clip was placed in an appropriate position on the postbiopsy mammogram.  She underwent a breast MRI on 05/24/2023 that showed focal non-mass enhancement associated with the open coil biopsy clip at 2:00, 4 cm from the nipple measuring 9 x 7 x 8 mm with non-mass enhancement extending superiorly to the butterfly marker.  There is no suspicious enhancement at the barrel clip.  Her axillary lymph nodes were normal.    All films and reports were reviewed      Review of Systems  A comprehensive review of systems was performed and the results are listed at the bottom of this note    Past Medical History:  Medical History[1]     Problem List:  Problem List[2]    Past Surgical History:  Past Surgical History[3]     Medications:  Current Medications[4]     Allergies:  Allergies[5]     GYN History:    Gynecological History   Age of menarche:: 66           G:: 4     P:: 2   Hormone Replacement therapy:: No     Age at first delivery:: 37      Bra size:: 36C      Last mammogram:: 04/05/23   Marital status:: married     Nipple discharge:: No   Ethnicity:: caucasian           Past Family History:  Family History[6]     Past Social History:   Social History[7]    Physical Exam  Physical Exam: 06/06/2023  Vitals:    06/04/23 1325   BP: 100/66   Pulse: 81   Resp: 16   SpO2: 100%      Constitutional: Vitals reviewed and Well-developed, Well-noursished  Eyes: Normal conjunctiva and lids, Normal pupils    ENT: Normal appearance of external ears and nose, Normal nasal mucosa,  Normal lips, teeth and gums  Neck: Supple, no masses   Chest: Normal     Lymphatic:  No cervical, supraclavicular adenopathy present and No axillary adenopathy present  Respiratory: Normal effort, breath sounds  Cardiovascular: Normal Rate, regular rhythm and heart sounds, No edema, No thrills.   Normal Pedal pulses  Abdominal:  No masses or tenderness, normal bowel sounds, No guarding, No  hepatosplenomegaly  Musculoskeletal: Normal gait  No cyanosis,  Normal ROM with no pain  Skin:  Warm, dry and intact, No rashes, lesions, or ulcers  Neurologic:  No deficits, normal gait  Psychiatric: Normal judgment and insight, Alert and oriented to person, place and time, Normal recent and remote memory and Normal mood and affect, no distress    Breasts:   Symmetry: symmetric  Scars: None  Right breast: Presence: Present            Skin:negative             Nipple:normal            Tissue: Within normal limits  Left breast: Presence:Present            Skin:negative             Nipple:normal            Tissue: Within normal limits    Radiology:      Assessment/Plan  1.left discordant breast biopsy    This is a 43 y.o. year old female patient who presented with an abnormal mammogram characterized by heterogeneously dense breast tissue, 2 groups of clustered calcifications and a hypoechoic shadowing mass in the left upper outer quadrant.  There is also MRI enhancement of the area.  Ultrasound-guided core needle biopsy showed benign findings but these are deemed to be discordant.  The findings associated with the 2 upper outer quadrant biopsy clips are suspicious based on imaging.  Thus I agree with the recommendation from radiology that this is discordant and a surgical excision would be recommended.    I would recommend a left intraoperative wire localized x2 excisional breast biopsy (periareolar incision, steri-strips ok). Target the 2-3 o'clock clips. We do not need to excise the benign tissue around the peri-areolar clip. I have explained the procedure, risks and benefits .  Potential complications include but are not limited to bleeding, infection, hematoma, chronic pain, chronic seroma, dissatisfaction with appearance of the breast or incision, recurrence of the mass, and need for additional surgery.  Most patients, however, experience only mild swelling , ecchymosis, or pain.  The surgery is an outpatient  surgery.  You are discharged home the same day.  Pain is usually minimal.  I recommend the use of a supportive brassiere, and the use of schedule Tylenol  and a nonsteroidal anti-inflammatory agent such as ibuprofen  or naproxen for 5 days.  A narcotic prescription for breakthrough pain can be prescribed.  You are expected to return to normal activity 2 weeks after surgery.      Answers submitted by the patient for this visit:  Patient Intake Form (Submitted on 05/21/2023)  Chief Complaint: Symptoms  Did you ever breast feed?: Yes  fever: No  chills: No  Night sweats: No  fatigue: No  Unexpected weight loss: No  Unexpected weight gain: No  Appetite change: No  Frequent thirst: No  Double vision: No  Eye discharge: No  eye pain: No  Eye redness: No  hearing loss: No  Ringing in ears: No  ear pain: No  congestion: No  Sinus pain: No  Nose bleeds: No  sore throat: No  trouble swallowing: No  Voice change: No  difficulty breathing: No  Pain with breathing: No  Chronic cough: No  Bloody sputum: No  wheezing: No  Snoring: No  chest pain: No  palpitations: No  leg swelling: No  Pain walking: No  leg pain: No  Pacemaker problems: No  Irregular heartbeat: No  nausea: No  vomiting: No  diarrhea: No  abdominal pain: No  Bloody stool: No  constipation: No  heartburn: No  Regurgitation: No  Cold intolerance: No  Heat intolerance: No  Hot flashes: No  Hair loss: No  Difficulty urinating: No  urinary pain: No  Urinary frequency: No  bladder incontinence: No  Blood in urine: No  vaginal bleeding: No  vaginal discharge: No  pelvic pain: No  arthralgias: No  back pain: Yes  flank pain: No  myalgias: No  neck pain: No  Neck stiffness: No  muscle weakness: No  itching: No  rash: No  Hives: No  Wound: No  Skin changes: No  dizziness: No  headaches: Yes  seizures: No  numbness: No  tingling: No  tremors: No  weakness: No  memory loss: No  confusion: No  speech difficulty: No  Bruise or bleeds easily: No  Lymph node problems:  No  nervous/anxious: No  Depressed mood: No  Feeling agitated: No  Panic attack: No  Questionnaire about: Symptoms (Submitted on 05/21/2023)  Chief Complaint: Symptoms         [1]   Past Medical History:  Diagnosis Date    Back pain     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    On enoxaparin 40 mg daily  On enoxaparin 40 mg daily    Hypertension 2023    Hypothyroid     Multiple benign melanocytic nevi 02/18/2016   [2]   Patient Active Problem List  Diagnosis    Acquired hypothyroidism    DDD (degenerative disc disease), lumbar    PCOS (polycystic ovarian syndrome)    Primary hypertension    Family history of coronary artery disease in father    Nonrheumatic aortic valve insufficiency    Aortic ectasia, thoracic    Mass of right side of neck    Subcutaneous mass of neck   [3]   Past Surgical History:  Procedure Laterality Date    ABDOMINAL SURGERY  08/2021    Gallbladder removal    CESAREAN SECTION  02/2017    CHOLECYSTECTOMY  08/2021    EXCISION, SOFT TISSUE Right 05/10/2023    Procedure: EXCISION OF POSTERIOR NECK MASS,RIGHT;  Surgeon: Brannon Calamity, MD;  Location: [facility] MAIN OR;  Service: General;  Laterality: Right;    LAPAROSCOPIC, CHOLECYSTECTOMY  08/24/2022    Dr. Scriven at [facility]   [4]   Current Outpatient Medications:     drospirenone -ethinyl estradiol  (Syeda ) 3-0.03 MG per tablet, Take 1 tablet by mouth daily, Disp: 84 tablet, Rfl: 3    ergocalciferol  (ERGOCALCIFEROL ) 1.25 MG (50000 UT) capsule, Take 1 capsule (50,000 Units) by mouth once a week, Disp: 12 capsule, Rfl: 1  levothyroxine  (SYNTHROID ) 112 MCG tablet, Take 1 tablet (112 mcg) by mouth daily, Disp: 100 tablet, Rfl: 3    lisinopril  (ZESTRIL ) 10 MG tablet, Take 1 tablet (10 mg) by mouth daily, Disp: 100 tablet, Rfl: 3  [5] No Known Allergies  [6]   Family History  Problem Relation Name Age of Onset    Hypertension Mother Father     Cholelithiasis Mother Father     Hypertension Father Millard Alliance     Cancer Father Millard Alliance 27         stomach cancer    Cholelithiasis Father Millard Alliance     Stroke Father Millard Alliance     Lung cancer Maternal Grandfather      Melanoma Paternal Grandfather          died of disease; met to liver    Breast cancer Paternal Aunt Aunt Sonia 67 - 60   [7]   Social History  Socioeconomic History    Marital status: Married   Tobacco Use    Smoking status: Never     Passive exposure: Never    Smokeless tobacco: Never   Vaping Use    Vaping status: Never Used   Substance and Sexual Activity    Alcohol use: Not Currently     Comment: occassionally    Drug use: Never    Sexual activity: Yes     Partners: Male     Birth control/protection: Pill     Comment: Birth control     Social Drivers of Health     Financial Resource Strain: Low Risk  (05/10/2023)    Overall Financial Resource Strain (CARDIA)     Difficulty of Paying Living Expenses: Not very hard   Food Insecurity: No Food Insecurity (05/10/2023)    Hunger Vital Sign     Worried About Running Out of Food in the Last Year: Never true     Ran Out of Food in the Last Year: Never true   Transportation Needs: No Transportation Needs (05/10/2023)    PRAPARE - Therapist, art (Medical): No     Lack of Transportation (Non-Medical): No   Physical Activity: Insufficiently Active (05/10/2023)    Exercise Vital Sign     Days of Exercise per Week: 3 days     Minutes of Exercise per Session: 30 min   Stress: No Stress Concern Present (05/10/2023)    [employer] of Occupational Health - Occupational Stress Questionnaire     Feeling of Stress : Only a little   Social Connections: Socially Integrated (05/10/2023)    Social Connection and Isolation Panel [NHANES]     Frequency of Communication with Friends and Family: More than three times a week     Frequency of Social Gatherings with Friends and Family: More than three times a week     Attends Religious Services: More than 4 times per year     Active Member of [employer] or Organizations: Yes     Attends Banker  Meetings: More than 4 times per year     Marital Status: Married   Catering manager Violence: Not At Risk (05/10/2023)    Humiliation, Afraid, Rape, and Kick questionnaire     Fear of Current or Ex-Partner: No     Emotionally Abused: No     Physically Abused: No     Sexually Abused: No   Housing Stability: Low Risk  (05/10/2023)    Housing Stability Vital Sign     Unable  to Pay for Housing in the Last Year: No     Number of Times Moved in the Last Year: 0     Homeless in the Last Year: No

## 2023-06-06 NOTE — Procedures (Unsigned)
 Procedure Note  Diagnostic Breast Ultrasound    Surgeon: Blake Bun, MD  Procedure : left breast diagnostic ultrasound  Indications :Left breast preoperative evaluation to determine surgical localization    Report:  With the patient in the supine position and the arm abducted the target area(s) was/were located and examined. A SonoSite Ultrasound, using a [id number]/[id number] breast linear transducer was used..    Breast laterality:  Left  Area scanned: Breast    Findings:  Ultrasound interrogation of the left breast demonstrated 2 HydroMARK clips adjacent to each other at 3:00, 3-5cm from the nipple and a third hydroMARK is seen at 6:00 retroareolar location.       Impression/Plan:   Sonographic hydro Mark clip is identified by ultrasound.  We will schedule for surgery.      Pictures were taken.This was discussed with the patient at length.

## 2023-06-07 ENCOUNTER — Encounter (HOSPITAL_BASED_OUTPATIENT_CLINIC_OR_DEPARTMENT_OTHER): Payer: Self-pay

## 2023-06-08 ENCOUNTER — Telehealth (HOSPITAL_BASED_OUTPATIENT_CLINIC_OR_DEPARTMENT_OTHER): Payer: Self-pay

## 2023-06-08 NOTE — Progress Notes (Signed)
 PSS Chart Review    06/04/23 - Dr Bettyjane Brunet LOV note - epic note    04/15/23 epic labs - T4 (WNL), TSH (WNL)    06/04/23 US  breast left Ltd - epic imaging    05/24/23 - MRI breast bilateral - epic imaging    03/22/23 - US  head and neck - epic imaging    06/21/23 - echo in cardiac Proc    PSS appointment pending - scheduled 06/09/23

## 2023-06-08 NOTE — Telephone Encounter (Signed)
 Patient called prep assessment to schedule it.  Patient was informed that there is no surgery for her to schedule a prep assessment for.  Patient was given a surgery date of Jun 14, 2023.  Patient informed me that she reached out yesterday but did not hear back.  Patient is asking for a call back as soon as possible as she is starting to get nervous.    Call back number is [phone]

## 2023-06-09 ENCOUNTER — Ambulatory Visit (INDEPENDENT_AMBULATORY_CARE_PROVIDER_SITE_OTHER)

## 2023-06-09 ENCOUNTER — Encounter (INDEPENDENT_AMBULATORY_CARE_PROVIDER_SITE_OTHER): Payer: Self-pay

## 2023-06-09 NOTE — PSS Phone Screening (Signed)
 Documentation already in chart:  05/19/23 [facility] P[facility] annual exam in notes   04/15/23 TFTs   12/18/22 [facility] visit in notes   06/21/22 ECHO  07/05/22 CT Cardiac Scoring     Pre-Anesthesia Evaluation    Pre-op phone visit requested by:   Reason for pre-op phone visit: Patient anticipating BIOPSY, LEFT BREAST, WIRE LOCALIZATION procedure.    Language Assistant  Interpreter: N/A - English is preferred language    Orders Placed This Encounter   Procedures    Urine HCG Qualitative       History of Present Illness/Summary:        Problem List:  Medical Problems       Hospital Problem List  Date Reviewed: 04/15/2023   None        Non-Hospital Problem List  Date Reviewed: 04/15/2023          ICD-10-CM Priority Class Noted Diagnosed    Acquired hypothyroidism E03.9   07/03/2018     Overview Addendum 02/27/2022  8:46 AM by Shellye Dibble, MD   Has Hashimoto's thyroiditis, has hypothyroidism. Takes levothyroxine  . Since she had son who is 2.5yo it has been all over the place. Has felt it was messed up since then. Would like to recheck. Also feels that there's something in her throat.     Since last visit: TSH high last check. Dose increase Would like to recheck. Due for refill of the 137mcg.     Lab Results   Component Value Date    TSH 7.31 (H) 12/03/2021    T4FREE 1.04 12/03/2021       Lab Results   Component Value Date    TSH 0.02 (L) 07/08/2021    T4FREE 1.28 07/08/2021     Lab Results   Component Value Date    TSH 0.02 (L) 07/08/2021    T4FREE 1.28 07/08/2021        Lab Results   Component Value Date    TSH 0.02 (L) 07/08/2021    T4FREE 1.28 07/08/2021             DDD (degenerative disc disease), lumbar M51.369   07/03/2018     Overview Addendum 02/27/2022  8:53 AM by Shellye Dibble, MD   Feels like it continues and will likely need surgery at some point. But feels the difficulty with weight loss is not helping.     Since last visit: continues with pain. Needs to have surgery, but has to decide when it works for her  schedule.          PCOS (polycystic ovarian syndrome) E28.2   08/09/2018     Overview Signed 02/27/2022  8:55 AM by Shellye Dibble, MD   Symptoms of acne and irregular menses controlled with change in OCP and now on yasmin . Sees GYN         Primary hypertension I10   12/03/2021     Overview Addendum 02/27/2022  8:47 AM by Shellye Dibble, MD   Likely new diagnosis.   Got home machine and has checked the readings. Tends to be 130-140s/90s.   Does have family history. Dad diagnosed in his 78s, mom also and was diagnosed in 9s. Brother in his 9s also diagnosed.     Since last visit: doing well. Did see cardiology and has some studies pending.     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 Always use your most recent med list.                drospirenone -ethinyl estradiol  3-0.03 MG per tablet  Take 1 tablet by mouth daily  Commonly known as: Syeda   Medication Adjustments for Surgery: Take as prescribed     ergocalciferol  1.25 MG (50000 UT) capsule  Take 1 capsule (50,000 Units) by mouth once a week  Commonly known as:  ERGOCALCIFEROL   Medication Adjustments for Surgery: Hold day of surgery     levothyroxine  112 MCG tablet  Take 1 tablet (112 mcg) by mouth daily  Commonly known as: SYNTHROID   Medication Adjustments for Surgery: Take as prescribed     lisinopril  10 MG tablet  Take 1 tablet (10 mg) by mouth daily  Commonly known as: ZESTRIL   Medication Adjustments for Surgery: Take as prescribed            Allergies[2]  Family History[3]  Social History     Occupational History    Not on file   Tobacco Use    Smoking status: Never     Passive exposure: Never    Smokeless tobacco: Never   Vaping Use    Vaping status: Never Used   Substance and Sexual Activity    Alcohol use: Not Currently     Comment: occassionally    Drug use: Never    Sexual activity: Yes     Partners: Male     Birth control/protection: Pill     Comment: Birth control       Menstrual History:   LMP / Status  Oral contraceptive      Patient's last menstrual period was 05/19/2023.    Tubal Ligation?  No valid surgical or medical questions entered.           Exam Scores:   SDB score           STBUR score       PONV score       MST score  MST Score: 0    PEN-FAST score       Frailty score       CHADsVasc            Visit Vitals  Ht 1.702 m (5\' 7" )   Wt 59 kg (130 lb)  LMP 05/19/2023   BMI 20.36 kg/m                    Recent Labs   Other (last 180 days) 02/22/23  1420 04/15/23  1414   TSH 0.04* 0.36                      [1]   Past Surgical History:  Procedure Laterality Date    CESAREAN SECTION  02/2017    EXCISION, SOFT TISSUE Right 05/10/2023    Procedure: EXCISION OF POSTERIOR NECK MASS,RIGHT;  Surgeon: Brannon Calamity, MD;  Location: [facility];  Service: General;  Laterality: Right;    LAPAROSCOPIC, CHOLECYSTECTOMY  08/24/2022    Dr[facility] Alvaro [facility] at [facility]   [2] No Known Allergies  [3]   Family History  Problem Relation Name Age of Onset    Hypertension Mother Father     Cholelithiasis Mother Father     Hypertension Father Millard Alliance     Cancer Father Millard Alliance  64        stomach cancer    Cholelithiasis Father Millard Alliance     Stroke Father Millard Alliance     Lung cancer Maternal Grandfather      Melanoma Paternal Grandfather          died of disease; met to liver    Breast cancer Paternal Aunt Aunt Sonia 50 - 60

## 2023-06-13 NOTE — Discharge Instr - AVS First Page (Addendum)
 Post-operative Instructions for Peggy Kennedy     Date of Discharge: 06/14/2023    Do not drive, operate machinery, or any hazardous activity for at least 24 hours following anesthesia or sedation and while taking narcotics    Do not drink alcohol for at least 24 hours and while taking narcotics    Do not sign important papers or make important business decisions for 24 hours following your surgery    Do not stay alone for 24 hours following surgery    As soon as your pathology report is signed it will appear in MyChart and if you have alerts set up, you will be notified that the results are available to view.  Once I see the results, we will contact you to discuss further.     Care at Home:    Advance diet as tolerated.    You have steri-strips over your incision and and overlying dressing. The dressing is waterproof. You may shower today. Please take the overlying dressing over (clear plastic and gauze) 2 days after surgery. Please the steri-strips on until they peel off.       No strenuous physical activity prior to post-operative appointment    Move arms/ shoulders     Wear your bra. You make take it off to sleep. If you were given a surgical bra, you may wash it in the washing machine. Wear the post-surgical bra or whatever bra is most comfortable for at least 5 days while you are up moving around.    Pain Management    Take acetaminophen  (Tylenol ) - either 325mg  (regular stength) of 500mg  (extra strength). Depending on your dose, we would like you to schedule tylenol  three times a day (after you wake up, mid-day and before bed). If you have regular strength tylenol  take 3 tabs (975mg ) three times a day. If you have extra strength, take 2 tablets (1000mg ) three times a day. If you are able to take ibuprofen , you may take 600mg  (3 tablets) three times a day (concurrently with tylenol ). Take ibuprofen  with food. These medications decrease inflammation associated with your surgery and will  improve post-operative pain. Please schedule for 3-5 days and you may continue as needed.    You may have been given a narcotic tablet. You make take these on top of the medications above for break through pain.     If you were not provided a prescription for a narcotic and feel that your pain is not adequately controlled, call the [facility] and a prescription can be sent to your pharmacy    Narcotic pain medication can cause constipation.  Please use over the counter medication (Senakot S, Milk of Mag, Prune Juice, Peri-Colace, etc.) to prevent and alleviate constipation post operatively.    Do not take any aspirin until cleared by your surgeon to do so.    If you do get constipated, remember that Colace and other stool softeners are NOT laxatives.  If you have not had a bowel movement in a few days or become bloated you will need to use something stronger.  You may try prune juice or a mild laxative that has worked for you in the past, but you will very likely require something stronger.  A daily dose of Miralax works very well for most patients.  The most effective and quickest acting solution is usually a suppository or enema.  If you are constipated, you should consider sending someone to the pharmacy for an over the  counter suppository (Dulcolax or similar products) and an over the counter enema (Fleets enema or other products).  Try one and then the other if needed.  If nothing provides relief, contact the [facility].    Call Dr. Bettyjane Brunet on her cell at [phone] for the following:    Temperature greater than 101.4 degrees F.  For uncontrolled pain  Excessive bleeding, swelling, or pain at surgical site  Persistent nausea or vomiting  Foul smelling discharge from surgical wounds      Make sure you have a post-operative appointment within 2 weeks following surgery at the [facility].    I have received and understand verbal and written  instructions    ______________________________________________  Patient/Caregiver Signature

## 2023-06-14 ENCOUNTER — Other Ambulatory Visit: Payer: Self-pay

## 2023-06-14 ENCOUNTER — Ambulatory Visit

## 2023-06-14 ENCOUNTER — Ambulatory Visit
Admission: RE | Admit: 2023-06-14 | Discharge: 2023-06-14 | Disposition: A | Discharge status: Home / Self Care | Source: Ambulatory Visit

## 2023-06-14 ENCOUNTER — Ambulatory Visit: Admitting: Internal Medicine

## 2023-06-14 ENCOUNTER — Encounter: Admission: RE | Disposition: A | Discharge status: Home / Self Care | Payer: Self-pay | Source: Ambulatory Visit

## 2023-06-14 DIAGNOSIS — R221 Localized swelling, mass and lump, neck: Secondary | ICD-10-CM | POA: Insufficient documentation

## 2023-06-14 DIAGNOSIS — I351 Nonrheumatic aortic (valve) insufficiency: Secondary | ICD-10-CM | POA: Insufficient documentation

## 2023-06-14 DIAGNOSIS — N6489 Other specified disorders of breast: Secondary | ICD-10-CM | POA: Insufficient documentation

## 2023-06-14 DIAGNOSIS — I10 Essential (primary) hypertension: Secondary | ICD-10-CM | POA: Insufficient documentation

## 2023-06-14 DIAGNOSIS — D4862 Neoplasm of uncertain behavior of left breast: Secondary | ICD-10-CM | POA: Diagnosis present

## 2023-06-14 DIAGNOSIS — N6011 Diffuse cystic mastopathy of right breast: Secondary | ICD-10-CM | POA: Insufficient documentation

## 2023-06-14 DIAGNOSIS — Z9889 Other specified postprocedural states: Secondary | ICD-10-CM

## 2023-06-14 DIAGNOSIS — N6012 Diffuse cystic mastopathy of left breast: Secondary | ICD-10-CM | POA: Insufficient documentation

## 2023-06-14 DIAGNOSIS — E039 Hypothyroidism, unspecified: Secondary | ICD-10-CM | POA: Insufficient documentation

## 2023-06-14 DIAGNOSIS — I77810 Thoracic aortic ectasia: Secondary | ICD-10-CM | POA: Insufficient documentation

## 2023-06-14 HISTORY — PX: BIOPSY, BREAST, WIRE LOCALIZATION: SHX00177

## 2023-06-14 LAB — URINE HCG QUALITATIVE: Urine HCG Qualitative: NEGATIVE

## 2023-06-14 SURGERY — BIOPSY, BREAST, WIRE LOCALIZATION
Anesthesia: Anesthesia General | Site: Breast | Laterality: Left | Wound class: Clean

## 2023-06-14 MED ORDER — LIDOCAINE HCL 2 % IJ SOLN
INTRAMUSCULAR | Status: DC | PRN
Start: 2023-06-14 — End: 2023-06-14
  Administered 2023-06-14: 100 mg

## 2023-06-14 MED ORDER — SCOPOLAMINE 1 MG/3DAYS TD PT72
1.0000 | MEDICATED_PATCH | Freq: Once | TRANSDERMAL | Status: DC
Start: 2023-06-14 — End: 2023-06-14
  Administered 2023-06-14: 1 via TRANSDERMAL

## 2023-06-14 MED ORDER — ACETAMINOPHEN 500 MG PO TABS
1000.0000 mg | ORAL_TABLET | Freq: Once | ORAL | Status: AC
Start: 2023-06-14 — End: 2023-06-14
  Administered 2023-06-14: 1000 mg via ORAL

## 2023-06-14 MED ORDER — MIDAZOLAM HCL 1 MG/ML IJ SOLN (WRAP)
INTRAMUSCULAR | Status: AC
Start: 2023-06-14 — End: ?
  Filled 2023-06-14: qty 2

## 2023-06-14 MED ORDER — PROPOFOL 10 MG/ML IV EMUL (WRAP)
INTRAVENOUS | Status: DC | PRN
Start: 2023-06-14 — End: 2023-06-14
  Administered 2023-06-14: 50 mg via INTRAVENOUS

## 2023-06-14 MED ORDER — FAMOTIDINE 10 MG/ML IV SOLN (WRAP)
INTRAVENOUS | Status: DC | PRN
Start: 2023-06-14 — End: 2023-06-14
  Administered 2023-06-14: 20 mg via INTRAVENOUS

## 2023-06-14 MED ORDER — OXYCODONE-ACETAMINOPHEN 5-325 MG PO TABS
1.0000 | ORAL_TABLET | Freq: Once | ORAL | Status: DC | PRN
Start: 2023-06-14 — End: 2023-06-14

## 2023-06-14 MED ORDER — LACTATED RINGERS IV SOLN
INTRAVENOUS | Status: DC
Start: 2023-06-14 — End: 2023-06-14
  Filled 2023-06-14: qty 1000

## 2023-06-14 MED ORDER — DEXAMETHASONE SODIUM PHOSPHATE 4 MG/ML IJ SOLN (WRAP)
INTRAMUSCULAR | Status: DC | PRN
Start: 2023-06-14 — End: 2023-06-14
  Administered 2023-06-14: 4 mg via INTRAVENOUS

## 2023-06-14 MED ORDER — FENTANYL CITRATE (PF) 50 MCG/ML IJ SOLN (WRAP)
INTRAMUSCULAR | Status: AC
Start: 2023-06-14 — End: ?
  Filled 2023-06-14: qty 1

## 2023-06-14 MED ORDER — ACETAMINOPHEN 500 MG PO TABS
ORAL_TABLET | ORAL | Status: AC
Start: 2023-06-14 — End: ?
  Filled 2023-06-14: qty 2

## 2023-06-14 MED ORDER — CEFAZOLIN SODIUM 1 G IJ SOLR
INTRAMUSCULAR | Status: AC
Start: 2023-06-14 — End: ?
  Filled 2023-06-14: qty 2000

## 2023-06-14 MED ORDER — FAMOTIDINE 10 MG/ML IV SOLN (WRAP)
INTRAVENOUS | Status: AC
Start: 2023-06-14 — End: ?
  Filled 2023-06-14: qty 2

## 2023-06-14 MED ORDER — FENTANYL CITRATE (PF) 50 MCG/ML IJ SOLN (WRAP)
25.0000 ug | INTRAMUSCULAR | Status: DC | PRN
Start: 2023-06-14 — End: 2023-06-14

## 2023-06-14 MED ORDER — MIDAZOLAM HCL 1 MG/ML IJ SOLN (WRAP)
INTRAMUSCULAR | Status: DC | PRN
Start: 2023-06-14 — End: 2023-06-14
  Administered 2023-06-14: 2 mg via INTRAVENOUS

## 2023-06-14 MED ORDER — PROPOFOL INFUSION 10 MG/ML
INTRAVENOUS | Status: DC | PRN
Start: 2023-06-14 — End: 2023-06-14
  Administered 2023-06-14: 100 ug/kg/min via INTRAVENOUS

## 2023-06-14 MED ORDER — PROPOFOL 10 MG/ML IV EMUL (WRAP)
INTRAVENOUS | Status: AC
Start: 2023-06-14 — End: ?
  Filled 2023-06-14: qty 50

## 2023-06-14 MED ORDER — FAMOTIDINE 10 MG/ML IV SOLN (WRAP)
20.0000 mg | Freq: Once | INTRAVENOUS | Status: AC
Start: 2023-06-14 — End: 2023-06-14
  Administered 2023-06-14: 20 mg via INTRAVENOUS

## 2023-06-14 MED ORDER — STERILE WATER FOR INJECTION IJ/IV SOLN (WRAP)
2.0000 g | INTRAMUSCULAR | Status: AC
Start: 2023-06-14 — End: 2023-06-14
  Administered 2023-06-14: 2 g via INTRAVENOUS

## 2023-06-14 MED ORDER — BUPIVACAINE-EPINEPHRINE (PF) 0.25% -1:200000 IJ SOLN
INTRAMUSCULAR | Status: AC
Start: 2023-06-14 — End: ?
  Filled 2023-06-14: qty 30

## 2023-06-14 MED ORDER — KETOROLAC TROMETHAMINE 30 MG/ML IJ SOLN
INTRAMUSCULAR | Status: DC | PRN
Start: 2023-06-14 — End: 2023-06-14
  Administered 2023-06-14: 30 mg via INTRAVENOUS

## 2023-06-14 MED ORDER — OXYCODONE HCL 5 MG PO TABS
5.0000 mg | ORAL_TABLET | Freq: Once | ORAL | Status: DC | PRN
Start: 2023-06-14 — End: 2023-06-14

## 2023-06-14 MED ORDER — ONDANSETRON HCL 4 MG/2ML IJ SOLN
INTRAMUSCULAR | Status: DC | PRN
Start: 2023-06-14 — End: 2023-06-14
  Administered 2023-06-14: 4 mg via INTRAVENOUS

## 2023-06-14 MED ORDER — ONDANSETRON HCL 4 MG/2ML IJ SOLN
4.0000 mg | Freq: Once | INTRAMUSCULAR | Status: DC | PRN
Start: 2023-06-14 — End: 2023-06-14

## 2023-06-14 MED ORDER — TRAMADOL HCL 50 MG PO TABS
50.0000 mg | ORAL_TABLET | Freq: Four times a day (QID) | ORAL | 0 refills | Status: AC | PRN
Start: 2023-06-14 — End: ?
  Filled 2023-06-14: qty 5, 1d supply, fill #0

## 2023-06-14 MED ORDER — SCOPOLAMINE 1 MG/3DAYS TD PT72
MEDICATED_PATCH | TRANSDERMAL | Status: AC
Start: 2023-06-14 — End: ?
  Filled 2023-06-14: qty 1

## 2023-06-14 MED ORDER — LIDOCAINE HCL 1 % IJ SOLN
INTRAMUSCULAR | Status: DC | PRN
Start: 2023-06-14 — End: 2023-06-14
  Administered 2023-06-14: 50 mL via INTRAMUSCULAR

## 2023-06-14 MED ORDER — KETAMINE HCL 50 MG/ML IJ/IV SOLN (WRAP)
Status: AC
Start: 2023-06-14 — End: ?
  Filled 2023-06-14: qty 1

## 2023-06-14 MED ORDER — FENTANYL CITRATE (PF) 50 MCG/ML IJ SOLN (WRAP)
INTRAMUSCULAR | Status: AC
Start: 2023-06-14 — End: ?
  Filled 2023-06-14: qty 2

## 2023-06-14 MED ORDER — FENTANYL CITRATE (PF) 50 MCG/ML IJ SOLN (WRAP)
INTRAMUSCULAR | Status: DC | PRN
Start: 2023-06-14 — End: 2023-06-14
  Administered 2023-06-14: 25 ug via INTRAVENOUS
  Administered 2023-06-14 (×2): 50 ug via INTRAVENOUS

## 2023-06-14 MED ORDER — HYDROMORPHONE HCL 1 MG/ML IJ SOLN
0.5000 mg | INTRAMUSCULAR | Status: DC | PRN
Start: 2023-06-14 — End: 2023-06-14

## 2023-06-14 SURGICAL SUPPLY — 64 items
APPLIER INTERNAL CLIP L9.75 IN AUTOMATIC (Procedure Accessories) IMPLANT
APPLIER INTERNAL CLIP L9.75 IN AUTOMATIC PREMIUM SURGICLIP II SUPER (Procedure Accessories) IMPLANT
BLADE 15 CLASSIC CARBON STEEL TISSUE (Blade) ×1 IMPLANT
BLADE 15 CLASSIC CARBON STEEL TISSUE SURGICAL (Blade) ×1 IMPLANT
CLIP INTERNAL MEDIUM CHEVRON 6 CARTRIDGE (Clips) IMPLANT
CLIP INTERNAL MEDIUM CHEVRON 6 CARTRIDGE LIGATE TRIANGULATE CROSS (Clips) IMPLANT
CONTAINER SPEC PE 4OZ PRCS 2.5X3IN LF (Procedure Accessories) ×1 IMPLANT
DRAPE 96X5IN ISOSILK SMALL T TIP BAND TAPE STRIP GEL PROBE LATEX FREE (Procedure Accessories) ×1 IMPLANT
DRAPE PRB ISOSILK 96X5IN LF STRL SM T (Procedure Accessories) ×1 IMPLANT
DRESSING TELFA 3X8IN STERILE (Dressing) ×1 IMPLANT
DRESSING TRANSPARENT L4 3/4 IN X W4 IN (Dressing) ×2 IMPLANT
DRESSING TRANSPARENT L4 3/4 IN X W4 IN POLYURETHANE ADHESIVE (Dressing) ×1 IMPLANT
ELECTRODE ADULT PATIENT RETURN L9 FT REM POLYHESIVE ACRYLIC FOAM (Procedure Accessories) ×1 IMPLANT
ELECTRODE ELECTROSRGCL BLADE L2.75IN OD3/32 IN L.2IN STD SHAFT HEX LCK (Cautery) ×1 IMPLANT
ELECTRODE ELECTROSURGICAL BLADE L2.75 IN (Cautery) ×1 IMPLANT
ELECTRODE PATIENT RETURN L9 FT VALLEYLAB (Procedure Accessories) ×1 IMPLANT
GEL ULTRASOUND AQUASONIC CLEAR DOPPLER TRANSMISSION BACTERIOSTATIC (Procedure Accessories) ×1 IMPLANT
GEL US AQSN C DPLR TRNMS BCTRST FRHYD FR (Procedure Accessories) ×1 IMPLANT
GLOVE SURGICAL 6.5 BIOGEL PI MICRO (Glove) ×1 IMPLANT
GLOVE SURGICAL 6.5 BIOGEL PI MICRO POWDER FREE BEAD CUFF MICRO ROUGHEN (Glove) ×1 IMPLANT
GLOVE SURGICAL 7 BIOGEL PI INDICATOR (Glove) ×1 IMPLANT
GLOVE SURGICAL 7 BIOGEL PI INDICATOR UNDERGLOVE POWDER FREE SMOOTH (Glove) ×1 IMPLANT
KIT INFECTION CONTROL CUSTOM (Kits) ×1 IMPLANT
KIT INFECTION CONTROL CUSTOM IFOH03 (Kits) ×1 IMPLANT
LEGGINGS SURGICAL L48 IN X W31 IN CUFF (Drape) ×1 IMPLANT
LEGGINGS SURGICAL L48 IN X W31 IN CUFF SMS L6 IN (Drape) ×1 IMPLANT
LIGHT RETRACTOR WIDE FLAT PHOTONGUIDE FIBEROPTIC (Procedure Accessories) IMPLANT
LIGHT RTRCTR W FLT PHOTONGUIDE LF STRL (Procedure Accessories) IMPLANT
MANIFOLD SUCTION 2 STANDARD 4 PORT (Filter) ×1 IMPLANT
MANIFOLD SUCTION 2 STANDARD 4 PORT NEPTUNE 2 WASTE MANAGEMENT SYSTEM (Filter) ×1 IMPLANT
MARKER SURGICAL MARGINMARKER 6 COLOR (Procedure Accessories) IMPLANT
NEEDLE  LOC BREAST 20GX5CM (Needle) ×2 IMPLANT
NEEDLE HPO PP RW BD 22GA 1.5IN LF STRL (Needles) ×2 IMPLANT
NEEDLE HYPODERMIC L1 1/2 IN OD22 GA REGULAR WALL BD POLYPROPYLENE (Needles) ×2 IMPLANT
NEEDLE LOCALIZATION L5 CM OD20 GA KOPANS EASY THREAD WIRE STIFFEN (Needle) ×1 IMPLANT
PACK SRG UNV ULTRAGARD LF (Pack) ×1 IMPLANT
PACK SURGICAL UNIVERSAL ULTRAGARD LATEX FREE (Pack) ×1 IMPLANT
PADDING CAST L4 YD X W6 IN UNDERCAST (Patient Supply) IMPLANT
PADDING CAST L4 YD X W6 IN UNDERCAST MEDLINE COTTON (Patient Supply) IMPLANT
PEN SURGICAL MARKING MEDLINE SKIN RULER (Positioning Supplies) ×1 IMPLANT
PEN SURGICAL MARKING SKIN RULER LARGE RESERVOIR REGULAR TIP LABEL (Positioning Supplies) ×1 IMPLANT
SPONGE GAUZE COTTON L4 IN X W4 IN 12 PLY WOVEN FOLD EDGE STERILE LATEX FREE (Dressing) IMPLANT
SPONGE GAUZE L4 IN X W4 IN 12 PLY WOVEN (Dressing) IMPLANT
SPONGE GAUZE L4 IN X W4 IN 12 PLY WOVEN FOLD EDGE XRAY DETECT COTTON (Dressing) IMPLANT
STRIP SKIN CLOSURE L4 IN X W1/2 IN (Dressing) ×2 IMPLANT
STRIP SKIN CLOSURE L4 IN X W1/2 IN REINFORCE STERI-STRIP POLYESTER (Dressing) ×1 IMPLANT
SUTURE COATED VICRYL 2-0 SH L27 IN BRAID (Suture) ×2 IMPLANT
SUTURE COATED VICRYL 2-0 SH L27 IN BRAID COATED VIOLET ABSORBABLE (Suture) ×1 IMPLANT
SUTURE COATED VICRYL PLUS 3-0 SH L27 IN (Suture) ×2 IMPLANT
SUTURE COATED VICRYL PLUS 3-0 SH L27 IN BRAID ANTIBACTERIAL COATED (Suture) ×1 IMPLANT
SUTURE MONOCRYL 4-0 PS-2 L18 IN (Suture) ×2 IMPLANT
SUTURE MONOCRYL 4-0 PS-2 L18 IN MONOFILAMENT UNDYED ABSORBABLE (Suture) IMPLANT
SUTURE MONOCRYL PLUS 4-0 PS-2 L27 IN (Suture) IMPLANT
SUTURE MONOCRYL PLUS 4-0 PS-2 L27 IN MONOFILAMENT ANTIBACTERIAL UNDYED (Suture) IMPLANT
SUTURE PDS II 3-0 SH L27 IN MONOFILAMENT (Suture) IMPLANT
SUTURE PDS II 3-0 SH L27 IN MONOFILAMENT UNDYED ABSORBABLE (Suture) IMPLANT
SUTURE SILK PERMA HAND BLACK 2-0 SH L30 (Suture) IMPLANT
SUTURE SILK PERMA HAND BLACK 2-0 SH L30 IN BRAID NONABSORBABLE (Suture) IMPLANT
SYRINGE 10 ML GRADUATE NONPYROGENIC DEHP (Syringes, Needles) ×2 IMPLANT
SYRINGE 10 ML GRADUATE NONPYROGENIC DEHP FREE PVC FREE LOK MEDICAL (Syringes, Needles) ×2 IMPLANT
TRAY SRG MAJOR PROC IFOH (Pack) ×1 IMPLANT
TRAY SURGICAL MAJOR (Pack) ×1 IMPLANT
TUBING SUCTION ID3/16 IN L10 FT (Tubing) IMPLANT
TUBING SUCTION ID3/16 IN L10 FT NONCONDUCTIVE STRAIGHT MALE FEMALE (Tubing) IMPLANT

## 2023-06-14 NOTE — Anesthesia Preprocedure Evaluation (Signed)
 Anesthesia Evaluation    AIRWAY    Mallampati: II         CARDIOVASCULAR    regular and normal       DENTAL                   PULMONARY    clear to auscultation     OTHER FINDINGS                                        Relevant Problems   ANESTHESIA   (+) PONV (postoperative nausea and vomiting)      CARDIO   (+) Aortic ectasia, thoracic   (+) Nonrheumatic aortic valve insufficiency   (+) Primary hypertension      ENDO   (+) Acquired hypothyroidism      Other   (+) Mass of right side of neck               Anesthesia Plan    ASA 2     MAC               (Risks discussed including but not limited to:    - Neurological complications such as stroke, TIA  - Cardiovascular complications such as heart attack, Arrhythmias, Cardiac arrest   - Pulmonary complications such as Asthmatic attack,Pulm. Aspiration, Bronchospasm and Pneumonia  - Intra-operative awareness,  - Dental Injuries  - Sore Throat.  - Allergic reactions.  - Death.     Anesthesia explained and Questions answered.     Pt understands and wishes to proceed.)      intravenous induction   Detailed anesthesia plan: general endotracheal, general IV, general LMA and general mask      Post Op: continuous oximetry    Post op pain management: per surgeon    informed consent obtained    Plan discussed with CRNA.    ECG reviewed  pertinent labs reviewed               Signed by: Unk Garb, MD 06/14/23 9:37 AM

## 2023-06-14 NOTE — H&P (Signed)
 [facility]  [address], [place], [place] [address]  [phone]    History and Physical Exam  Interval Note-No Changes      A complete H&P/Consult was performed on 06/04/2023.     The H&P was reviewed, the patient was examined, and I've confirmed that changes have not occurred in the patient's condition since the H&P was performed. Her repeat physical exam in the pre-operative area is unchanged.    Physical Exam: 06/14/2023  Vitals:    06/14/23 0940   BP: 127/84   Pulse: 80   Resp: 18   Temp: 97.9 F (36.6 C)   SpO2: 99%      Constitutional: Vitals reviewed and Well-developed, Well-noursished  Eyes: Normal conjunctiva and lids, Normal pupils    ENT: Normal appearance of external ears and nose, Normal nasal mucosa,  Normal lips, teeth and gums  Neck: Supple, no masses   Chest: Normal     Lymphatic:  No cervical, supraclavicular adenopathy present and No axillary adenopathy present  Respiratory: Normal effort, breath sounds  Cardiovascular: Normal Rate, regular rhythm and heart sounds, No edema, No thrills.   Normal Pedal pulses  Skin:  Warm, dry and intact, No rashes, lesions, or ulcers  Neurologic:  No deficits, normal gait  Psychiatric: Normal judgment and insight, Alert and oriented to person, place and time, Normal recent and remote memory and Normal mood and affect, no distress    Blake Bun, MD, FACS    See attached H&P below:       [facility]  [address], [place], [place] [address]  [phone]      Chief complaint : Discordant breast biopsy    History of Present Illness:     Ms Peggy Kennedy is a 43 y.o. female patient referred by  Shellye Dibble, MD for evaluation and management of a discordant breast biopsy. She has no breast complaints.  She denies breast masses, skin changes, new breast asymmetry, breast pain, nipple discharge, or nipple retraction.     The patient underwent a  bilateral screening mammogram with tomosynthesis at [facility] on 04/05/2023 that showed her breast tissue is heterogeneously dense and there were questionable calcifications in the upper outer left breast.  There was also a questionable asymmetry in the middle third of the left breast on the MLO view.  There were no concerning findings on the right.  A left diagnostic mammogram was performed on 04/12/2023 that showed a 1 cm span of grouped calcifications in the upper left breast with similar findings in the retroareolar breast.  A targeted left breast ultrasound was performed that showed an irregular shadowing mass at 2:00, 4 cm from the nipple measuring 9 x 7 x 5 mm.  Her axillary lymph nodes were normal in appearance.  Ultrasound-guided core needle biopsy of the left breast 2:00 mass was performed on 04/16/2023 with pathology showing benign breast tissue with variably dense stroma.  A HydroMARK open coil clip was placed in the appropriate position on the postbiopsy mammogram.  This was deemed discordant.  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  A HydroMARK butterfly clip was placed in an appropriate position on the postbiopsy mammogram.  Stereotactic core biopsy of the retroareolar lower calcifications was also performed that showed benign findings associated  with pseudoangiomatous stromal hyperplasia.  A HydroMARK barrel clip was placed in an appropriate position on the postbiopsy mammogram.  She underwent a breast MRI on 05/24/2023 that showed focal non-mass enhancement associated with the open coil biopsy clip at 2:00, 4 cm from the nipple measuring 9 x 7 x 8 mm with non-mass enhancement extending superiorly to the butterfly marker.  There is no suspicious enhancement at the barrel clip.  Her axillary lymph nodes were normal.    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    On enoxaparin 40 mg daily  On enoxaparin 40 mg daily    Hypertension 2023    Hypothyroid     Multiple benign melanocytic nevi 02/18/2016       Problem List:  [Problem List]    [Problem List]  Patient Active Problem List  Diagnosis    Acquired hypothyroidism    DDD (degenerative disc disease), lumbar    PCOS (polycystic ovarian syndrome)    Primary hypertension    Family history of coronary artery disease in father    Nonrheumatic aortic valve insufficiency    Aortic ectasia, thoracic    Mass of right side of neck    Subcutaneous mass of neck       Past Surgical History:  [Past Surgical History]     [Past Surgical History]  Procedure Laterality Date    ABDOMINAL SURGERY  08/2021    Gallbladder removal    CESAREAN SECTION  02/2017    CHOLECYSTECTOMY  08/2021    EXCISION, SOFT TISSUE Right 05/10/2023    Procedure: EXCISION OF POSTERIOR NECK MASS,RIGHT;  Surgeon: Brannon Calamity, MD;  Location: [facility] MAIN OR;  Service: General;  Laterality: Right;    LAPAROSCOPIC, CHOLECYSTECTOMY  08/24/2022    Dr. Scriven at [facility]       Medications:  [Current Medications]     [Current Medications]    Current Outpatient Medications:     drospirenone -ethinyl estradiol  (Syeda ) 3-0.03 MG per tablet, Take 1 tablet by mouth daily, Disp: 84 tablet, Rfl: 3    ergocalciferol  (ERGOCALCIFEROL ) 1.25 MG (50000 UT) capsule, Take 1 capsule (50,000 Units) by mouth once a week, Disp: 12 capsule, Rfl: 1    levothyroxine  (SYNTHROID ) 112 MCG tablet, Take 1 tablet (112 mcg) by mouth daily, Disp: 100 tablet, Rfl: 3    lisinopril  (ZESTRIL ) 10 MG tablet, Take 1 tablet (10 mg) by mouth daily, Disp: 100 tablet, Rfl: 3    Allergies:  [Allergies]     [Allergies]  No  Known Allergies    GYN History:    Gynecological History   Age of menarche:: 69           G:: 4     P:: 2   Hormone Replacement therapy:: No     Age at first delivery:: 79      Bra size:: 36C      Last mammogram:: 04/05/23   Marital status:: married     Nipple discharge:: No   Ethnicity:: caucasian           Past Family History:  [Family History]     [employer]  History]  Problem Relation Name Age of Onset    Hypertension Mother Father     Cholelithiasis Mother Father     Hypertension Father Millard Alliance     Cancer Father Millard Alliance 2        stomach cancer    Cholelithiasis Father Millard Alliance     Stroke Father Millard Alliance     Lung cancer Maternal Grandfather      Melanoma Paternal Grandfather          died of disease; met to liver    Breast cancer Paternal Aunt Aunt Sonia 87 - 60       Past Social History:   [Social History]    [Social History]  Socioeconomic History    Marital status: Married   Tobacco Use    Smoking status: Never     Passive exposure: Never    Smokeless tobacco: Never   Vaping Use    Vaping status: Never Used   Substance and Sexual Activity    Alcohol use: Not Currently     Comment: occassionally    Drug use: Never    Sexual activity: Yes     Partners: Male     Birth control/protection: Pill     Comment: Birth control     Social Drivers of Health     Financial Resource Strain: Low Risk  (05/10/2023)    Overall Financial Resource Strain (CARDIA)     Difficulty of Paying Living Expenses: Not very hard   Food Insecurity: No Food Insecurity (05/10/2023)    Hunger Vital Sign     Worried About Running Out of Food in the Last Year: Never true     Ran Out of Food in the Last Year: Never true   Transportation Needs: No Transportation Needs (05/10/2023)    PRAPARE - Therapist, art (Medical): No     Lack of Transportation (Non-Medical): No   Physical Activity: Insufficiently Active (05/10/2023)    Exercise Vital Sign     Days of Exercise per Week: 3 days     Minutes of Exercise per Session: 30 min    Stress: No Stress Concern Present (05/10/2023)    [employer] of Occupational Health - Occupational Stress Questionnaire     Feeling of Stress : Only a little   Social Connections: Socially Integrated (05/10/2023)    Social Connection and Isolation Panel [NHANES]     Frequency of Communication with Friends and Family: More than three times a week     Frequency of Social Gatherings with Friends and Family: More than three times a week     Attends Religious Services: More than 4 times per year     Active Member of [employer] or Organizations: Yes     Attends Engineer, structural: More than 4 times per year     Marital Status: Married   Catering manager Violence: Not At Risk (05/10/2023)    Humiliation, Afraid, Rape, and Kick questionnaire     Fear of Current or Ex-Partner: No     Emotionally Abused: No     Physically Abused: No     Sexually Abused: No   Housing Stability: Low Risk  (05/10/2023)    Housing Stability Vital Sign     Unable to Pay for Housing in the Last Year: No     Number of Times Moved in the Last Year: 0     Homeless in the Last Year: No       Physical  Exam   Physical Exam: 06/06/2023  Vitals:    06/04/23 1325   BP: 100/66   Pulse: 81   Resp: 16   SpO2: 100%      Constitutional: Vitals reviewed and Well-developed, Well-noursished  Eyes: Normal conjunctiva and lids, Normal pupils    ENT: Normal appearance of external ears and nose, Normal nasal mucosa,  Normal lips, teeth and gums  Neck: Supple, no masses   Chest: Normal     Lymphatic:  No cervical, supraclavicular adenopathy present and No axillary adenopathy present  Respiratory: Normal effort, breath sounds  Cardiovascular: Normal Rate, regular rhythm and heart sounds, No edema, No thrills.   Normal Pedal pulses  Abdominal:  No masses or tenderness, normal bowel sounds, No guarding, No hepatosplenomegaly  Musculoskeletal: Normal gait  No cyanosis,  Normal ROM with no pain  Skin:  Warm, dry and intact, No rashes, lesions, or  ulcers  Neurologic:  No deficits, normal gait  Psychiatric: Normal judgment and insight, Alert and oriented to person, place and time, Normal recent and remote memory and Normal mood and affect, no distress    Breasts:   Symmetry: symmetric  Scars: None  Right breast: Presence: Present            Skin:negative             Nipple:normal            Tissue: Within normal limits  Left breast: Presence:Present            Skin:negative             Nipple:normal            Tissue: Within normal limits    Radiology:      Assessment/Plan  1.left discordant breast biopsy    This is a 43 y.o. year old female patient who presented with an abnormal mammogram characterized by heterogeneously dense breast tissue, 2 groups of clustered calcifications and a hypoechoic shadowing mass in the left upper outer quadrant.  There is also MRI enhancement of the area.  Ultrasound-guided core needle biopsy showed benign findings but these are deemed to be discordant.  The findings associated with the 2 upper outer quadrant biopsy clips are suspicious based on imaging.  Thus I agree with the recommendation from radiology that this is discordant and a surgical excision would be recommended.    I would recommend a left intraoperative wire localized x2 excisional breast biopsy (periareolar incision, steri-strips ok). Target the 2-3 o'clock clips. We do not need to excise the benign tissue around the peri-areolar clip. I have explained the procedure, risks and benefits .  Potential complications include but are not limited to bleeding, infection, hematoma, chronic pain, chronic seroma, dissatisfaction with appearance of the breast or incision, recurrence of the mass, and need for additional surgery.  Most patients, however, experience only mild swelling , ecchymosis, or pain.  The surgery is an outpatient surgery.  You are discharged home the same day.  Pain is usually minimal.  I recommend the use of a supportive brassiere, and the use of  schedule Tylenol  and a nonsteroidal anti-inflammatory agent such as ibuprofen  or naproxen for 5 days.  A narcotic prescription for breakthrough pain can be prescribed.  You are expected to return to normal activity 2 weeks after surgery.      Answers submitted by the patient for this visit:  Patient Intake Form (Submitted on 05/21/2023)  Chief Complaint: Symptoms  Did you ever breast feed?: Yes  fever: No  chills: No  Night sweats: No  fatigue: No  Unexpected weight loss: No  Unexpected weight gain: No  Appetite change: No  Frequent thirst: No  Double vision: No  Eye discharge: No  eye pain: No  Eye redness: No  hearing loss: No  Ringing in ears: No  ear pain: No  congestion: No  Sinus pain: No  Nose bleeds: No  sore throat: No  trouble swallowing: No  Voice change: No  difficulty breathing: No  Pain with breathing: No  Chronic cough: No  Bloody sputum: No  wheezing: No  Snoring: No  chest pain: No  palpitations: No  leg swelling: No  Pain walking: No  leg pain: No  Pacemaker problems: No  Irregular heartbeat: No  nausea: No  vomiting: No  diarrhea: No  abdominal pain: No  Bloody stool: No  constipation: No  heartburn: No  Regurgitation: No  Cold intolerance: No  Heat intolerance: No  Hot flashes: No  Hair loss: No  Difficulty urinating: No  urinary pain: No  Urinary frequency: No  bladder incontinence: No  Blood in urine: No  vaginal bleeding: No  vaginal discharge: No  pelvic pain: No  arthralgias: No  back pain: Yes  flank pain: No  myalgias: No  neck pain: No  Neck stiffness: No  muscle weakness: No  itching: No  rash: No  Hives: No  Wound: No  Skin changes: No  dizziness: No  headaches: Yes  seizures: No  numbness: No  tingling: No  tremors: No  weakness: No  memory loss: No  confusion: No  speech difficulty: No  Bruise or bleeds easily: No  Lymph node problems: No  nervous/anxious: No  Depressed mood: No  Feeling agitated: No  Panic attack: No  Questionnaire about: Symptoms (Submitted on 05/21/2023)  Chief  Complaint: Symptoms

## 2023-06-14 NOTE — Anesthesia Postprocedure Evaluation (Signed)
 Anesthesia Post Evaluation    Patient: Peggy Kennedy    BIOPSY, LEFT BREAST, WIRE LOCALIZATION x2 (Left: Breast)    Anesthesia type: general    Last Vitals:   Vitals Value Taken Time   BP 120/70 06/14/23 12:10   Temp 36.8 C (98.2 F) 06/14/23 12:05   Pulse 96 06/14/23 12:10   Resp 16 06/14/23 12:10   SpO2 98 % 06/14/23 12:10                 Anesthesia Post Evaluation:     Patient Evaluated: PACU    Level of Consciousness: awake and alert  Pain Score: 0  Pain Management: adequate  Multimodal analgesia pain management approach  Strategies: NSAIDs and acetaminophen   Airway Patency: patent  Two or more mitigation strategies used for obstructive sleep apnea.  Strategies: intraop administration of CPAP/nasophargyneal airway/oral appliance, postop administration of CPAP/nasophargyneal airway/oral appliance and multimodal analgesia    Anesthetic complications: No      PONV Status: none    Cardiovascular status: stable  Respiratory status: acceptable  Hydration status: acceptable    Quality/Regulatory Reporting Exceptions  Medical reason(s) for not achieving 1 body temp measurement of 95.9 degrees F around anesthesia end: other (use comment)  Medical reason(s) for not administering 2+ classes of antiemetics: other (use comment)  Patient reason(s) for no neuraxial anesthesia or PNB: other (use comment)  Medical reason(s) for not screening for OSA or not using 2+ OSA mitigation strategies: other (use comment)        Signed by: Unk Garb, MD, 06/14/2023 12:33 PM

## 2023-06-14 NOTE — Discharge Instructions (Signed)
Post Anesthesia Discharge Instructions    Although you may be awake and alert in the recovery room, small amounts of anesthetic remain in your system for about 24 hours.  You may feel tired and sleepy during this time.      You are advised to go directly home from the hospital.    Plan to stay at home and rest for the remainder of the day.    It is advisable to have someone with you at home for 24 hours after surgery.    Do not operate a motor vehicle, or any mechanical or electrical equipment for the next 24 hours.      Be careful when you are walking around, you may become dizzy.  The effects of anesthesia and/or medications are still present and drowsiness may occur    Do not consume alcohol, tranquilizers, sleeping medications, or any other non prescribed medication for the remainder of the day.    Diet:  begin with liquids, progress your diet as tolerated or as directed by your surgeon.  Nausea and vomiting may occur in the next 24 hours.

## 2023-06-14 NOTE — Op Note (Signed)
 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  Lesion and butterfly clip retrieval confirmed on specimen radiograph of the initial lumpectomy.  Additional medial margin taken, which contained  the open coil clip.  Axillary sentinel lymph nodes identified and retrieved.    Indications:   This patient presents with history of a non-palpable but radiologically visible mass and calcifications with discordant core needle biopsies.  Surgical excision was recommended.  After review of the physical exam, pathology, and diagnostic imaging work up, the patient was offered a surgical excision. Attention to ensuring adequate margins during the resection was also important in the even a malignancy was identified. Risks and benefits were discussed and informed consent obtained. The patient understands that, pending final pathology in several business days, additional surgery may be recommended.    The preoperative antibiotic was ordered and administered within one hour of incision. Sequential compression devices were ordered and placed on the patient's lower extremities upon entering the operating room for venous thromboembolism prophylaxis.    Operative Notes:   The patient was seen in the PreOp Unit.  The site of surgery was  properly noted/marked.  The patient was then brought to the Operating Room. After induction of anesthesia, the patient's breast and axilla were prepped and draped in the normal sterile fashion.    An assistant at surgery was necessary to aid me in patient positioning, operative field retraction & exposure, aiding in incision closure and dressing application, any other operative tasks required for patient safety. There was no qualified resident available for this procedure.    A complete surgical time out was performed. I used the intraoperative ultrasound to find the lesion in question. The 5cm localization needle was placed under ultrasound guidance into the lesion and the hook was deployed. The needle was removed. Two views of the lesion showed the wire to be in place. The wire was trimmed.  The same procedure was performed to localize the second clip in the left upper outer quadrant.    The area of interest and planned incision were first marked out with a skin marker and then anesthetized with 1% lidocaine  mixed with 0.25% bupivacaine  with epinephrine .  Using a #15 blade, a periareolar skin incision was made.  The subcutaneous tissue was divided using cautery while attention was paid to ensure hemostasis.  Retractors were inserted for exposure and the dissection was carried out to the thick part of the wire which was then delivered into the wound with two hemostats.  One of the wires was noted to be lodged in the superficial tissue and had not fully deployed.  This wire was removed (more medial  wire).  The second wire was identified and introduced into the surgical field.  This was the more lateral wire.  Using the wire as a guide, dissection around the area of interest was carried out with cautery. When the specimen was removed from the breast, orientation sutures were placed with a silk suture: long lateral, short superior and double anterior.  The specimen was then passed off for a specimen radiograph using  the Faxitron.  This demonstrated retrieval of the butterfly clip and the wire.The specimen was sent to pathology.  Because the open coil clip was not in the specimen, an additional medial margin was taken.  The specimen radiograph identified the open coil clip within this specimen.  The new margin has been marked with a suture.  I used 2-0 vicryl sutures in an interrupted fashion to reapproximate the dense breast tissue so as to not leave a void.    The wound was closed with an interrupted 3-0 Vicryl suture in the deep dermal layer, and finally the skin was reapproximated with a running 4-0 Monocryl subcuticular closure. Steri-strips and a sterile dressing were applied. At the end of the operation, all sponge, instrument, and needle counts were correct.  The patient was then awaken from anesthesia, placed in a surgical bra, and taken to the PACU in stable condition.            Estimated Blood Loss:   * No values recorded between 06/14/2023 10:45 AM and 06/14/2023 12:12 PM *    Implants:   * No implants in log *       Drains:   Drains: no      Specimens:     ID Type Source Tests Collected by Time Destination   1 : Left 3:00 excised breast biopsy (short superior, long lateral, double anterior) Tissue Breast, Left SURGICAL PATHOLOGY Rosemary Conrad, MD 06/14/2023 1127    2 : Left medial margin, stitch marks margin Tissue Breast, Left SURGICAL PATHOLOGY Rosemary Conrad, MD 06/14/2023 1140          Complications:   None    Signed by: Rosemary Conrad, MD   MAIN OR

## 2023-06-16 LAB — SURGICAL PATHOLOGY

## 2023-06-17 ENCOUNTER — Ambulatory Visit (HOSPITAL_BASED_OUTPATIENT_CLINIC_OR_DEPARTMENT_OTHER): Payer: Self-pay

## 2023-06-29 NOTE — Progress Notes (Signed)
 [facility]  [address], [place], [place] [address]  [phone]  Breast Surgical Oncology Postoperative Visit    Reason for Clinic Visit:  Postoperative visit after surgery    HPI/Interim History:   Peggy Kennedy is a 43 y.o. female with left breast discordant biopsy after calcifications and enhancement were seen on her diagnostic imaging workup.  She was taken to the operating room on 06/14/2023 for a left breast intraoperative wire localized excisional biopsy.  She denies nausea, vomiting, fevers, chills, sweats, erythema at her incision site. She is still having mild tenderness deep to the incision.  She reports some bruising initially postoperatively.  This has resolved.    Other positive or negative symptoms are listed below.    Review of Systems:  A comprehensive review of systems was performed and all were negative     Medications:  Current medications  Current Medications[1]     Allergies:  Allergies[2]     Past Family History:  Family History[3]     Physical Exam: 06/30/2023  Vitals:    06/30/23 1006   BP: 118/76   Pulse: 66   Temp: 98.1 F (36.7 C)   SpO2: 92%      Constitutional: Vitals reviewed and Well-developed, Well-noursished  Lymphatic:  No cervical, supraclavicular adenopathy present and No axillary adenopathy present    Breasts:   Symmetry: symmetric  Scars: Left periareolar incision  Upon inspection of the breast the incision(s) appears well healed with no signs of seroma, hematoma or infection.    Pathology:   Final Diagnosis   A LEFT BREAST 3:00; EXCISION:  -BENIGN BREAST PARENCHYMA WITH FIBROCYSTIC CHANGE, COLUMNAR CELL CHANGE, USUAL DUCTAL HYPERPLASIA, PSEUDOANGIOMATOUS STROMAL HYPERPLASIA (PASH) LIKE FIBROSIS, AND CALCIFICATIONS.  - NEGATIVE FOR MALIGNANCY.  - PRIOR BIOPSY SITE CHANGES X 2.     B LEFT BREAST MEDIAL MARGIN; EXCISION:  - BENIGN BREAST PARENCHYMA WITH FIBROCYSTIC CHANGE, USUAL DUCTAL  HYPERPLASIA AND CALCIFICATION.  - NEGATIVE FOR MALIGNANCY.       Assessment:   Peggy Kennedy is a 43 y.o. female who was taken to the operating room on 06/14/2023 for a left breast excisional biopsy for discordant breast biopsy. Her surgical pathology shows benign changes without high risk atypia. This should not increase her lifetime risk of breast cancer. No further surgery is needed.  I recommend a follow-up 9-month mammogram given the benign findings on the discordant biopsy.  All of the diagnostic left breast mammogram and as needed ultrasound order was provided to the patient.    We also calculated her lifetime risk of breast cancer given her risk factors.  Per the Tyrer-Cuzick risk assessment score her lifetime risk of breast cancer is 30.5%. This is considered elevated risk for breast cancer.  We discussed high risk surveillance and risk reduction.        We discussed risk factors associated with the development of breast cancer. These can include age, female gender, having a family history of breast and/or ovarian cancer, obesity and postmenopausal weight gain, early menarche, late menopause, nulliparity or first pregnancy after 30, alcohol intake, and breast density.    1) Enhanced Surveillance: The patient should be seen in person every 6 months for a clinical breast exam. I will refer her to the [facility] for her high risk surveillance.  A referral was placed today and she was given a brochure.  Alternatively, she could be followed in the breast center by nurse practitioner. I also recommend increasing her breast imaging screening.  In addition to annual screening mammograms, she should consider annual contrast enhanced breast MRI.  I recommend separating these images by 6 months if possible so that she is getting breast imaging twice a year. Enhanced imaging surveillance is designed to detect a breast cancer early if it develops but does not  prevent breast cancer.   We did discuss the MRI does have increased sensitivity, decreased specificity leading to some false positives. Counseling was provided on the risks, benefits, and alternatives as well as financial considerations. Enhanced imaging surveillance is designed to detect a breast cancer early if it develops but does not prevent breast cancer.   We did discuss the MRI does have increased sensitivity, decreased specificity leading to some false positives.  Her breast imaging plan can be determined by the [facility].    2) Lifestyle Risk Reduction: The patient was encouraged to consider lifestyle changes to decrease her risk of breast cancer. We discussed lifestyle choices based on evidence-based recommendations from the [employer] and American Cancer Society on diet and exercise including a focus on consuming a plant-based diet made up of a variety of vegetables, fruit, beans, and whole grain carbohydrates, while limiting consumption of processed foods, red meat, sugar containing beverages, and alcohol (< drinks alcohol/day).  Additionally, she is encouraged exercise with a goal of at least 150 minutes per week of moderate physical activity. This should also include some weight lifting.  Nutritional counseling can be performed in the [facility].      3) Chemoprophylaxis: We briefly reviewed the role of risk reducing medications that block hormone receptors or the production of hormones and can decrease lifetime risk of breast cancer as a result. Specifically, discussed the risks and benefits of tamoxifen and raloxifene and indications for use in certain patient populations. Discussed the 49% reduction in invasive breast cancer incidence in high-risk women by both tamoxifen and raloxifene as was reported in the STAR trial (Study of Tamoxifen and Raloxifene) for high-risk postmenopausal women. We reviewed that tamoxifen is indicated for high risk premenopausal and  postmenopausal women, while raloxifene is only recommended for high risk postmenopausal women.     5) Surgical Risk Reduction: Prophylactic risk reducing bilateral mastectomy decreases risk by approximately 90-95% and prophylactic bilateral salpingo-oophorectomy decreases breast cancer risk by 25-50%, but are generally recommended for women with a known genetic pathogenic variant that significantly increases their lifetime risk for breast cancer.  Thus, at this point I do not recommend prophylactic surgery in this patient.      Plan:     Referral to the [facility]    She will be due for screening mammograms in February 2026 for surveillance.  Given the discordant biopsy and benign results, I do recommend a follow-up mammogram in 6 months to double check this area.  I provided her an order for a left diagnostic mammogram and as needed ultrasound.  We discussed that an MRI could be performed 6 months after her MRI given her elevated lifetime risk of breast cancer.  She will discuss further with the [facility].  Whole breast screening ultrasound could be done as well.    She can increase activity as tolerated.     Pain Management: As needed Tylenol  and ibuprofen     The patient was encouraged to consider lifestyle changes to decrease her risk of breast cancer. We discussed  lifestyle choices based on evidence-based recommendations from the [employer] and American Cancer Society on diet and exercise including a focus on consuming a plant-based diet made up of a variety of vegetables, fruit, beans, and whole grain carbohydrates, while limiting consumption of processed foods, red meat, sugar containing beverages, and alcohol (< drinks alcohol/day).  Additionally, she is encouraged exercise with a goal of at least 150 minutes per week of moderate physical activity. This should also include some weight lifting.      Onalee Steinbach C. Milton, MD, [facility]  Breast Surgical Oncologist  [facility]  T [phone]  F [phone]  [facility] Location:   [address], [place], [place] [address]  [facility] Location:   [address]  [place], [place] [address]    Disclaimer: Please pardon any potential grammatical errors or typos as aspects of this note may have been created through speech-to text software.         [1]   Current Outpatient Medications:     drospirenone -ethinyl estradiol  (Syeda ) 3-0.03 MG per tablet, Take 1 tablet by mouth daily, Disp: 84 tablet, Rfl: 3    ergocalciferol  (ERGOCALCIFEROL ) 1.25 MG (50000 UT) capsule, Take 1 capsule (50,000 Units) by mouth once a week, Disp: 12 capsule, Rfl: 1    levothyroxine  (SYNTHROID ) 112 MCG tablet, Take 1 tablet (112 mcg) by mouth daily, Disp: 100 tablet, Rfl: 3    lisinopril  (ZESTRIL ) 10 MG tablet, Take 1 tablet (10 mg) by mouth daily, Disp: 100 tablet, Rfl: 3  [2] No Known Allergies  [3]   Family History  Problem Relation Name Age of Onset    Hypertension Mother Father     Cholelithiasis Mother Father     Hypertension Father Richie     Cancer Father Richie         stomach cancer    Cholelithiasis Father Richie     Stroke Father Richie     Lung cancer Maternal Grandfather      Melanoma Paternal Grandfather          died of disease; met to liver    Breast cancer Paternal Aunt Wylie Ports

## 2023-06-30 ENCOUNTER — Ambulatory Visit (INDEPENDENT_AMBULATORY_CARE_PROVIDER_SITE_OTHER)

## 2023-06-30 ENCOUNTER — Encounter (HOSPITAL_BASED_OUTPATIENT_CLINIC_OR_DEPARTMENT_OTHER): Payer: Self-pay

## 2023-06-30 VITALS — BP 118/76 | HR 66 | Temp 98.1°F | Ht 67.0 in | Wt 130.0 lb

## 2023-06-30 DIAGNOSIS — Z9189 Other specified personal risk factors, not elsewhere classified: Secondary | ICD-10-CM

## 2023-06-30 DIAGNOSIS — R928 Other abnormal and inconclusive findings on diagnostic imaging of breast: Secondary | ICD-10-CM

## 2023-07-02 ENCOUNTER — Telehealth: Payer: Self-pay

## 2023-07-02 NOTE — Telephone Encounter (Signed)
 Patient reached out for an appointment, Dr.Turza is referring.  An appt for genetic and provider were made.      Medical Records    Genetic Testing: an appt was made  Annual Physical: In Epic  WWE/PAP: In Epic  Breast Imaging: In Epic  Colonoscopy: no

## 2023-07-11 ENCOUNTER — Encounter (HOSPITAL_BASED_OUTPATIENT_CLINIC_OR_DEPARTMENT_OTHER): Payer: Self-pay

## 2023-10-01 ENCOUNTER — Other Ambulatory Visit (FREE_STANDING_LABORATORY_FACILITY)

## 2023-10-01 DIAGNOSIS — E559 Vitamin D deficiency, unspecified: Secondary | ICD-10-CM

## 2023-10-01 DIAGNOSIS — E039 Hypothyroidism, unspecified: Secondary | ICD-10-CM

## 2023-10-01 LAB — VITAMIN D, 25 OH, TOTAL: Vitamin D 25-OH, Total: 32 ng/mL (ref 30–100)

## 2023-10-01 LAB — T4, FREE: T4 Free: 0.99 ng/dL (ref 0.69–1.48)

## 2023-10-01 LAB — THYROID STIMULATING HORMONE (TSH) WITH REFLEX TO FREE T4: TSH: 0.16 u[IU]/mL — ABNORMAL LOW (ref 0.35–4.94)

## 2023-10-06 ENCOUNTER — Ambulatory Visit (INDEPENDENT_AMBULATORY_CARE_PROVIDER_SITE_OTHER): Payer: Self-pay | Admitting: Internal Medicine

## 2023-10-08 NOTE — Progress Notes (Unsigned)
 SUMMARY  Peggy Kennedy Peggy Kennedy is a 43 y.o. female referred to the [facility] for a genetic counseling session regarding their family history of cancer. After discussion of the risks, benefits, and limitations of genetic testing, Peggy Kennedy Peggy Kennedy elected to pursue the 77-gene Cancer Next Expanded +RNA panel from [employer] [employer] I will call them with the results of their testing.      Time spent in discussion: 30 minutes    Reason for Referral  Peggy Kennedy is a 43 y.o. female who is referred by an [facility] provider for evaluation of a family history of cancer, for a discussion regarding genetic testing and for recommendations for ongoing cancer surveillance and prevention.     Medical History   Peggy Kennedy has a past medical history significant for a left breast excisional biopsy for discordant breast biopsy. Her surgical pathology shows benign changes without high risk atypia.     Peggy Kennedy's relevant gynecologic history is as follows:   G4P2  Menarche: 11  Menopause status: premenopausal  Age at first live birth: 14  OCPs: Yes  HRT: No  Hysterectomy: No  Oophorectomy: No, ovaries intact    Peggy Kennedy reports performing the following cancer screening:   Mammogram: Date: 04/09/2023, The breasts are heterogeneously dense  Pap Smear: Current  Colonoscopy: no previous colorectal cancer screening  TBSE: Follows regularly with outside dermatologist    Family History  Peggy Kennedy family history is significant for the following:          Assessment   Peggy Kennedy's family history warrants consideration of an inherited susceptibility to cancer due to the constellation of cancers seen in the family.    Alternatively, the family history may indicate the presence of a  familial predisposition that is not due to a known high-penetrance susceptibility gene.     Peggy Kennedy appears to be a candidate for genetic testing for hereditary cancer risk, given that they appear to meet appropriate NCCN testing criteria. A multi-gene panel is recommended to assess the potential for Peggy Kennedy Peggy Kennedy to carry a mutation in one of several known cancer susceptibility genes. The identification of a mutation would guide the most appropriate cancer screening and risk reduction recommendations for Peggy Kennedy Lela Murfin and their family members.     Content of Discussion  We discussed the difference between sporadic versus hereditary cancers and the relative frequencies of these.      We reviewed the availability of multi-gene panels and the varying levels of cancer risk associated with different genes. Broadly, we consider some genes to be high risk while others cause a more moderate level of risk.  Lastly, there are some newly described genes for which the level of cancer risk is not yet well understood.    We reviewed possible genetic test results, including positive, negative and inconclusive. Much of our discussion focused on the medical implications of a positive genetic test result, including risk(s) for possibly more than one type of cancer and the options for management of increased cancer risks including elevated surveillance, risk-reducing surgery and chemoprevention.  I explained that effective screening and/or risk-reducing options may not exist for all of the increased cancer risks associated with the hereditary cancer susceptibility genes.     Peggy Kennedy Peggy Kennedy  Peggy Kennedy understood the limitations in some of the currently available data, that recommendations for cancer screening and/or risk reduction are tailored to both the genetic test result and the personal/family history, and that their age and other factors  need to be taken into account when trying to determine the degree of benefit that they may derive from risk-reducing surgery.     We discussed the fact that negative test results are of limited value unless a mutation has already been identified in the family. There is a significant population risk for cancer, and there may be unidentified genes responsible for the observed personal and family history.  Increased cancer surveillance (or prevention interventions) may be appropriate even after negative results, based upon empiric estimates of risk. There is a chance that a mutation in a particular cancer susceptibility gene may not be detected, even if one is present, due to either technical limitations or laboratory error.     I also explained that the test result may identify a variant of uncertain significance.  These variants may or may not be associated with an increased cancer risk, and the optimal management of families transmitting such variants has not been defined.  The majority of variants of uncertain significance are ultimately reclassified as benign (not associated with an increased cancer risk).  Screening and prevention recommendations are therefore based on someone's personal and family histories.    We specifically discussed the Cancer Next Expanded +RNA panel from [facility].  With a panel there is an approximately 25% likelihood of finding a variant of unknown significance in one or more of the genes included.      We discussed the fact that genetic testing would be recommended for their immediate relatives if their test result is positive. In this case, each of them would have a 50% likelihood of sharing the mutation. In most cases, genetic testing for cancer susceptibility is not recommended for individuals under age 6 years unless the result will alter their medical management before this age. Family members who choose not to be tested may be able to infer their mutation status from  that of others tested.  If Peggy Kennedy's test result is negative or inconclusive, I may recommend that their relatives consider pursuing genetic testing.      I also discussed our policies for documentation of genetic test results in the medical record and the theoretical possibility of employment and insurance discrimination based on genetic test results.    Psychosocial Considerations  Genetic testing may have significant psychological implications for both individuals and families.  During the session, Hayle Kennedy Zoi Devine demonstrated understanding of the material presented. They raised appropriate questions, which were answered to the best of my ability.    Plan for Genetic Testing  Rhesa Kennedy Shanica Castellanos is aware that genetic testing may indicate an increased risk for cancers unrelated to their personal and/or family history. Jamiracle Kennedy Sonal Dorwart indicated intention to follow medical management guidelines provided on the basis of genetic testing and/or their personal/family history, as appropriate. Amberlynn Kennedy Saje Gallop reports intention to share the results of genetic testing and relevant management guidelines with their relatives as recommended.      After this discussion, Enzley Kitchens demonstrated appropriate understanding of the material covered and elected to pursue genetic testing.    Wafa Kennedy Imogene signed the appropriate consent document(s) and provided a blood sample for genetic testing, which will be  sent to the genetic testing laboratory. We will submit the test requisition form to [facility] Peggy Sheerer Habenicht's referring provider for their review and signature. We will discuss the results over the phone in ~4 weeks and then [facility] Peggy Sheerer Crete will have a follow-up appointment with a [facility] provider to complete their risk assessment. The  patient is aware that the follow up appointment is an important component of their over-all risk assessment and necessary to provide final surveillance and risk-reduction recommendations. Kairee Kennedy Ramsey expressed understanding of and agreement to this plan.    The specific test ordered is Cancer Next Expanded +RNA.  Expected turn-around-time for the test is ~4 weeks.     The insurance and billing process was reviewed.     Screening Recommendations and Plan for Follow-up  I will discuss screening and prevention recommendations when genetic test results are available to inform that discussion.     Conclusion   It was a pleasure to meet with Peggy Katheren Peggy Sheerer Crete and I remain available for any additional questions that arise while we wait for genetic test results. I will contact Peggy Kennedy Peggy Sheerer Crete by phone when their results are available.  As has been discussed with the patient, if they are unavailable and can't be reached on multiple occasions, I will disclose the result directly to the referring physician.    Rexene Kayser, MS, [facility]  Senior Tax adviser [facility]   [facility]  [address]  [place], [place] [address]  [place] [phone]

## 2023-10-12 ENCOUNTER — Ambulatory Visit: Payer: Self-pay

## 2023-10-12 DIAGNOSIS — Z803 Family history of malignant neoplasm of breast: Secondary | ICD-10-CM

## 2023-10-12 DIAGNOSIS — Z9189 Other specified personal risk factors, not elsewhere classified: Secondary | ICD-10-CM

## 2023-10-12 DIAGNOSIS — Z800 Family history of malignant neoplasm of digestive organs: Secondary | ICD-10-CM

## 2023-10-15 NOTE — Progress Notes (Signed)
 Pt hasn't viewed/responded to mychart result message. Please call and discuss with pt.

## 2023-11-06 NOTE — Progress Notes (Unsigned)
 GENETIC COUNSELING SUMMARY  Genetic test performed:   [facility]-Expanded + RNAinsight, 77 genes    Results:   NEGATIVE    Recommendations:   Reem Arantes Feliciana Narayan is considered at elevated risk for {Cancer types:70687} cancer and it is recommended they consult with a specialist to discuss this elevated risk and family history, as well as recommendations for increased cancer screening.     Jennice Arantes Linnea Todisco should continue to follow general population screening guidelines for all other cancers.    Additional genetic testing can be considered for Consepcion Breech Ervin Everitt Mcjunkins's relatives. The best candidate(s) for genetic testing in the family are her paternal first cousins and siblings.     For a complete summary and detailed recommendations, see below.       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 Her surgical pathology shows benign changes without high risk atypia.      Consepcion Arantes Ervin Everitt Vernon's relevant gynecologic history is as follows:   G4P2  Menarche: 11  Menopause status: premenopausal  Age at first live birth: 51  OCPs: Yes  HRT: No  Hysterectomy: No  Oophorectomy: No, ovaries intact     [place] Arantes Maysel Mccolm reports performing the following cancer screening:   Mammogram: Date: 04/09/2023, The breasts are heterogeneously dense  Pap Smear: Current  Colonoscopy: no previous colorectal cancer screening  TBSE: Follows regularly with outside dermatologist    FAMILY HISTORY:      TEST PERFORMED:  Consepcion Breech Dinara Lupu pursued a 69 gene panel of cancer susceptibility genes through [employer]     DNA sequencing can identify mutations that are typically small in size and deletion/duplication testing can identify larger, rarer mutations. This combination of techniques is capable of detecting hundreds of  possible mutations within these genes. I contacted Consepcion Breech Ervin Everitt Crete to review the results of this test by phone. We spoke on ***    TEST RESULTS:  The results were negative, meaning that no mutations were identified in any of the genes analyzed. Further, no variants of unknown significance were identified.         RESULT INTERPRETATION:  The interpretation of Malaina Arantes Ervin Everitt Vanderkolk's genetic test results is provided within the context of her reported personal and/or family history and based on current scientific understanding.      This negative genetic test result significantly reduces the likelihood that [place] Arantes Kamelia Lampkins has inherited an increased risk for developing cancer. However, this negative result unfortunately does not completely rule out this possibility:    1. There could be a mutation in one of the genes that the test did not detect, although the likelihood of this is low;  2. There could be a mutation in a gene that was not analyzed, either known or unknown;  3. It is possible that there is a hereditary risk present in other relatives that [place] Arantes Ervin Everitt Crete did not inherit.  Genetic testing can be considered for relatives at this time - please see below for more information;  4. Lastly, Consepcion Breech Ervin Everitt Hata's family history of cancer may have developed due to a combination of genetic and other environmental factors that cannot be specifically defined at this time.      Despite their negative genetic test result, [place] Arantes Dannie Woolen may still  be at an increased risk for cancer as the family history of cancer remains unexplained.     While the likelihood is extremely small, there is always the possibility of human or laboratory error in performing a genetic test. The laboratories we utilize take every effort to maximize the accuracy of the tests performed. Since genetic test results have important  implications for medical management, patients may wish to consider having the test repeated by submitting a fresh sample. Repeat testing may be particularly useful for unaffected individuals who are considering risk-reducing surgery based on positive results, or for individuals who test negative for a known mutation in the family.     Some individuals have different genetic variants within or between tissues (i.e., blood/saliva versus skin). The test may not detect genetic variants that are only present in some cells or tissues.    CANCER RISKS, SCREENING AND/OR PREVENTION RECOMMENDATIONS for Consepcion Katheren Ervin Everitt Marten  [place] Arantes Ervin Appollonia Klee is aware that, even though a mutation was not identified on her genetic test, she may still be at increased risk for developing cancer for the reasons noted above. At this time we recommend that cancer screening and prevention be based on Devonna Arantes Barbosa De Heino's personal and/or family history of cancer.      Artelia Arantes Blannie Shedlock should review these recommendations in detail with her physicians to finalize a cancer screening plan, including the risks, benefits and limitations of each screening method.  All recommendations should be considered with regard to her personal history and current health. Recommendations may change as personal and/or family histories of cancer change, as additional genetic test results become available for [place] Arantes Ervin Everitt Marten or relative(s) or as medical science advances. It is very important to note that surveillance (or cancer screening), no matter how well or carefully performed, does not guarantee that cancer will be detected at a stage that is curable or that will require minimal treatment.     BREAST: A woman's risk for developing breast cancer depends on several factors, including her family history, current age, menopausal status and other reproductive factors, and certain  lifestyle choices such as tobacco and alcohol use.     Zelina Arantes Ervin Everitt Gautier's genetic test result does not clearly suggest that she has inherited an increased risk for developing a primary breast cancer. I encouraged Consepcion Katheren Kenlei Safi to discuss her estimated risk for breast cancer based on other factors with her physicians.    When we are unable to identify a genetic cause for breast cancer in the family, we utilize computer models to help risk stratify unaffected individuals. The IBIS / Tyrer-Cuzick risk assessment model estimates that [place] Arantes Analyssa Downs has a:  1.47% risk of developing breast cancer by age 20 (in the next five years), and a  16.30% risk of developing breast cancer by age 20 (lifetime risk).     It is very important to keep in mind that this model (and others) gives approximate, rather than precise, estimates of breast cancer risk based on different risk factors and that each uses different data sets. The Tyrer-Cuzick model takes into account family history of breast cancer in close relatives, current age, biopsy history, BRCA1/2 test result, childbirth history, height and weight, use of hormone replacement therapy, age at menarche, and menopause status. It is also important to note that Tyrer-Cuzick was developed and validated largely in non-Hispanic Caucasian women, and additional data is needed in  more diverse populations to provide more accurate breast cancer risk estimations.     Additionally, this breast cancer risk estimation may change over time as certain risk factors evolve (such as parity, menopausal status, height and weight) and therefore the patient is encouraged to review any pertinent changes with her healthcare providers to determine whether this risk estimation should be re-calculated in the future.      It is important that [place] follows the screening regimen determined by her physicians, which  may include annual clinical encounter (including ongoing risk assessment, risk reduction counseling, and preferably a clinical breast exam even in asymptomatic individuals when feasible), annual screening mammogram with tomosynthesis, as well as breast awareness. Individuals should be familiar with their breasts and promptly report changes to their health care provider. Known breast density should be factored into risk assessment if available from a prior mammogram.     Increased breast cancer screening may be a useful tool in women with elevated risk for breast cancer. Per the NCCN guidelines (V2.2025), for women who have a residual lifetime breast cancer risk >20% as defined by models that include a comprehensive family history (including the IBIS/Tyrer-Cuzick model), they recommend annual breast MRI with and without contrast as an adjunct to annual screening mammogram with tomosynthesis.     As per NCCN guidelines, individuals whose residual lifetime breast cancer risk is between 15%-20% as defined by models that include a comprehensive family history Rando Arantes Ervin Sheerer Goetze's is 16.30%) may be considered for supplemental screening on an individual basis, depending on risk factors. Therefore, the patient can discuss with her physician(s) the benefits and limitations of increased breast cancer screening. We can also provide a referral to a specialist as needed.      OVARIES:  Nycole Kawahara Eliot's genetic test results and a reported lack of a family history of ovarian cancer indicate that her lifetime risk for ovarian cancer is expected to be the same as the general population risk of 1.3%.  This is based on studies that have followed women with personal and/or family histories of breast cancer, no family history of ovarian cancer and negative BRCA1 and BRCA2 genetic test results.    COLON: The general population (average) lifetime risk for colorectal cancer is approximately ~4.5%.  Studies have shown that a family history of colorectal cancer increases a person's risk.  The number of affected relatives, their degree of relationship and their age(s) at diagnosis should all be considered.    <<Affected FDR>>  Overall, the lifetime colorectal cancer risk in people who have one affected first degree relative appears to be about two times the average person's risk, or about 10%.      <<Two Affected FDR>>  Overall, the lifetime colorectal cancer risk in people who have two affected first degree relatives appears to be about three times the average person's risk, or about 15%.    <<Affected FDR under age 20>>  However, the risk appears to be higher (approximately 3.3 times the average person's risk) in people whose affected first degree relative was diagnosed with colorectal cancer under the age of 2. This corresponds to a ~16-17% lifetime risk to develop colon cancer.     <<One Affected FDR and Two Affected SDR>>  Overall, the lifetime colorectal cancer risk in people who have one first degree relative and two second degree relatives appears to be about 2.3 times the average person's risk.  This corresponds to a ~11-12% lifetime risk  to develop colon cancer.    {GC screen colon:70768}    SKIN:  Because close relatives of individuals with melanoma are at increased risk of developing melanoma, I recommended that [place] Arantes Ervin Everitt Crete practice sun avoidance and undergo total body skin examination yearly, or more frequently at the recommendation of the treating dermatologist.    LIFESTYLE MODIFIERS: In addition to family history, lifestyle factors may also influence cancer risk. These include diet, exercise and exposure to tobacco and alcohol. It is therefore important that [place] maintain a well-balanced diet, try to exercise on a regular basis, limit alcohol consumption, and also avoid tobacco use.      ADDITIONAL GENETIC TESTING RECOMMENDATIONS FOR  [place] Arantes Ervin Everitt Hildebrandt:  Sunol Arantes Emberli Ballester is not a clear candidate for additional genetic testing at this time given that she received an expanded multi-gene panel. However, she will remain a reasonable candidate for additional testing in the future as more cancer predisposition genes are identified.    Genetic testing can be considered for Consepcion Katheren Ervin Everitt Julio's relative(s) - please see below.    INFORMATION FOR Consepcion Arantes Ervin Everitt Jankowski'S RELATIVES:    Based on this test result, Seidy Arantes Minka Knight is not expected to have passed down a detectable mutation in any of the genes analyzed to her child/ren.      ***IF ADDITIONAL TESTING RECOMMENDED IN THE FAMILY Given remaining concern about possible hereditary risk as noted above, I recommend consideration of additional genetic testing for Consepcion Katheren Ervin Everitt Kramm's relatives. The best candidate(s) for genetic testing in the family ***.  The rationale for this would be to evaluate the possibility of a definable gene mutation present in other relatives that [place] Arantes Leaann Nevils has not inherited.  Identification of a mutation in a relative would not only provide useful information for that particular relative (and others), it may allow us  to reinterpret Consepcion Katheren Ervin Everitt Adrian's negative genetic test result with significantly more reassurance than is appropriate at this time.    ***IF ADDITIONAL TESTING NOT INDICATED Genetic testing is not indicated for any of Ailana Arantes Ervin Everitt Shafran's relatives at this time based on the reported family history.      All of [place] Arantes Ervin Everitt Hornbaker's relatives should pursue cancer screening as recommended by their physicians based on their personal histories and - at a minimum - the general population recommendations.  I further recommend that they review the family history of cancer, [place]  Arantes Ervin Everitt Pence's genetic test results and have a discussion with their physicians about whether additional increased cancer screening and/or prevention is recommended. Relatives may benefit from individualized genetic counseling to discuss personalized screening recommendations.      FOLLOW UP:  Although Consepcion Katheren Ervin Everitt Langland's test results were negative, we discussed the fact that it is normal to still have questions as to why she and/or her family members may have developed cancer.  If she and/or her physicians have other questions about her genetic testing and the material that was discussed we would be more than happy to have additional conversation(s).    Because information about cancer genetics is constantly changing and evolving, I encouraged Abbagale Arantes Laquashia Mergenthaler to contact me via phone or e-mail every 1-2 years to determine if there may be any new major updates or advances that are relevant for her. Ideally, she should also make us  aware  of any changes in her address or phone number so we can reach her in the future.  Jametta Arantes Rayssa Atha should let us  know if there are any changes to her personal and/or family histories, including if anyone else in the family pursues genetic testing for hereditary cancer risk (no matter what the result) as these could influence the current risk assessment.  As noted above, genetic testing *** recommended for relative(s).    A copy of the test results will be provided electronically to [facility] and will also be scanned into her [place] electronic medical record as discussed. Copies can be sent to non-[place] physician(s) as requested.      It was a pleasure to provide genetic counseling to [place] Arantes [employer].  I look forward to staying in touch in the future and I remain available for any questions that arise.    Consepcion Arantes Kevin Mario  has a follow-up  appointment scheduled:  Future Appointments   Date Time Provider Department Center   11/15/2023  9:00 AM Hardy, Delon RAMAN, FNP [facility]   05/22/2024  8:30 AM Lender, Eva RAMAN, MD [facility]     It is important for her to keep this appointment to receive her personalized care plan.      Rexene Kayser, MS, [facility]  Senior Tax adviser [facility]   [facility]  [address]  [place], [place] [address]  [place] [phone]

## 2023-11-07 ENCOUNTER — Other Ambulatory Visit (INDEPENDENT_AMBULATORY_CARE_PROVIDER_SITE_OTHER): Payer: Self-pay | Admitting: Internal Medicine

## 2023-11-07 DIAGNOSIS — E559 Vitamin D deficiency, unspecified: Secondary | ICD-10-CM

## 2023-11-09 NOTE — Telephone Encounter (Signed)
 The prescription is repletion dosing. Vit D now repleted. Pt should transition to maintenance dosing of 2000 units per day.

## 2023-11-11 NOTE — Progress Notes (Signed)
 Peggy [facility] CANCER RISK ASSESSMENT      Dear Dr.     I had the pleasure of seeing your patient in our department today for a cancer risk assessment. Below, you will find her initial consultation note.  If you have any questions, please feel free to contact me.  Again, thank you for allowing me to participate in this patient's care.       History of Present Illness:     History of Present Illness  Peggy Kennedy is a 43 year old female with a family history of cancer who presents for a comprehensive cancer risk assessment.     She underwent genetic testing due to her family history and recent breast biopsies. She was found to be negative for the genes analyzed.     She has a family history of her father diagnosed with stomach cancer age 37 yo, paternal aunt age 54 and paternal grandfather with melanoma age 22, died age 78. Her father's family worked on a farm in [place]. Her maternal grandfather was diagnosed with lung cancer age 67. No one in her family has had genetic testing.     She had a routine screening mammogram on 04/05/2023 which revealed heterogeneously dense breasts, questioned grouped left breast calcifications and asymmetry which further evaluation is recommended. She had a follow up mammogram on 04/12/2023 which revealed suspicious left 2:00 shadowing mass, recommending biopsy, indeterminant calcifications in the left breast recommending 2 site biopsy. She underwent left breast ultrasound guided biopsy on 04/16/2023 pathology was benign and a stereotactic left breast biopsy on 04/21/2023 which was benign.  She was found to have left breast discordance in her biopsy results, MRI was recommended. She had a breast MRI on 05/24/2023 which revealed a focal non mass enhancement in the left breast confirming discordance, recommending surgical excision. She was had a left breast excision by Dr. Milton on 06/14/2023 pathology which was benign. She reports occasional left breast pain at the  excision site. She denies feeling any palpable masses, areas of thickening, skin changes or nipple discharge.      She has a history of Hashimoto's thyroiditis for which she has been taking Synthroid  for 15 years. She also has a history of high blood pressure diagnosed 18 months ago, which is well-controlled with medication, diet, and exercise. She has lower back pain of which she is currently being evaluated for, she had a lumbar MRI on 10/03/205.     She had her gallbladder removed in July 2024 but reports recent symptoms of nausea, vomiting, and pain, and gall stones seen on her recent MRI.          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 She reports no dietary restrictions, consuming vegetables daily and red meat infrequently. She does not consume alcohol or tobacco. Due to her back issues, she has not been exercising recently.  Physical Activity: Insufficiently Active (10/07/2023)    Exercise Vital Sign     Days of Exercise per Week: 2 days     Minutes  of Exercise per Session: 30 min   Limited by back   Alcohol Use: Not At Risk (10/07/2023)    AUDIT-C     Frequency of Alcohol Consumption: Never     Average Number of Drinks: Patient does not drink     Frequency of Binge Drinking: Never     Tobacco Use: Low Risk (06/14/2023)    Patient History     Smoking Tobacco Use: Never     Smokeless Tobacco Use: Never     Passive Exposure: Never         PROVIDERS:  PCP: Peggy Eva RAMAN, MD   Gynecologist: Peggy Kennedy     No Known Allergies  Outpatient Medications Marked as Taking for the 11/15/23 encounter (Office Visit) with Peggy Peggy RAMAN, [facility]   Medication Sig Dispense  Refill    drospirenone -ethinyl estradiol  (Syeda ) 3-0.03 MG per tablet Take 1 tablet by mouth daily 84 tablet 3    levothyroxine  (SYNTHROID ) 112 MCG tablet Take 1 tablet (112 mcg) by mouth daily 100 tablet 3    lisinopril  (ZESTRIL ) 10 MG tablet Take 1 tablet (10 mg) by mouth daily 100 tablet 3    valACYclovir  (VALTREX ) 1000 MG tablet Take by mouth as needed       Past Medical History:   Diagnosis Date    Back pain     chronic for many years    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    during pregnancies- On enoxaparin 40 mg daily    Hypertension 2023    controlled on medication    Hypothyroid 2010    Hashimoto's    Multiple benign melanocytic nevi 02/18/2016    Post-operative nausea and vomiting     with gallbladder     Past Surgical History:   Procedure Laterality Date    ABDOMINAL SURGERY  08/2021    Gallbladder removal    BIOPSY, BREAST, WIRE LOCALIZATION  Left 06/14/2023    Procedure: BIOPSY, LEFT BREAST, WIRE LOCALIZATION x2;  Surgeon: [facility] Peggy BROCKS, MD;  [facility];  Service: General;  Laterality: Left;    CESAREAN SECTION  02/2017    CHOLECYSTECTOMY  08/2021    EXCISION, SOFT TISSUE Right 05/10/2023    Procedure: EXCISION OF POSTERIOR NECK MASS,RIGHT;  Surgeon: Peggy Kennedy Peggy CROME, MD;  Location: [facility];  Service: General;  Laterality: Right;    LAPAROSCOPIC, CHOLECYSTECTOMY  08/24/2022    [facility] at [facility]     Family History   Problem Relation Name Age of Onset    Hypertension Mother Father     Cholelithiasis Mother Father     Hypertension Father Peggy Kennedy     Cancer Father Peggy [facility]         stomach cancer    Cholelithiasis Father Peggy Kennedy     Stroke Father Peggy [facility]     Lung cancer Maternal Grandfather      Melanoma Paternal Grandfather          died of disease; met to liver    Breast cancer Paternal Aunt Aunt Peggy Kennedy      Social History     Tobacco Use    Smoking status: Never     Passive exposure: Never    Smokeless tobacco: Never   Vaping Use    Vaping status: Never Used    Substance Use Topics    Alcohol use: Not Currently     Comment: occassionally    Drug use: Never       ROS:  Review of Systems   Constitutional:  Negative for appetite change, chills, fatigue and fever.   HENT:  Negative for congestion, ear pain, hearing loss, nosebleeds, sinus pain, sore throat, tinnitus, trouble swallowing and voice change.    Eyes:  Negative for pain, discharge and redness.   Respiratory:  Negative for wheezing.    Cardiovascular:  Negative for chest pain, palpitations and leg swelling.   Gastrointestinal:  Negative for abdominal pain, blood in stool, constipation, diarrhea, nausea and vomiting.   Endocrine: Positive for cold intolerance. Negative for heat intolerance.   Genitourinary:  Positive for flank pain. Negative for difficulty urinating, dysuria, hematuria, pelvic pain and vaginal discharge.   Musculoskeletal:  Positive for back pain and myalgias. Negative for arthralgias, neck pain and neck stiffness.   Skin:  Negative for rash and wound.   Neurological:  Negative for dizziness, tremors, seizures, speech difficulty, weakness, numbness and headaches.   Hematological:  Negative for adenopathy.   Psychiatric/Behavioral:  Negative for agitation and confusion. The patient is not nervous/anxious.        Objective:     Vitals:    11/15/23 0846   BP: 106/69   Pulse: 76   Temp: 98 F (36.7 C)   SpO2: 100%     Physical Exam  Constitutional:       Appearance: Normal appearance. She is normal weight.   HENT:      Mouth/Throat:      Mouth: Mucous membranes are moist.   Cardiovascular:      Rate and Rhythm: Normal rate and regular rhythm.      Pulses: Normal pulses.      Heart sounds: Normal heart sounds.   Pulmonary:      Effort: Pulmonary effort is normal.      Breath sounds: Normal breath sounds.   Chest:   Breasts:     Breasts are symmetrical.      Right: No inverted nipple, mass, nipple discharge, skin change or tenderness.      Left: Tenderness present. No inverted nipple, mass, nipple  discharge or skin change.          Comments: Left Breast Dense and tender - upper outer quadrant   Abdominal:      General: Abdomen is flat. There is no distension.      Palpations: Abdomen is soft.      Tenderness: There is no abdominal tenderness. There is no guarding.   Lymphadenopathy:      Head:      Right side of head: No submental, submandibular or posterior auricular adenopathy.      Left side of head: No submental, submandibular or posterior auricular adenopathy.      Cervical: No cervical adenopathy.      Upper Body:      Right upper body: No axillary adenopathy.      Left upper body: No axillary adenopathy.   Skin:     General: Skin is warm.   Neurological:      General: No focal deficit present.      Mental Status: She is alert and oriented to person, place, and time.   Psychiatric:         Mood and Affect: Mood normal.         Behavior: Behavior normal.         Thought Content: Thought content normal.         Judgment: Judgment normal.         LAB DATA:  Reviewed labs available in Epic     PATH:    06/14/2023   Final Diagnosis   A LEFT BREAST 3:00; EXCISION:  -BENIGN BREAST PARENCHYMA WITH FIBROCYSTIC CHANGE, COLUMNAR CELL CHANGE, USUAL DUCTAL HYPERPLASIA, PSEUDOANGIOMATOUS STROMAL HYPERPLASIA (PASH) LIKE FIBROSIS, AND CALCIFICATIONS.  - NEGATIVE FOR MALIGNANCY.  - PRIOR BIOPSY SITE CHANGES X 2.     B LEFT BREAST MEDIAL MARGIN; EXCISION:  - BENIGN BREAST PARENCHYMA WITH FIBROCYSTIC CHANGE, USUAL DUCTAL HYPERPLASIA AND CALCIFICATION.  - NEGATIVE FOR MALIGNANCY.                  RADIOLOGY DATA:     05/24/2023 Bilateral Breast MRI  I1. Focal non mass enhancement in the left breast at 2:00 associated with  the open coil biopsy marker and extending superiorly to the butterfly  biopsy marker. Finding represents known benign discordant biopsy site.  Given enhancement, breast surgical excision is recommended.     2. No additional suspicious enhancement in either breast.    04/12/2023 Left Mammogram Follow  up  IMPRESSION:   1. Suspicious left 2:00 shadowing sonographic mass. Recommend  ultrasound-guided needle core biopsy.  2. Indeterminant calcifications in the left retroareolar and upper outer  mid depth breast. Recommend 2 site stereotactic biopsy for further  assessment.    02/24/205 Bilateral Screening Mammogram  Density: Heterogeneously dense  Impression:   Questioned grouped left breast calcifications and asymmetry for which  further evaluation is recommended. We will recall the patient for  additional evaluation and, at that time, a final recommendation regarding  management will be made.        Assessment and Plan:   1. Family history of breast cancer  - Mammo Diagnostic w/Tomo Left; Future  - Mammo Diagnostic w/Tomo Bilat; Future    2. Mass of left breast, unspecified quadrant  - Mammo Diagnostic w/Tomo Left; Future  - Mammo Diagnostic w/Tomo Bilat; Future    3. Family history of stomach cancer    4. Family history of melanoma      Ms. Peggy Kennedy underwent genetic counseling by Rexene Kayser, [facility]. She elected to pursue  a 77 gene panel of cancer susceptibility genes through [employer] The results were negative, meaning that no mutations were identified in any of the genes analyzed. Further, no variants of unknown significance were identified.  We discussed the following.      BREAST: A woman's risk for developing breast cancer depends on several factors, including her family history, current age, menopausal status and other reproductive factors, and certain lifestyle choices such as tobacco and alcohol use.      Peggy Kennedy's genetic test result does not clearly suggest that she has inherited an increased risk for developing a primary breast cancer.      When we are unable to identify a genetic cause for breast cancer in the family, we utilize computer models to help risk stratify unaffected individuals. The IBIS / Tyrer-Cuzick risk assessment model estimates that Peggy Kennedy  Peggy Kennedy has a:  1.47% risk of developing breast cancer by age 10 (in the next five years), and a  16.30% risk of developing breast cancer by age 75 (lifetime risk).      It is very important to keep in mind that this model (and others) gives approximate, rather than precise, estimates of breast cancer risk based on different risk factors and that each uses different data sets. The Tyrer-Cuzick model takes into account family history of breast cancer in  close relatives, current age, biopsy history, BRCA1/2 test result, childbirth history, height and weight, use of hormone replacement therapy, age at menarche, and menopause status. It is also important to note that Tyrer-Cuzick was developed and validated largely in non-Hispanic Caucasian women, and additional data is needed in more diverse populations to provide more accurate breast cancer risk estimations.      Additionally, this breast cancer risk estimation may change over time as certain risk factors evolve (such as parity, menopausal status, height and weight) and therefore the patient is encouraged to review any pertinent changes with her healthcare providers to determine whether this risk estimation should be re-calculated in the future.       It is important that Peggy Kennedy follows the screening regimen determined by her physicians, which may include annual clinical encounter (including ongoing risk assessment, risk reduction counseling, and preferably a clinical breast exam even in asymptomatic individuals when feasible), annual screening mammogram with tomosynthesis, as well as breast awareness. Individuals should be familiar with their breasts and promptly report changes to their health care provider. Known breast density should be factored into risk assessment if available from a prior mammogram.      Increased breast cancer screening may be a useful tool in women with elevated risk for breast cancer. Per the NCCN  guidelines (V2.2025), for women who have a residual lifetime breast cancer risk >20% as defined by models that include a comprehensive family history (including the IBIS/Tyrer-Cuzick model), they recommend annual breast MRI with and without contrast as an adjunct to annual screening mammogram with tomosynthesis.      As per NCCN guidelines, individuals whose residual lifetime breast cancer risk is between 15%-20% as defined by models that include a comprehensive family history Peggy Kennedy is 16.30%) may be considered for supplemental screening on an individual basis, depending on risk factors. Therefore, the patient can discuss with her physician(s) the benefits and limitations of increased breast cancer screening.      We discussed annual mammogram screening alternating every 6 months with breast MRI or whole breast US . We reviewed the advantages and disadvantages of breast MRI. She is interested in continuing with Breast MRI surveillance. She is due for a follow up left Breast Mammogram. I have ordered her annual Mammogram in April 2026 and we discussed MRI in October 2026.        COLON: The general population (average) lifetime risk for colorectal cancer is approximately ~4.5%. Studies have shown that a family history of colorectal cancer increases a person's risk.  The number of affected relatives, their degree of relationship and their age(s) at diagnosis should all be considered.    Peggy Kennedy reports no prior colorectal cancer surveillance or family history of colorectal cancer. The [employer] (NCCN), American Cancer Society and U.S. Chief Financial Officer (USPSTF) have recommended that individuals at average risk of colorectal cancer begin regular screening at the age of 96. When initiating screening for all eligible individuals, NCCN recommends a discussion of potential risks and benefits, and the consideration of all  recommended colorectal cancer screening options, including endoscopy-based screening (I.e. colonoscopy, flexible sigmoidoscopy), stool-based screening (a sensitive test that looks for signs of cancer in a person's stool), blood-based screening or image-based screening. We discussed screening to begin age 76 and to review her father's history at that time for possible EGD.      SKIN:  Because close relatives of individuals with melanoma  are at increased risk of developing melanoma, I recommended that Peggy Kennedy practice sun avoidance and undergo total body skin examination yearly, or more frequently at the recommendation of the treating dermatologist.     LIFESTYLE MODIFIERS: In addition to family history, lifestyle factors may also influence cancer risk. These include diet, exercise and exposure to tobacco and alcohol. It is therefore important that Peggy Kennedy maintain a well-balanced diet, try to exercise on a regular basis, limit alcohol consumption, and also avoid tobacco use.         Prevention/Lifestyle Interventions   INBODY: November 11, 2023   Not done today     Interested in referral to our [facility] dietitian. Will schedule consult. and Plans on continuing current diet and exercise regimen.      We also discussed the following lifestyle modifications in the prevention and risk reduction for the development of cancer:   - Maintain a healthy weight (healthy BMI 18.9-24.9). Studies show that excess body weight is directly associated with an increased risk of many types of cancer. We reviewed strategies for weight management.   - Be physically active and avoid prolonged sedentary behavior. There is strong evidence that higher levels of physical activity are linked to lower risk of several types of cancer. The American Heart Association recommends a minimum of 150 minutes per week of moderate-intensity physical activity or 75 minutes of vigorous physical  activity.  - Eat a diet rich in fruits, vegetables, whole grains, and beans. Plant based foods are high in nutrients, fiber and phytochemicals that may help prevent cancer.  - Limit consumption of red and processed meats. Eat no more than 12-16 oz of red meat per week. Eating more than 18 oz of red meat per week can increase the risk of colorectal cancer. In addition, some of the preservation methods used in producing processed meat have been shown to cause cancer in laboratory studies.   - Limit alcohol consumption. There is strong evidence that drinking alcohol is directly linked to breast, colorectal, esophageal, liver, oropharyngeal, and gastric cancers.   - Practice sun safety: -Use a sunscreen with an SPF of at least 30 that protects against UVA and UVB rays -Apply sunscreen generously and reapply every 2 hours or after swimming/excessive sweating. -Use physical barriers whenever possible (I.e. hats, long-sleeve shirts). -Avoid direct sun during peak hours.  -Do not use tanning beds.  -Avoid sunburns.    - Do not use or stop using cigarette/tobacco products.    Genetics   Testing completed as detailed above.      Clinical Trials   We discussed clinical trials being offered within the [facility].   Not eligible for any clinical trials at this time. Will reassess at future appointments     Psychosocial Needs   No identified psychosocial needs.     Health Maintenance/Screening     Mammogram: Date: 04/09/2023, The breasts are heterogeneously dense  Pap Smear: Current  Colonoscopy: no previous colorectal cancer screening  TBSE: Follows regularly with outside dermatologist      Follow Up/Referrals     - Left Mammogram ordered  - Bilateral Mammogram ordered for April 2026  - Referred to [facility]  - Follow up in 6 months    Peggy GORMAN Emmer, Peggy Kennedy      The following activities were performed on the date of service:  preparing to see the patient: - chart review   - review of prior labs  - review  of prior imaging  tests  - review of prior pathology reports  - review of consultant reports  - review of prior hospitalizations  - review of prior ED visits  obtaining and/or reviewing the separately obtained history  performing a medically appropriate examination and/or evaluation   counseling and educating the patient/family/caregiver  ordering medications, tests, or procedures  referring and communicating with other health care professionals (when not separately reported)   documenting clinical information in the electronic or other health records  independently interpreting results (not separately reported) and communicating results to the patient/family/caregiver  care coordination (not separately reported)  Total time spent performing activities on date of service:  60  minutes

## 2023-11-12 ENCOUNTER — Encounter (INDEPENDENT_AMBULATORY_CARE_PROVIDER_SITE_OTHER): Payer: Self-pay | Admitting: Internal Medicine

## 2023-11-12 DIAGNOSIS — R1084 Generalized abdominal pain: Secondary | ICD-10-CM

## 2023-11-15 ENCOUNTER — Telehealth: Payer: Self-pay | Admitting: Nurse Practitioner

## 2023-11-15 ENCOUNTER — Encounter: Payer: Self-pay | Admitting: Nurse Practitioner

## 2023-11-15 ENCOUNTER — Ambulatory Visit: Attending: Hematology & Oncology | Admitting: Nurse Practitioner

## 2023-11-15 VITALS — BP 106/69 | HR 76 | Temp 98.0°F | Ht 67.0 in | Wt 129.6 lb

## 2023-11-15 DIAGNOSIS — Z800 Family history of malignant neoplasm of digestive organs: Secondary | ICD-10-CM

## 2023-11-15 DIAGNOSIS — Z808 Family history of malignant neoplasm of other organs or systems: Secondary | ICD-10-CM

## 2023-11-15 DIAGNOSIS — N6320 Unspecified lump in the left breast, unspecified quadrant: Secondary | ICD-10-CM

## 2023-11-15 DIAGNOSIS — Z803 Family history of malignant neoplasm of breast: Secondary | ICD-10-CM

## 2023-11-15 NOTE — Telephone Encounter (Addendum)
-   Left Mammogram now - 10/21 at 12:30 pm at [facility] in [place] .       - Bilateral Mammogram April 2026 - 04/13/24 at 9:30 am at [facility] in [place].     [facility] - 12/17 at 11:00 am via video .       - Follow up in 6 months - 04/09 at 11:00 am.       ----- Message from Delon GORMAN Emmer, FNP sent at 11/15/2023  9:17 AM EDT -----  Regarding: appointments  Hello  Please schedule the following  - Left Mammogram now  - Bilateral Mammogram April 2026  - [facility]  - Follow up in 6 months    Thank you  Jen

## 2023-11-16 NOTE — Telephone Encounter (Signed)
 Called [facility] and was redirected to [facility]; was finally able to get through to their Neuroradiology reading room; spoke with representative Jasmine. Per Rolin, Dr. Chalmers (Radiologist who read the MRI) works remotely but is based in their [place] location. Per Rolin, she will email Dr. Chalmers to get clarification on the MRI report and will call this RN back.    [facility] Radiology dept phone: [phone]  [facility] Neuroradiology reading room phone: [phone]

## 2023-11-17 ENCOUNTER — Encounter (INDEPENDENT_AMBULATORY_CARE_PROVIDER_SITE_OTHER): Payer: Self-pay

## 2023-11-17 NOTE — Telephone Encounter (Signed)
 Spoke with Jasmine from [facility].   Per Rolin, Dr. Chalmers made the following addendum to the MRI report:     Addendum: June 2024 gallbladder ultrasound demonstrated gallstones. History provided indicates interval cholecystectomy. Therefore, what was felt to be gallstones on lumbar MRI likely was bowel contents.

## 2023-11-25 ENCOUNTER — Telehealth (INDEPENDENT_AMBULATORY_CARE_PROVIDER_SITE_OTHER): Admitting: Internal Medicine

## 2023-11-25 ENCOUNTER — Encounter (INDEPENDENT_AMBULATORY_CARE_PROVIDER_SITE_OTHER): Payer: Self-pay | Admitting: Internal Medicine

## 2023-11-25 DIAGNOSIS — R1011 Right upper quadrant pain: Secondary | ICD-10-CM

## 2023-11-25 DIAGNOSIS — Z9049 Acquired absence of other specified parts of digestive tract: Secondary | ICD-10-CM

## 2023-11-25 NOTE — Progress Notes (Signed)
 Have you seen any specialists/other providers since your last visit with us ?    No    The patient was informed that the following HM items are still outstanding:     Health Maintenance Due   Topic Date Due    Influenza Vaccine  09/10/2023

## 2023-11-25 NOTE — Progress Notes (Signed)
 [facility]   OFFICE VISIT            Telehealth:  The Patient has given verbal consent for delivery of health care via telehealth.   The patient is located at Home in [place]   The encounter provider is located at their Medical Office in [place]   Epic Video Client was utilized for Real Time/Synchronous Telehealth.   The time spent in medical discussion during this visit was 15 minutes.        HPI     Chief Complaint   Patient presents with    Cholelithiasis     Had recent MRI done 10/8 and showed gallstones  Has not been feeling well  Having n/v   Gallbladder removed July 2024        History of Present Illness  Peggy Kennedy Tashala Cumbo is a 43 year old female who presents with abdominal pain and nausea following an MRI that suggested gallstones.    She has experienced lower back pain for years, which was previously evaluated and considered potentially surgical. Recently, she consulted a spine specialist who recommended an MRI, performed on October 3rd.    Following the MRI, she experienced severe illness over the weekend and into Monday, with significant abdominal pain, tenderness on her side, nausea, vomiting, and diarrhea. These symptoms resolved after three to four days. Despite the resolution of acute symptoms, she continues to feel 'too full too soon' and experiences mild nausea, particularly at night after consuming greasy foods. She reports that her current symptoms remind her of when she had gallbladder issues in the past, although she no longer has a gallbladder.    The MRI results indicated the presence of gallstones, which was surprising given her history of cholecystectomy. She questions whether stones could be present in the bile ducts, causing her symptoms.    No current severe pain or fever. She reports previous episodes of dehydration during the acute illness.     ROS   Review of Systems see above     Vital Signs   LMP 11/02/2023 (Exact Date)  VIDEO    Physical Exam   Physical  Exam  Constitutional:       General: She is not in acute distress.     Appearance: She is well-developed.   Pulmonary:      Effort: Pulmonary effort is normal.   Skin:     Findings: No rash.   Neurological:      General: No focal deficit present.      Mental Status: She is alert.           Assessment/Plan     Assessment & Plan  RUQ pain    Orders:    US  Abdomen Limited Ruq; Future    History of cholecystectomy    Orders:    US  Abdomen Limited Ruq; Future      43 y.o. female presents for:     Assessment & Plan  Suspected choledocholithiasis  Symptoms and MRI suggest bile duct stones post-cholecystectomy. Differential includes viral gastroenteritis, but choledocholithiasis is possible.  - Order abdominal ultrasound for bile duct dilation.  - Refer to gastroenterology for ERCP if dilation present.  - Monitor symptoms; consider HIDA scan if symptoms persist or worsen without dilation.  - Advise ER visit for severe pain or fever.    Recording duration: 12 minutes    Risk & Benefits of the new medication(s) were explained to the patient (and family) who verbalized understanding & agreed  to the treatment plan. Patient (family) encouraged to contact me/clinical staff with any questions/concerns    Counseled on signs/symptoms to warrant re-evaluation    Follow up  prn or sooner as needed    Eva Roetta Shiley, MD     Verbal consent obtained to record this visit.

## 2023-11-29 ENCOUNTER — Ambulatory Visit: Admitting: Surgery

## 2023-11-30 ENCOUNTER — Other Ambulatory Visit: Payer: Self-pay | Admitting: Nurse Practitioner

## 2023-12-02 ENCOUNTER — Other Ambulatory Visit (INDEPENDENT_AMBULATORY_CARE_PROVIDER_SITE_OTHER): Payer: Self-pay | Admitting: Internal Medicine

## 2023-12-02 ENCOUNTER — Other Ambulatory Visit: Payer: Self-pay

## 2023-12-02 DIAGNOSIS — N6320 Unspecified lump in the left breast, unspecified quadrant: Secondary | ICD-10-CM

## 2023-12-02 DIAGNOSIS — Z803 Family history of malignant neoplasm of breast: Secondary | ICD-10-CM

## 2023-12-03 ENCOUNTER — Encounter (INDEPENDENT_AMBULATORY_CARE_PROVIDER_SITE_OTHER): Payer: Self-pay | Admitting: Internal Medicine

## 2023-12-03 DIAGNOSIS — M47816 Spondylosis without myelopathy or radiculopathy, lumbar region: Secondary | ICD-10-CM

## 2023-12-07 ENCOUNTER — Ambulatory Visit (INDEPENDENT_AMBULATORY_CARE_PROVIDER_SITE_OTHER): Payer: Self-pay | Admitting: Internal Medicine

## 2023-12-10 ENCOUNTER — Other Ambulatory Visit (INDEPENDENT_AMBULATORY_CARE_PROVIDER_SITE_OTHER): Payer: Self-pay | Admitting: Internal Medicine

## 2023-12-11 ENCOUNTER — Encounter (INDEPENDENT_AMBULATORY_CARE_PROVIDER_SITE_OTHER): Payer: Self-pay

## 2023-12-11 ENCOUNTER — Encounter (INDEPENDENT_AMBULATORY_CARE_PROVIDER_SITE_OTHER): Payer: Self-pay | Admitting: Orthopaedic Surgery of the Spine

## 2023-12-13 ENCOUNTER — Encounter (INDEPENDENT_AMBULATORY_CARE_PROVIDER_SITE_OTHER): Payer: Self-pay

## 2023-12-14 ENCOUNTER — Ambulatory Visit (INDEPENDENT_AMBULATORY_CARE_PROVIDER_SITE_OTHER)

## 2023-12-15 ENCOUNTER — Encounter (INDEPENDENT_AMBULATORY_CARE_PROVIDER_SITE_OTHER): Payer: Self-pay | Admitting: Internal Medicine

## 2023-12-20 ENCOUNTER — Other Ambulatory Visit (INDEPENDENT_AMBULATORY_CARE_PROVIDER_SITE_OTHER): Payer: Self-pay | Admitting: Internal Medicine

## 2023-12-20 ENCOUNTER — Ambulatory Visit (INDEPENDENT_AMBULATORY_CARE_PROVIDER_SITE_OTHER)

## 2023-12-20 ENCOUNTER — Encounter (INDEPENDENT_AMBULATORY_CARE_PROVIDER_SITE_OTHER): Payer: Self-pay

## 2023-12-20 DIAGNOSIS — B001 Herpesviral vesicular dermatitis: Secondary | ICD-10-CM

## 2023-12-21 NOTE — Telephone Encounter (Signed)
 Refill sent.

## 2023-12-21 NOTE — Telephone Encounter (Signed)
 Last filled September 2025.   Last o/v October 2025.  Patient has an upcoming appointment on December 29 2023.  Queued up in [place]

## 2023-12-24 ENCOUNTER — Encounter (INDEPENDENT_AMBULATORY_CARE_PROVIDER_SITE_OTHER): Payer: Self-pay | Admitting: Orthopaedic Surgery of the Spine

## 2023-12-24 ENCOUNTER — Ambulatory Visit (INDEPENDENT_AMBULATORY_CARE_PROVIDER_SITE_OTHER): Admitting: Orthopaedic Surgery of the Spine

## 2023-12-24 ENCOUNTER — Encounter (INDEPENDENT_AMBULATORY_CARE_PROVIDER_SITE_OTHER): Payer: Self-pay

## 2023-12-24 VITALS — Resp 18 | Ht 67.0 in | Wt 135.0 lb

## 2023-12-24 DIAGNOSIS — M4316 Spondylolisthesis, lumbar region: Secondary | ICD-10-CM

## 2023-12-24 DIAGNOSIS — M4726 Other spondylosis with radiculopathy, lumbar region: Secondary | ICD-10-CM

## 2023-12-24 DIAGNOSIS — M4727 Other spondylosis with radiculopathy, lumbosacral region: Secondary | ICD-10-CM

## 2023-12-24 DIAGNOSIS — M4306 Spondylolysis, lumbar region: Secondary | ICD-10-CM

## 2023-12-24 DIAGNOSIS — M79604 Pain in right leg: Secondary | ICD-10-CM

## 2023-12-24 NOTE — Addendum Note (Signed)
 Addended by: ROSALIA MINGS A on: 12/24/2023 09:21 AM     Modules accepted: Level of Service

## 2023-12-24 NOTE — Progress Notes (Addendum)
 [facility]         Subjective     History of Present Illness  Peggy Kennedy is a 43 year old female with lumbar spondylolisthesis, spondylolysis, and degenerative disc disease who presents for evaluation of persistent low back pain and right-sided radiculopathy.    She has experienced progressive low back pain for at least ten years, initially following participation in volleyball during youth, with worsening symptoms after two pregnancies. Her first lumbar MRI, performed approximately ten years ago, demonstrated spondylolisthesis and degenerative changes.    Pain is primarily localized to the lower back, described as 'bone on bone' and 'almost like a bruise.' The pain radiates predominantly to the right lower extremity, with occasional left-sided involvement during severe episodes. She reports chronic tenderness and constriction in the right buttock. She denies numbness, distal paresthesias, or bladder/bowel dysfunction. No shooting or electrical pain is present.    She has completed multiple courses of physical therapy focused on core strengthening and weight loss, but is not currently participating. She has received three to four lumbar epidural steroid injections without significant pain relief.    There is no family history of similar spinal conditions.    Review of Systems    Objective   Resp 18   Ht 1.702 m (5' 7)   Wt 61.2 kg (135 lb)   LMP  (LMP Unknown)   BMI 21.14 kg/m   Physical Exam  Physical Exam  MUSCULOSKELETAL: Hip non-tender, normal range of motion. Spine non-tender, no pain on manipulation.  NEUROLOGICAL: Sensation symmetric and intact. Cranial nerves grossly intact, moves all extremities without gross motor or sensory deficit.     Results  RADIOLOGY  Lumbar spine MRI: Degeneration at L5-S1 with spondylolisthesis        Assessment/Plan   .  1. Spondylolisthesis of lumbar region  Referral Vascular Surgery    CT Lumbar Spine WO  Contrast      2. Osteoarthritis of spine with radiculopathy, lumbosacral region  Referral Vascular Surgery    CT Lumbar Spine WO Contrast      3. Other spondylosis with radiculopathy, lumbar region  Referral Vascular Surgery    CT Lumbar Spine WO Contrast      4. Lumbar spondylolysis  Referral Vascular Surgery    CT Lumbar Spine WO Contrast      5. Right leg pain  Referral Vascular Surgery    CT Lumbar Spine WO Contrast        Discussed risk/benefits of ALIF L5/S1; informed consent info reviewed.  Assessment & Plan  Lumbar spondylolisthesis and spondylolysis with chronic low back pain and right leg radiculopathy  Chronic, progressive lumbar spondylolisthesis and spondylolysis at L5-S1 with degenerative disc disease, resulting in persistent low back pain and intermittent right leg radiculopathy. Symptoms have been refractory to conservative management (physical therapy, weight loss, multiple steroid injections). No neurological deficit or bowel/bladder dysfunction. Imaging demonstrates instability at L5-S1. Surgical stabilization and fusion are indicated for pain and functional limitation, with a relative indication given the absence of neurological compromise. An anterior approach is preferred due to body habitus and to minimize hardware prominence. Surgery is elective, with no urgency, and can be scheduled within six months. Risks, alternatives, and rationale for approach were discussed.  - Recommended anterior lumbar interbody fusion at L5-S1 for stabilization and pain relief.  - Discussed anterior, lateral, and posterior approach options; recommended anterior approach to minimize hardware prominence and muscle disruption.  - Explained need for vascular surgeon (  Dr. Gertie) for anterior approach; provided referral for preoperative evaluation.  - Reviewed expected recovery: 3 days initial convalescence (outpatient or overnight), 3 weeks to return to light duty work, 3 months for full activity resumption.  -  Discussed postoperative restrictions: no lifting >1 gallon of milk, no driving while on pain medications, stair climbing and walking permitted.  - Explained anticipated pain relief (80-90%), with possible residual symptoms due to chronicity.  - Discussed theoretical risk of adjacent segment degeneration (2% per year, up to 20-30% over 20 years), not necessarily requiring further surgery.  - Provided information for surgical scheduling and hospital location [facility]).    Right trochanteric bursitis  Chronic right buttock and lateral hip pain consistent with right trochanteric bursitis, likely secondary to altered biomechanics from longstanding lumbar pathology. Some pain is attributable to this condition and chronic constriction of the right buttock region. Lumbar fusion may not fully resolve bursitis symptoms; residual pain may require ongoing physiotherapy and stretching postoperatively.  - Discussed that a portion of her pain is due to trochanteric bursitis and chronic right buttock constriction.  - Explained that lumbar fusion may not fully resolve bursitis symptoms; ongoing physiotherapy and stretching may be needed postoperatively.    Verbal consent obtained to record this visit.

## 2023-12-27 ENCOUNTER — Ambulatory Visit (INDEPENDENT_AMBULATORY_CARE_PROVIDER_SITE_OTHER)

## 2023-12-27 DIAGNOSIS — Z23 Encounter for immunization: Secondary | ICD-10-CM

## 2023-12-27 NOTE — Progress Notes (Signed)
 Vaccination administration:  Patient presented to the office for the following vaccination administration:    Influenza given in the Left deltoid    No reactions were noted and patient left in good condition.

## 2023-12-29 ENCOUNTER — Encounter (INDEPENDENT_AMBULATORY_CARE_PROVIDER_SITE_OTHER): Admitting: Internal Medicine

## 2024-01-26 ENCOUNTER — Telehealth: Payer: Self-pay

## 2024-01-26 ENCOUNTER — Ambulatory Visit

## 2024-01-26 ENCOUNTER — Ambulatory Visit: Payer: Self-pay

## 2024-01-26 NOTE — Progress Notes (Signed)
 [facility]   Nutrition Education  [facility]      Date: 01/26/2024  Name: Peggy Kennedy  DOB: 09/15/1980  MRN: [id number]    Reason for Visit: Cancer risk reduction diet education  Referral Source: Delon Emmer, FNP    NUTRITION INTERVENTIONS / RECOMMENDATIONS     1.  Aim to consume a plant based diet.  -Recommend trying to make the focus of each meal plant based foods (2/3rds of each plate/bowl should be coming from plant foods).  -Recommend consuming at least 2 1/2 cups fruits and vegetables daily.  -Add in more beans, lentils, and chickpeas   -Include an additional serving of fruit with snack daily     2.  Recommend choosing whole grain carbohydrates (brown rice, quinoa, oats, barley, bulgur).   -Aim to make at least 1/2 of your grains be whole grains     3.  Limit sodium to <2,000 mg per day   -Be cautious with sauces, cheeses, broths, soups, and flavoring packets   -Look for items with <250 mg of sodium per serving     4.  Continue to limit red meat (beef, pork, lamb) consumption to <18oz/week; limit/avoid consumption of processed meats (bacon, hot dogs, deli meats).     5.  Aim for a goal of 150 minutes of moderately intense physical activity per week.   -Include resistance exercises 2-3 times per week      6.  Restart vitamin D3 supplementation with 1,000 IU/day    NUTRITION SUMMARY     Peggy Kennedy is a 43 y.o. female with a family history of cancer. Patient underwent left partial mastectomy on 06/14/23. Past medical history significant for hypertension and Hashimoto's disease.    Patient seen virtually today for nutrition counseling regarding cancer risk reduction. Current documented weight of 135 lbs with BMI of 21.    Patient provided education and handouts on evidence based recommendations from the [employer] for [employer] on diet and exercise to reduce risk of cancer  occurrence. Made an effort to put into appropriate context the way lifestyle choices such as diet and exercise can influence cancer risk, making clear that cancer is a complex disease with multiple causes including many factors outside of our control. Commended patient on her interest and efforts to make diet and lifestyle choices maximize health.    Patient reports intentional weight loss of ~20 lbs in the past several years. She notes weight can fluctuate regularly which she attributes to her Hashimoto's disease.     Reviewed the benefits of a plant-based diet for cancer risk reduction. Encouraged patient to consume a wide range of minimally processed plants to get a variety of beneficial phytochemicals. Explained that plant-based eating does not have to mean a fully vegan or vegetarian diet but instead means emphasizing mostly whole grains, fruit, vegetables, and legumes at meals/snacks. Discussed ways to incorporate more plant foods into patient's current diet. Patient reports liking most foods, she just needs to make more of a conscious effort to include plant foods.     Discussed the importance of a low sodium diet for heart health. Reviewed sodium recommendations including daily sodium limit of 2,000 mg. Provided examples of high sodium foods to limit (cheeses, breads, canned soups, condiments, salts, etc.).    Patient grew up in athletics and exercising regularly. Back pain has been limiting her activity more recently. She is thinking  about getting back to exercise with resistance exercises and rowing.     Patient is interested in coming in for body composition analysis-will set up appointment for Inbody on 02/17/24 at 7:45 am. Will send meal plan ideas per patient request.     Answered patient's nutrition-related questions.     Patient verbalizes good understanding of all information discussed, good compliance expected.    NUTRITION ASSESSMENT     Diet History: No known food allergies. Does not like milk,  does some Greek yogurt. Red meat occasionally, minimal processed meats. Patient had her gallbladder removed in 2024 and avoids high fat and fried foods. She avoids excessive food intake at meals. No whole grains currently.               Typical Diet:   Breakfast: toast with butter and berries  Lunch: salad with chicken OR chicken with rice and veggies/salad OR oatmeal with fruit OR sushi  Dinner: pasta with cheese and chicken OR meat/fish with salad  Snacks: granola, bars, snacks at work, candy  Beverages: black coffee (1-3 cups/day), coke zero (1 per day), water , no alcohol     Eating out: 2-4x/week     Physical Activity: Exercise limited by back pain for 15 years. Minimal exercise for the past 3-4 months. Used to play volleyball and do HIIT training. Pain with walking. Bought rower for home.     Wt Readings from Last 10 Encounters:   12/24/23 61.2 kg (135 lb)   11/15/23 58.8 kg (129 lb 10.1 oz)   06/30/23 59 kg (130 lb)   06/14/23 60.1 kg (132 lb 9.6 oz)   06/09/23 59 kg (130 lb)   06/04/23 63.5 kg (140 lb)   05/21/23 60.3 kg (133 lb)   05/19/23 61 kg (134 lb 8 oz)   05/10/23 63.5 kg (140 lb)   04/13/23 63.5 kg (140 lb)       Current Medications:  Current Outpatient Medications   Medication Instructions    drospirenone -ethinyl estradiol  (Syeda ) 3-0.03 MG per tablet 1 tablet, Oral, Daily    levothyroxine  (SYNTHROID ) 112 mcg, Oral, Daily    lisinopril  (ZESTRIL ) 10 mg, Oral, Daily    valACYclovir  (VALTREX ) 1000 MG tablet TAKE 2 TABLETS (2,000 MG) BY MOUTH 2 (TWO) TIMES DAILY FOR 2 DOSES       Vitamins/Supplements: None. Vitamin D  has been low in the past per patient.   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    Past Surgical History:  has a past surgical history that includes Cesarean section (02/2017); LAPAROSCOPIC, CHOLECYSTECTOMY (08/24/2022); EXCISION, SOFT TISSUE (Right, 05/10/2023); BIOPSY, BREAST, WIRE LOCALIZATION (Left, 06/14/2023); Cholecystectomy (08/2021); Abdominal surgery (08/2021); pr mastectomy partial (May 2025); and Breast biopsy (6 months).  _______________________________________________________________________    Handouts Provided:   [employer] Blueprint to [employer] Cancer  [employer]    Nutrition Diagnosis:   Food and nutrition-related knowledge deficit related to lack of prior counseling regarding nutrition recommendations for cancer prevention as evidenced by discussion with patient.  _______________________________________________________________________    Follow-Up: Prn; patient provided with contact information for any nutrition questions/concerns, or for follow-up appointment prn.     Larraine Jay, RDN, CSO  Oncology Dietitian  [facility]  [email or web address]  [phone]

## 2024-01-26 NOTE — Telephone Encounter (Addendum)
 Patient scheduled per request       ----- Message from Aquilla C sent at 01/26/2024 12:42 PM EST -----  Regarding: f/u for inbody  Hello, can you please schedule this patient for an in person follow up visit on 02/17/24 at 7:45 am on the [facility] schedule? She is coming in for an inbody scan.     Thanks!

## 2024-02-04 ENCOUNTER — Telehealth (INDEPENDENT_AMBULATORY_CARE_PROVIDER_SITE_OTHER): Payer: Self-pay | Admitting: Orthopaedic Surgery of the Spine

## 2024-02-04 NOTE — Telephone Encounter (Signed)
 Left vm to schedule surgery with Dr Rosalia cb# [phone]

## 2024-02-17 ENCOUNTER — Ambulatory Visit: Payer: Self-pay

## 2024-02-29 ENCOUNTER — Telehealth (INDEPENDENT_AMBULATORY_CARE_PROVIDER_SITE_OTHER): Payer: Self-pay | Admitting: Surgical Critical Care

## 2024-02-29 NOTE — Telephone Encounter (Signed)
 scheduled NP consult as per vasc referral

## 2024-03-06 ENCOUNTER — Encounter (INDEPENDENT_AMBULATORY_CARE_PROVIDER_SITE_OTHER): Payer: Self-pay | Admitting: Internal Medicine

## 2024-03-06 ENCOUNTER — Encounter (INDEPENDENT_AMBULATORY_CARE_PROVIDER_SITE_OTHER): Payer: Self-pay

## 2024-03-08 ENCOUNTER — Other Ambulatory Visit: Payer: Self-pay

## 2024-03-14 ENCOUNTER — Encounter (INDEPENDENT_AMBULATORY_CARE_PROVIDER_SITE_OTHER): Payer: Self-pay

## 2024-03-16 ENCOUNTER — Ambulatory Visit (INDEPENDENT_AMBULATORY_CARE_PROVIDER_SITE_OTHER): Admitting: Surgical Critical Care

## 2024-03-16 ENCOUNTER — Telehealth (INDEPENDENT_AMBULATORY_CARE_PROVIDER_SITE_OTHER): Payer: Self-pay

## 2024-03-16 ENCOUNTER — Encounter (INDEPENDENT_AMBULATORY_CARE_PROVIDER_SITE_OTHER): Payer: Self-pay

## 2024-03-16 ENCOUNTER — Encounter (INDEPENDENT_AMBULATORY_CARE_PROVIDER_SITE_OTHER): Payer: Self-pay | Admitting: Surgical Critical Care

## 2024-03-16 DIAGNOSIS — M4726 Other spondylosis with radiculopathy, lumbar region: Secondary | ICD-10-CM

## 2024-03-16 DIAGNOSIS — M4306 Spondylolysis, lumbar region: Secondary | ICD-10-CM

## 2024-03-16 DIAGNOSIS — M79604 Pain in right leg: Secondary | ICD-10-CM

## 2024-03-16 DIAGNOSIS — M4727 Other spondylosis with radiculopathy, lumbosacral region: Secondary | ICD-10-CM

## 2024-03-16 DIAGNOSIS — M4316 Spondylolisthesis, lumbar region: Secondary | ICD-10-CM

## 2024-03-16 NOTE — Telephone Encounter (Signed)
 Left vm to assist with scheduling spine surgery with Dr Bradd and Dr Rosalia. Call back number [phone]

## 2024-03-16 NOTE — Progress Notes (Signed)
 [facility]    Chief Complaint   Patient presents with    Consult (Initial)     Consult ALIF L5-S1 scheduled _______ w/Dr. Rosalia at [facility]         History of Present Illness     Peggy Kennedy Peggy Kennedy is a 44 y.o. female who presents with back pain and bilateral leg pain. The patient was evaluated by Dr.Siddiqui  who plans a lumbar interbody fusion at L5/S1.I was asked to provide the retroperitoneal approach to the lumbar spine.    Past Medical History     Medical History[1]  C section x2  Lap cholecystectomy  Allergies     Allergies[2]    Medications     Current Medications[3]      Review of Systems   No DVT or Leg Swelling  Constitutional: Negative for fevers and chills  Skin: No rash or lesions  Respiratory: Negative for cough, wheezing, or hemoptysis  Cardiovascular: Negative for chest pain and dyspnea  Gastrointestinal: Negative for abdominal pain, nausea, vomiting and diarrhea  Musculoskeletal: Chronic back pain.   Genitourinary: Negative for dysuria  All other systems were reviewed and are negative except what is stated in the HPI    Physical Exam     Vitals:    03/16/24 0911   BP: 128/88   Pulse: 84   Temp:    SpO2:        Body mass index is 21.62 kg/m.    General:  Patient appears their stated age, well-nourished.  Alert and in no apparent distress.  Lungs: Respiratory effort unlabored, chest expansion symmetric.  Cardiac: RRR, no carotid bruits, no JVD.   Abd: Soft, nondistended, nontender.  No guarding or rebound, No mid line pulsatile mass   Extremities:Full ROM, symmetric, warm. No lower extremity edema.  Pulses: Right: DP present, PT present  Pulses: Left:  DP present, PT present  Neuro: Good insight and judgment, oriented to person, place, and time, gross motor and sensory intact     Imaging     MRI from 11/12/23 was personally reviewed. There is favorable vascular anatomy at the lumbosacral spine level. The report suggests Degenerative Disc Disease and  Foraminal Stenosis  and spondylolisthesis of the lumbar spine.     Assessment and Plan     Assessment:  44 y.o. female with back pain and bilateral leg pain secondary to degenerative disc disease of lumbosacral spine, foraminal stenosis, and spondylolisthesis of the lumbar spine.     Plan:  Patient is a good candidate for ALIF L5/S1.    The rationale for and risks of my participation were discussed with the patient.    I hope this plan of care meets with your approval. Thank you for the opportunity to care for your patient.     Sincerely,     Fairy Groom, M.D.    [facility]  [address]   [address]  [place], [place] [address]  T [phone]         [1]   Past Medical History:  Diagnosis Date    Back pain     chronic for many years    Benign lipomatous neoplasm 6 months    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    during pregnancies- On enoxaparin 40 mg daily    Hypertension 2023    controlled on medication    Hypothyroid 2010    Hashimoto's    Multiple benign melanocytic nevi 02/18/2016  Post-operative nausea and vomiting     with gallbladder   [2] No Known Allergies  [3]   Current Outpatient Medications:     drospirenone -ethinyl estradiol  (Syeda ) 3-0.03 MG per tablet, Take 1 tablet by mouth daily, Disp: 84 tablet, Rfl: 3    levothyroxine  (SYNTHROID ) 112 MCG tablet, Take 1 tablet (112 mcg) by mouth daily, Disp: 100 tablet, Rfl: 3    lisinopril  (ZESTRIL ) 10 MG tablet, Take 1 tablet (10 mg) by mouth daily, Disp: 100 tablet, Rfl: 3    valACYclovir  (VALTREX ) 1000 MG tablet, TAKE 2 TABLETS (2,000 MG) BY MOUTH 2 (TWO) TIMES DAILY FOR 2 DOSES (Patient taking differently: as needed), Disp: 20 tablet, Rfl: 1

## 2024-05-18 ENCOUNTER — Ambulatory Visit: Admitting: Nurse Practitioner

## 2024-05-22 ENCOUNTER — Encounter (INDEPENDENT_AMBULATORY_CARE_PROVIDER_SITE_OTHER): Admitting: Internal Medicine
